# Patient Record
Sex: Male | Born: 1960 | Hispanic: Yes | Marital: Married | Smoking: Never smoker
Health system: Southern US, Community
[De-identification: ages and names within clinical notes are randomized; demographics above are authoritative.]

## PROBLEM LIST (undated history)

## (undated) DIAGNOSIS — G8929 Other chronic pain: Secondary | ICD-10-CM

## (undated) DIAGNOSIS — E875 Hyperkalemia: Secondary | ICD-10-CM

## (undated) DIAGNOSIS — E872 Acidosis: Secondary | ICD-10-CM

## (undated) DIAGNOSIS — D649 Anemia, unspecified: Secondary | ICD-10-CM

## (undated) DIAGNOSIS — Z862 Personal history of diseases of the blood and blood-forming organs and certain disorders involving the immune mechanism: Secondary | ICD-10-CM

## (undated) DIAGNOSIS — N186 End stage renal disease: Secondary | ICD-10-CM

## (undated) DIAGNOSIS — I129 Hypertensive chronic kidney disease with stage 1 through stage 4 chronic kidney disease, or unspecified chronic kidney disease: Secondary | ICD-10-CM

## (undated) DIAGNOSIS — N289 Disorder of kidney and ureter, unspecified: Secondary | ICD-10-CM

## (undated) DIAGNOSIS — N19 Unspecified kidney failure: Secondary | ICD-10-CM

## (undated) DIAGNOSIS — I10 Essential (primary) hypertension: Secondary | ICD-10-CM

## (undated) DIAGNOSIS — R81 Glycosuria: Secondary | ICD-10-CM

## (undated) HISTORY — PX: NEPHRECTOMY TRANSPLANTED ORGAN: SUR880

## (undated) HISTORY — DX: Anemia, unspecified: D64.9

---
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
## 2016-03-29 ENCOUNTER — Inpatient Hospital Stay (HOSPITAL_COMMUNITY)
Admission: EM | Admit: 2016-03-29 | Discharge: 2016-04-10 | DRG: 673 | Disposition: A | Discharge status: Home / Self Care | Payer: Medicaid Other | Attending: Internal Medicine | Admitting: Internal Medicine

## 2016-03-29 ENCOUNTER — Encounter (HOSPITAL_COMMUNITY): Payer: Self-pay | Admitting: *Deleted

## 2016-03-29 ENCOUNTER — Inpatient Hospital Stay (HOSPITAL_COMMUNITY): Payer: Medicaid Other

## 2016-03-29 ENCOUNTER — Emergency Department (HOSPITAL_COMMUNITY): Payer: Medicaid Other

## 2016-03-29 DIAGNOSIS — E611 Iron deficiency: Secondary | ICD-10-CM | POA: Diagnosis present

## 2016-03-29 DIAGNOSIS — Z452 Encounter for adjustment and management of vascular access device: Secondary | ICD-10-CM

## 2016-03-29 DIAGNOSIS — Z79899 Other long term (current) drug therapy: Secondary | ICD-10-CM | POA: Diagnosis not present

## 2016-03-29 DIAGNOSIS — N19 Unspecified kidney failure: Secondary | ICD-10-CM | POA: Diagnosis present

## 2016-03-29 DIAGNOSIS — Z862 Personal history of diseases of the blood and blood-forming organs and certain disorders involving the immune mechanism: Secondary | ICD-10-CM

## 2016-03-29 DIAGNOSIS — E872 Acidosis, unspecified: Secondary | ICD-10-CM

## 2016-03-29 DIAGNOSIS — Z992 Dependence on renal dialysis: Secondary | ICD-10-CM

## 2016-03-29 DIAGNOSIS — I10 Essential (primary) hypertension: Secondary | ICD-10-CM

## 2016-03-29 DIAGNOSIS — R55 Syncope and collapse: Secondary | ICD-10-CM | POA: Diagnosis not present

## 2016-03-29 DIAGNOSIS — N186 End stage renal disease: Secondary | ICD-10-CM | POA: Diagnosis present

## 2016-03-29 DIAGNOSIS — Z419 Encounter for procedure for purposes other than remedying health state, unspecified: Secondary | ICD-10-CM

## 2016-03-29 DIAGNOSIS — E875 Hyperkalemia: Secondary | ICD-10-CM | POA: Diagnosis present

## 2016-03-29 DIAGNOSIS — I959 Hypotension, unspecified: Secondary | ICD-10-CM | POA: Diagnosis not present

## 2016-03-29 DIAGNOSIS — I120 Hypertensive chronic kidney disease with stage 5 chronic kidney disease or end stage renal disease: Secondary | ICD-10-CM | POA: Diagnosis present

## 2016-03-29 DIAGNOSIS — N189 Chronic kidney disease, unspecified: Secondary | ICD-10-CM

## 2016-03-29 DIAGNOSIS — D631 Anemia in chronic kidney disease: Secondary | ICD-10-CM | POA: Diagnosis present

## 2016-03-29 DIAGNOSIS — N2581 Secondary hyperparathyroidism of renal origin: Secondary | ICD-10-CM | POA: Diagnosis present

## 2016-03-29 DIAGNOSIS — R569 Unspecified convulsions: Secondary | ICD-10-CM

## 2016-03-29 DIAGNOSIS — N185 Chronic kidney disease, stage 5: Secondary | ICD-10-CM | POA: Diagnosis not present

## 2016-03-29 DIAGNOSIS — N179 Acute kidney failure, unspecified: Secondary | ICD-10-CM

## 2016-03-29 HISTORY — DX: End stage renal disease: N18.6

## 2016-03-29 HISTORY — DX: Unspecified kidney failure: N19

## 2016-03-29 HISTORY — DX: Chronic kidney disease, unspecified: N18.9

## 2016-03-29 HISTORY — DX: Hyperkalemia: E87.5

## 2016-03-29 HISTORY — DX: Acidosis, unspecified: E87.20

## 2016-03-29 HISTORY — DX: Acidosis: E87.2

## 2016-03-29 HISTORY — DX: Personal history of diseases of the blood and blood-forming organs and certain disorders involving the immune mechanism: Z86.2

## 2016-03-29 HISTORY — DX: Disorder of kidney and ureter, unspecified: N28.9

## 2016-03-29 HISTORY — DX: Essential (primary) hypertension: I10

## 2016-03-29 LAB — COMPREHENSIVE METABOLIC PANEL
ALT: 21 U/L (ref 17–63)
AST: 22 U/L (ref 15–41)
Albumin: 2.8 g/dL — ABNORMAL LOW (ref 3.5–5.0)
Alkaline Phosphatase: 57 U/L (ref 38–126)
Anion gap: 10 (ref 5–15)
BUN: 144 mg/dL — ABNORMAL HIGH (ref 6–20)
CO2: 13 mmol/L — ABNORMAL LOW (ref 22–32)
Calcium: 8.3 mg/dL — ABNORMAL LOW (ref 8.9–10.3)
Chloride: 112 mmol/L — ABNORMAL HIGH (ref 101–111)
Creatinine, Ser: 11.49 mg/dL — ABNORMAL HIGH (ref 0.61–1.24)
GFR calc Af Amer: 5 mL/min — ABNORMAL LOW (ref >60)
GFR calc non Af Amer: 4 mL/min — ABNORMAL LOW (ref >60)
Glucose, Bld: 103 mg/dL — ABNORMAL HIGH (ref 65–99)
Potassium: >7.5 mmol/L (ref 3.5–5.1)
Sodium: 135 mmol/L (ref 135–145)
Total Bilirubin: 0.3 mg/dL (ref 0.3–1.2)
Total Protein: 6 g/dL — ABNORMAL LOW (ref 6.5–8.1)

## 2016-03-29 LAB — CBC WITH DIFFERENTIAL/PLATELET
Band Neutrophils: 15 %
Basophils Absolute: 0 10*3/uL (ref 0.0–0.1)
Basophils Relative: 0 %
Blasts: 0 %
Eosinophils Absolute: 0.2 10*3/uL (ref 0.0–0.7)
Eosinophils Relative: 2 %
HCT: 29.4 % — ABNORMAL LOW (ref 39.0–52.0)
Hemoglobin: 9.5 g/dL — ABNORMAL LOW (ref 13.0–17.0)
Lymphocytes Relative: 12 %
Lymphs Abs: 1 10*3/uL (ref 0.7–4.0)
MCH: 28.4 pg (ref 26.0–34.0)
MCHC: 32.3 g/dL (ref 30.0–36.0)
MCV: 88 fL (ref 78.0–100.0)
Metamyelocytes Relative: 0 %
Monocytes Absolute: 0.2 10*3/uL (ref 0.1–1.0)
Monocytes Relative: 2 %
Myelocytes: 0 %
Neutro Abs: 7.2 10*3/uL (ref 1.7–7.7)
Neutrophils Relative %: 68 %
Other: 1 %
Platelets: 247 10*3/uL (ref 150–400)
Promyelocytes Absolute: 0 %
RBC: 3.34 MIL/uL — ABNORMAL LOW (ref 4.22–5.81)
RDW: 16.1 % — ABNORMAL HIGH (ref 11.5–15.5)
WBC: 8.7 10*3/uL (ref 4.0–10.5)
nRBC: 0 /100 WBC

## 2016-03-29 LAB — I-STAT CHEM 8, ED
BUN: >140 mg/dL — ABNORMAL HIGH (ref 6–20)
Calcium, Ion: 1.33 mmol/L (ref 1.15–1.40)
Chloride: 118 mmol/L — ABNORMAL HIGH (ref 101–111)
Creatinine, Ser: 12.5 mg/dL — ABNORMAL HIGH (ref 0.61–1.24)
Glucose, Bld: 103 mg/dL — ABNORMAL HIGH (ref 65–99)
HCT: 26 % — ABNORMAL LOW (ref 39.0–52.0)
Hemoglobin: 8.8 g/dL — ABNORMAL LOW (ref 13.0–17.0)
Potassium: 7.4 mmol/L (ref 3.5–5.1)
Sodium: 136 mmol/L (ref 135–145)
TCO2: 13 mmol/L (ref 0–100)

## 2016-03-29 LAB — I-STAT CG4 LACTIC ACID, ED
Lactic Acid, Venous: 0.36 mmol/L — ABNORMAL LOW (ref 0.5–1.9)
Lactic Acid, Venous: 0.4 mmol/L — ABNORMAL LOW (ref 0.5–1.9)

## 2016-03-29 LAB — SODIUM, URINE, RANDOM: Sodium, Ur: 50 mmol/L

## 2016-03-29 LAB — CREATININE, URINE, RANDOM: Creatinine, Urine: 49.87 mg/dL

## 2016-03-29 MED ORDER — HEPARIN SODIUM (PORCINE) 1000 UNIT/ML IJ SOLN
2.4000 mL | Freq: Once | INTRAMUSCULAR | Status: AC
Start: 2016-03-29 — End: 2016-03-30
  Administered 2016-03-30: 2400 [IU] via INTRAVENOUS
  Filled 2016-03-29: qty 2.4

## 2016-03-29 MED ORDER — CALCIUM CARBONATE ANTACID 500 MG PO CHEW
1.0000 | CHEWABLE_TABLET | Freq: Every day | ORAL | Status: DC | PRN
  Administered 2016-03-31: 200 mg via ORAL
  Filled 2016-03-29 (×2): qty 1

## 2016-03-29 MED ORDER — DEXTROSE 50 % IV SOLN
1.0000 | Freq: Once | INTRAVENOUS | Status: AC
  Administered 2016-03-29: 50 mL via INTRAVENOUS
  Filled 2016-03-29: qty 50

## 2016-03-29 MED ORDER — SODIUM CHLORIDE 0.9 % IV SOLN: 100.0000 mL | INTRAVENOUS | Status: DC | PRN

## 2016-03-29 MED ORDER — ALTEPLASE 2 MG IJ SOLR: 2.0000 mg | Freq: Once | INTRAMUSCULAR | Status: DC | PRN

## 2016-03-29 MED ORDER — LIDOCAINE-PRILOCAINE 2.5-2.5 % EX CREA: 1.0000 "application " | TOPICAL_CREAM | CUTANEOUS | Status: DC | PRN

## 2016-03-29 MED ORDER — INSULIN ASPART 100 UNIT/ML IV SOLN
10.0000 [IU] | Freq: Once | INTRAVENOUS | Status: AC
  Administered 2016-03-29: 10 [IU] via INTRAVENOUS
  Filled 2016-03-29: qty 1

## 2016-03-29 MED ORDER — NIFEDIPINE ER 60 MG PO TB24
60.0000 mg | ORAL_TABLET | Freq: Every day | ORAL | Status: DC
  Administered 2016-03-30 – 2016-03-31 (×2): 60 mg via ORAL
  Filled 2016-03-29 (×3): qty 1

## 2016-03-29 MED ORDER — LIDOCAINE HCL (PF) 1 % IJ SOLN: 5.0000 mL | INTRAMUSCULAR | Status: DC | PRN

## 2016-03-29 MED ORDER — SODIUM POLYSTYRENE SULFONATE 15 GM/60ML PO SUSP
30.0000 g | ORAL | Status: AC
  Administered 2016-03-29: 30 g via ORAL
  Filled 2016-03-29: qty 120

## 2016-03-29 MED ORDER — ALBUTEROL SULFATE (2.5 MG/3ML) 0.083% IN NEBU
10.0000 mg | INHALATION_SOLUTION | Freq: Once | RESPIRATORY_TRACT | Status: AC
  Administered 2016-03-29: 10 mg via RESPIRATORY_TRACT
  Filled 2016-03-29: qty 12

## 2016-03-29 MED ORDER — HEPARIN SODIUM (PORCINE) 5000 UNIT/ML IJ SOLN
5000.0000 [IU] | Freq: Three times a day (TID) | INTRAMUSCULAR | Status: DC
  Administered 2016-03-30 – 2016-04-09 (×27): 5000 [IU] via SUBCUTANEOUS
  Filled 2016-03-29 (×24): qty 1

## 2016-03-29 MED ORDER — CARVEDILOL 6.25 MG PO TABS
6.2500 mg | ORAL_TABLET | Freq: Three times a day (TID) | ORAL | Status: DC
  Administered 2016-03-30 – 2016-03-31 (×4): 6.25 mg via ORAL
  Filled 2016-03-29 (×3): qty 1

## 2016-03-29 MED ORDER — PENTAFLUOROPROP-TETRAFLUOROETH EX AERO: 1.0000 "application " | INHALATION_SPRAY | CUTANEOUS | Status: DC | PRN

## 2016-03-29 MED ORDER — HEPARIN SODIUM (PORCINE) 1000 UNIT/ML DIALYSIS
1000.0000 [IU] | INTRAMUSCULAR | Status: DC | PRN
  Filled 2016-03-29: qty 1

## 2016-03-29 MED ORDER — FUROSEMIDE 10 MG/ML IJ SOLN
120.0000 mg | INTRAMUSCULAR | Status: AC
  Administered 2016-03-29: 120 mg via INTRAVENOUS
  Filled 2016-03-29: qty 12

## 2016-03-29 MED ORDER — SODIUM CHLORIDE 0.9 % IV SOLN
1.0000 g | Freq: Once | INTRAVENOUS | Status: AC
  Administered 2016-03-29: 1 g via INTRAVENOUS
  Filled 2016-03-29: qty 10

## 2016-03-29 NOTE — Consult Note (Signed)
[facility] Consult Note     Date: 03/29/2016                  Patient Name:  Dylan [name]  MRN: [id number]  DOB: 1961-04-30  Age / Sex: 55 y.o., male         PCP: No primary care provider on file.                 Service Requesting Consult: ED- Dr. Ron Parker                 Reason for Consult: Hyperkalemia and CKD            Chief Complaint: sore throat and cough  HPI: Pt is a 31M with a PMH significant for HTN and CKD GV who is now seen in consultation at the request of Dr. Ron Parker of the ED for evaluation and management of progressive CKD and hyperkalemia.  Pt is originally from [place] and is here on vacation with his family, arrived on Nov 20th planning to leave on Dec 7th.  [name] is a physician (pediatrician) and helps provide the history.  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  He had an acute illness in February (in [place]) with fever, vomiting, and diarrhea.  He went to the hospital and was told he needed dialysis.  He had two dialysis treatments and recovered enough renal function so that he didn't need permanent dialysis.  He was told to get a fistula placement but he wasn't able to find all of the supplies needed to schedule the surgery (pts need to find their own supplies for surgical/ medical procedures according to dtr).    He has been feeling well with no change in urination.  Two days ago he reported a cough and sore throat and so has been getting amoxicillin 500 mg daily.  He has noted no change in his urination.  Dtr has noted perhaps a small change in appetite and also noted that he has been a little more sleepy than usual.    Dtr is very concerned about him needing dialysis again.  She reports he had a seizure after his first dialysis treatment in [place].  She knows it was not a full session; she thinks it may have been a 30 minute session.    Pt does not report Ha, CP SOB n/v or diarrhea.  He gets EPO 40,000 u weekly, last  received yesterday.   Past Medical History:  Diagnosis Date  . Hypertension   . Renal disorder     History reviewed. No pertinent surgical history.  History reviewed. No pertinent family history. Social History:  reports that he has never smoked. He does not have any smokeless tobacco history on file. He reports that he does not drink alcohol or use drugs.  Allergies: No Known Allergies  MEDICATIONS: list reviewed with dtr Nifedipine 60 mg BID olmesartan 40 mg q AM Furosemide 20 mg q AM Carvedilol 6.25 mg TID TUMS with every meal EPO 40,000 u q week Amoxicillin 500 mg q day (started 2 days ago) Folic acid and vitamin supplements  (Not in a hospital admission)  Results for orders placed or performed during the hospital encounter of 03/29/16 (from the past 48 hour(s))  Comprehensive metabolic panel     Status: Abnormal   Collection Time: 03/29/16  4:25 PM  Result Value Ref Range   Sodium 135 135 - 145 mmol/L   Potassium >7.5 (  HH) 3.5 - 5.1 mmol/L    Comment: NO VISIBLE HEMOLYSIS CRITICAL RESULT CALLED TO, READ BACK BY AND VERIFIED WITH: J BLUE,RN 1712 03/29/16 D BRADLEY    Chloride 112 (H) 101 - 111 mmol/L   CO2 13 (L) 22 - 32 mmol/L   Glucose, Bld 103 (H) 65 - 99 mg/dL   BUN 144 (H) 6 - 20 mg/dL   Creatinine, Ser 11.49 (H) 0.61 - 1.24 mg/dL   Calcium 8.3 (L) 8.9 - 10.3 mg/dL   Total Protein 6.0 (L) 6.5 - 8.1 g/dL   Albumin 2.8 (L) 3.5 - 5.0 g/dL   AST 22 15 - 41 U/L   ALT 21 17 - 63 U/L   Alkaline Phosphatase 57 38 - 126 U/L   Total Bilirubin 0.3 0.3 - 1.2 mg/dL   GFR calc non Af Amer 4 (L) >60 mL/min   GFR calc Af Amer 5 (L) >60 mL/min    Comment: (NOTE) The eGFR has been calculated using the CKD EPI equation. This calculation has not been validated in all clinical situations. eGFR's persistently <60 mL/min signify possible Chronic Kidney Disease.    Anion gap 10 5 - 15  CBC with Differential     Status: Abnormal   Collection Time: 03/29/16  4:25 PM  Result  Value Ref Range   WBC 8.7 4.0 - 10.5 K/uL   RBC 3.34 (L) 4.22 - 5.81 MIL/uL   Hemoglobin 9.5 (L) 13.0 - 17.0 g/dL   HCT 29.4 (L) 39.0 - 52.0 %   MCV 88.0 78.0 - 100.0 fL   MCH 28.4 26.0 - 34.0 pg   MCHC 32.3 30.0 - 36.0 g/dL   RDW 16.1 (H) 11.5 - 15.5 %   Platelets 247 150 - 400 K/uL   Neutrophils Relative % 68 %   Lymphocytes Relative 12 %   Monocytes Relative 2 %   Eosinophils Relative 2 %   Basophils Relative 0 %   Band Neutrophils 15 %   Metamyelocytes Relative 0 %   Myelocytes 0 %   Promyelocytes Absolute 0 %   Blasts 0 %   nRBC 0 0 /100 WBC   Other 1 %   Neutro Abs 7.2 1.7 - 7.7 K/uL   Lymphs Abs 1.0 0.7 - 4.0 K/uL   Monocytes Absolute 0.2 0.1 - 1.0 K/uL   Eosinophils Absolute 0.2 0.0 - 0.7 K/uL   Basophils Absolute 0.0 0.0 - 0.1 K/uL   RBC Morphology POLYCHROMASIA PRESENT     Comment: BURR CELLS ELLIPTOCYTES    WBC Morphology PELGEROID CHANGES   I-Stat CG4 Lactic Acid, ED     Status: Abnormal   Collection Time: 03/29/16  4:35 PM  Result Value Ref Range   Lactic Acid, Venous 0.36 (L) 0.5 - 1.9 mmol/L  I-Stat Chem 8, ED     Status: Abnormal   Collection Time: 03/29/16  6:34 PM  Result Value Ref Range   Sodium 136 135 - 145 mmol/L   Potassium 7.4 (HH) 3.5 - 5.1 mmol/L   Chloride 118 (H) 101 - 111 mmol/L   BUN >140 (H) 6 - 20 mg/dL   Creatinine, Ser 12.50 (H) 0.61 - 1.24 mg/dL   Glucose, Bld 103 (H) 65 - 99 mg/dL   Calcium, Ion 1.33 1.15 - 1.40 mmol/L   TCO2 13 0 - 100 mmol/L   Hemoglobin 8.8 (L) 13.0 - 17.0 g/dL   HCT 26.0 (L) 39.0 - 52.0 %   Comment NOTIFIED PHYSICIAN   Creatinine, urine, random  Status: None   Collection Time: 03/29/16  6:39 PM  Result Value Ref Range   Creatinine, Urine 49.87 mg/dL  Sodium, urine, random     Status: None   Collection Time: 03/29/16  6:39 PM  Result Value Ref Range   Sodium, Ur 50 mmol/L   Dg Chest 2 View  Result Date: 03/29/2016 CLINICAL DATA:  Cough, fatigue, loss of appetite and fever for 3 days, history  hypertension, renal disorder EXAM: CHEST  2 VIEW COMPARISON:  None FINDINGS: Enlargement of cardiac silhouette. Mediastinal contours and pulmonary vascularity normal. Minimal peribronchial thickening. No pulmonary infiltrate, pleural effusion, or pneumothorax. Osseous structures unremarkable. IMPRESSION: Enlargement of cardiac silhouette. Minimal bronchitic change without infiltrate. Electronically Signed   By: Lavonia Dana M.D.   On: 03/29/2016 16:58    ROS: all other systems reviewed and are negative except as per HPI  Blood pressure 144/82, pulse 78, temperature 98.2 F (36.8 C), temperature source Oral, resp. rate 18, weight 88.5 kg (195 lb), SpO2 99 %. Physical Exam  GEN: NAD, appears stated age 37: EOMI, PERRL, mucous membranes well-hydrated, some erythematous posterior oropharynx NECK: Supple, + JVD PULM: normal work of breathing, bilateral basilar inspiratory crackles, L > R with some L-sided rhonchi as well.   CV RRR, II/VI systolic murmur, no r/g ABD soft, nontender, no organomegaly appreciated EXT trace LE edema NEURO: AAO x 3, no asterixis   Assessment/Plan Pt is a 71M with a PMH significant for HTN and CKD GV now seen for progressive CKD and hyperkalemia.     1.  Hyperkalemia: K > 7.5, came down to 7.4 (iStat) after calcium, insulin/dextrose, and continuous albuterol neb.  Unfortunately pt was still on ARB.  Wonder if acute illness precipitated event vs progression of CKD.  Giving IV Lasix and Kayexelate, recheck potassium at 2300.  I am concerned about dialysis disequilibrium and precipitating another seizure since he had one after his first dialysis session last time--> do not know the exact circumstances.  Will try to augment medical management--> induce kaliuresis with Lasix and eliminate it through the stool with Kayexelate so when we do have to dialyze, time on machine for first session (and therefore risk of dialysis disequilibrium) is minimized.  CCM c/s for HD cath  placement, appreciate assistance.  2.  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  I believe that perhaps his appetite loss and sleepiness are in fact mild uremic symptoms which were exacerbated by sore throat/ fevers.  Has essentially progressed to ESRD.  Will need vein mapping (ordered), fistula placement, and permcath--> will be logistically tricky getting him an HD center at home.    3.  BMMD: takes TUMS with each meal- unsure if extra strength.  Adding on phos and PTH.  4.  Anemia: Hgb 9.5, received EPO 40,000 u yesterday.  Adding on iron and ferritin.  5.  Fevers/ sore throat: ? Strep, getting amoxicillin.  Consider throat culture, does not appear to be infiltrate on CXR  6.  HTN: on carvedilol 6.25 TID (confirmed with dtr), nifedipine 60 mg BID, Lasix 20 mg daily, and olmesartan.  Do not recommend restarting olmesartan. BP OK here, 144/82    Madelon Lips MD [facility] pgr [phone] 03/29/2016, 7:31 PM

## 2016-03-29 NOTE — ED Provider Notes (Signed)
[facility] DEPT Provider Note   CSN: [id number] Arrival date & time: 03/29/16  1602     History   Chief Complaint Chief Complaint  Patient presents with  . Fever  . Cough    HPI Dylan [name] is a 55 y.o. male.   Illness  This is a new problem. The current episode started 2 days ago. The problem occurs constantly. The problem has not changed since onset.Associated symptoms include shortness of breath. Pertinent negatives include no chest pain, no abdominal pain and no headaches. Associated symptoms comments: Coughing and fatigue. The symptoms are aggravated by exertion and coughing. Nothing relieves the symptoms. He has tried nothing for the symptoms. The treatment provided no relief.    Past Medical History:  Diagnosis Date  . Hypertension   . Renal disorder     Patient Active Problem List   Diagnosis Date Noted  . Hyperkalemia, diminished renal excretion 03/29/2016  . ESRD (end stage renal disease) ([place]) 03/29/2016  . Uremia of renal origin 03/29/2016  . HTN (hypertension) 03/29/2016  . 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 History of anemia due to CKD 03/29/2016    History reviewed. No pertinent surgical history.     Home Medications    Prior to Admission medications   Medication Sig Start Date End Date Taking? Authorizing Provider  calcium carbonate (TUMS - DOSED IN MG ELEMENTAL CALCIUM) 500 MG chewable tablet Chew 1 tablet by mouth daily as needed for indigestion or heartburn.   Yes Historical Provider, MD  carvedilol (COREG) 6.25 MG tablet Take 6.25 mg by mouth 3 (three) times daily with meals.   Yes Historical Provider, MD  epoetin alfa (EPOGEN,PROCRIT) 4000 UNIT/ML injection Inject 4,000 Units into the skin once a week. TUESDAYS   Yes Historical Provider, MD  furosemide (LASIX) 20 MG tablet Take 20 mg by mouth.   Yes Historical Provider, MD  NIFEdipine (PROCARDIA-XL/ADALAT CC) 60 MG 24 hr tablet Take 60 mg by mouth  daily.   Yes Historical Provider, MD  NON FORMULARY Take 2 capsules by mouth 2 (two) times daily.   Yes Historical Provider, MD    Family History History reviewed. No pertinent family history.  Social History Social History  Substance Use Topics  . Smoking status: Never Smoker  . Smokeless tobacco: Not on file  . Alcohol use No     Allergies   Patient has no known allergies.   Review of Systems Review of Systems  Constitutional: Positive for chills, fatigue and fever.  HENT: Negative for ear pain and sore throat.   Eyes: Negative for pain and visual disturbance.  Respiratory: Positive for cough and shortness of breath.   Cardiovascular: Negative for chest pain and palpitations.  Gastrointestinal: Negative for abdominal pain and vomiting.  Genitourinary: Negative for dysuria and hematuria.  Musculoskeletal: Negative for arthralgias and back pain.  Skin: Negative for color change and rash.  Neurological: Negative for seizures, syncope and headaches.  All other systems reviewed and are negative.    Physical Exam Updated Vital Signs BP 158/75   Pulse 97   Temp 98.2 F (36.8 C) (Oral)   Resp 19   Wt 88.5 kg   SpO2 100%   Physical Exam  Constitutional: He appears well-developed and well-nourished.  HENT:  Head: Normocephalic and atraumatic.  Eyes: Conjunctivae are normal.  Neck: Neck supple.  Cardiovascular: Normal rate and regular rhythm.   No murmur heard. Pulmonary/Chest: Effort normal. No respiratory distress. He has wheezes (diffuse). He  has no rales. He exhibits no tenderness.  Abdominal: Soft. There is no tenderness.  Musculoskeletal: Normal range of motion. He exhibits no edema or deformity.  Neurological: He is alert.  Skin: Skin is warm and dry. Capillary refill takes less than 2 seconds.  Psychiatric: He has a normal mood and affect.  Nursing note and vitals reviewed.    ED Treatments / Results  Labs (all labs ordered are listed, but only  abnormal results are displayed) Labs Reviewed  COMPREHENSIVE METABOLIC PANEL - Abnormal; Notable for the following:       Result Value   Potassium >7.5 (*)    Chloride 112 (*)    CO2 13 (*)    Glucose, Bld 103 (*)    BUN 144 (*)    Creatinine, Ser 11.49 (*)    Calcium 8.3 (*)    Total Protein 6.0 (*)    Albumin 2.8 (*)    GFR calc non Af Amer 4 (*)    GFR calc Af Amer 5 (*)    All other components within normal limits  CBC WITH DIFFERENTIAL/PLATELET - Abnormal; Notable for the following:    RBC 3.34 (*)    Hemoglobin 9.5 (*)    HCT 29.4 (*)    RDW 16.1 (*)    All other components within normal limits  I-STAT CG4 LACTIC ACID, ED - Abnormal; Notable for the following:    Lactic Acid, Venous 0.36 (*)    All other components within normal limits  I-STAT CHEM 8, ED - Abnormal; Notable for the following:    Potassium 7.4 (*)    Chloride 118 (*)    BUN >140 (*)    Creatinine, Ser 12.50 (*)    Glucose, Bld 103 (*)    Hemoglobin 8.8 (*)    HCT 26.0 (*)    All other components within normal limits  I-STAT CG4 LACTIC ACID, ED - Abnormal; Notable for the following:    Lactic Acid, Venous 0.40 (*)    All other components within normal limits  RAPID STREP SCREEN (NOT AT [facility])  CREATININE, URINE, RANDOM  SODIUM, URINE, RANDOM  BASIC METABOLIC PANEL  PARATHYROID HORMONE, INTACT (NO CA)  PHOSPHORUS  IRON AND TIBC  FERRITIN  HEPATITIS B SURFACE ANTIBODY  HEPATITIS B SURFACE ANTIGEN  HEPATITIS B CORE ANTIBODY, TOTAL    EKG  EKG Interpretation  Date/Time:  Wednesday March 29 2016 18:16:21 EST Ventricular Rate:  90 PR Interval:    QRS Duration: 96 QT Interval:  338 QTC Calculation: 414 R Axis:   -6 Text Interpretation:  Sinus rhythm Probable left atrial enlargement Minimal ST elevation, anterior leads Peaked T waves Abnormal ECG Confirmed by Sherry Ruffing MD, [place] [phone]) on 03/29/2016 11:14:50 PM       Radiology Dg Chest 2 View  Result Date: 03/29/2016 CLINICAL  DATA:  Cough, fatigue, loss of appetite and fever for 3 days, history hypertension, renal disorder EXAM: CHEST  2 VIEW COMPARISON:  None FINDINGS: Enlargement of cardiac silhouette. Mediastinal contours and pulmonary vascularity normal. Minimal peribronchial thickening. No pulmonary infiltrate, pleural effusion, or pneumothorax. Osseous structures unremarkable. IMPRESSION: Enlargement of cardiac silhouette. Minimal bronchitic change without infiltrate. Electronically Signed   By: Lavonia Dana M.D.   On: 03/29/2016 16:58   Dg Chest Port 1 View  Result Date: 03/29/2016 CLINICAL DATA:  Central line placement.  Initial encounter. EXAM: PORTABLE CHEST 1 VIEW COMPARISON:  Chest radiograph performed earlier today at 4:36 p.m. FINDINGS: The patient's right IJ line is noted  ending about the mid SVC. The lungs are well-aerated. Pulmonary vascularity is at the upper limits of normal. There is no evidence of focal opacification, pleural effusion or pneumothorax. The cardiomediastinal silhouette is borderline normal in size. No acute osseous abnormalities are seen. IMPRESSION: 1. Right IJ line noted ending about the mid SVC. 2. No acute cardiopulmonary process seen. Electronically Signed   By: Garald Balding M.D.   On: 03/29/2016 22:39    Procedures Procedures (including critical care time)  Medications Ordered in ED Medications  pentafluoroprop-tetrafluoroeth (GEBAUERS) aerosol 1 application (not administered)  lidocaine (PF) (XYLOCAINE) 1 % injection 5 mL (not administered)  lidocaine-prilocaine (EMLA) cream 1 application (not administered)  0.9 %  sodium chloride infusion (not administered)  0.9 %  sodium chloride infusion (not administered)  heparin injection 1,000 Units (not administered)  alteplase (CATHFLO ACTIVASE) injection 2 mg (not administered)  heparin injection 5,000 Units (not administered)  NIFEdipine (PROCARDIA-XL/ADALAT CC) 24 hr tablet 60 mg (not administered)  carvedilol (COREG) tablet  6.25 mg (not administered)  calcium carbonate (TUMS - dosed in mg elemental calcium) chewable tablet 200 mg of elemental calcium (not administered)  heparin injection 2,400 Units (not administered)  calcium chloride 1 g in sodium chloride 0.9 % 100 mL IVPB (0 g Intravenous Stopped 03/29/16 1809)  albuterol (PROVENTIL) (2.5 MG/3ML) 0.083% nebulizer solution 10 mg (10 mg Nebulization Given 03/29/16 1740)  insulin aspart (novoLOG) injection 10 Units (10 Units Intravenous Given 03/29/16 1752)  dextrose 50 % solution 50 mL (50 mLs Intravenous Given 03/29/16 1752)  furosemide (LASIX) 120 mg in dextrose 5 % 50 mL IVPB (0 mg Intravenous Stopped 03/29/16 2311)  sodium polystyrene (KAYEXALATE) 15 GM/60ML suspension 30 g (30 g Oral Given 03/29/16 2205)     Initial Impression / Assessment and Plan / ED Course  I have reviewed the triage vital signs and the nursing notes.  Pertinent labs & imaging results that were available during my care of the patient were reviewed by me and considered in my medical decision making (see chart for details).  Clinical Course    55 year old gentleman comes today with complaint of weakness diffusely fevers chills or URI type symptoms for last few days. He has a history of CK 85 having had acute renal failure in the past. Today he has EKG that shows peak T waves and serum potassium that is greater than 7.5. He is immediately brought back IV access was obtained and patient is given IV calcium IV glucose and insulin as well as albuterol. Nephrology is consulted for acute on chronic renal failure possible need for dialysis. Of note the patient's denying any shortness of breath or chest pain at this time. Initial troponin is negative chest x-ray is unremarkable for any acute cardiopulmonary pathology. Nephrology's recommendation is for IV Lasix and by mouth Kayexalate. He will be admitted to stepdown unit for further management of his hyperkalemia in the setting of acute on chronic  renal failure. Vital signs stable time handoff. Further made in this patient's care please see inpatient team notes.  Final Clinical Impressions(s) / ED Diagnoses   Final diagnoses:  Hyperkalemia  Acute renal failure, unspecified acute renal failure type [facility])    New Prescriptions New Prescriptions   No medications on file     Dewaine Conger, MD 03/29/16 [place], MD 03/30/16 0973

## 2016-03-29 NOTE — Procedures (Signed)
Hemodialysis Insertion Procedure Note Dylan [name] [id number] 12/13/1960  Procedure: Insertion of Hemodialysis Catheter Type: 3 port  Indications: Hemodialysis   Procedure Details Consent: Risks of procedure as well as the alternatives and risks of each were explained to the (patient/caregiver).  Consent for procedure obtained. From daughter who is a physician in [place] and only [place] speaking member of family. She informed her father who was had this done once before. Time Out: Verified patient identification, verified procedure, site/side was marked, verified correct patient position, special equipment/implants available, medications/allergies/relevent history reviewed, required imaging and test results available.  Performed  Maximum sterile technique was used including antiseptics, cap, gloves, gown, hand hygiene, mask and sheet. Skin prep: Chlorhexidine; local anesthetic administered A antimicrobial bonded/coated triple lumen catheter was placed in the right internal jugular vein using the Seldinger technique. Ultrasound guidance used.Yes.   Catheter placed to 16 cm. Blood aspirated via all 3 ports and then flushed x 3. Line sutured x 2 and dressing applied.  Evaluation Blood flow good Complications: No apparent complications Patient did tolerate procedure well. Chest X-ray ordered to verify placement.  CXR: pending.  Georgann Housekeeper, AGACNP-BC [facility] Pulmonology/Critical Care Pager [phone] or [phone]  03/29/2016 10:05 PM

## 2016-03-29 NOTE — ED Notes (Signed)
Potassium called 7.5 by lab. Tyrone Nine MD notified

## 2016-03-29 NOTE — ED Triage Notes (Signed)
Pt and family member reports pt having fever, sore throat, fatigue and cough since Sunday night.

## 2016-03-29 NOTE — ED Notes (Signed)
EKG given to Dr Tegeler. 

## 2016-03-29 NOTE — H&P (Signed)
History and Physical    Dylan [name] VFI:[id number] DOB: November 27, 1960 DOA: 03/29/2016   PCP: No primary care provider on file. Chief Complaint:  Chief Complaint  Patient presents with  . Fever  . Cough    HPI: Dylan [name] is a 55 y.o. male with medical history significant of HTN, and what sounds like CKD stage 5 at baseline, has had dialysis 2x previously in [place] but was not put on chronic dialysis.  He is here on vacation with family.  Arrived on Nov 20th and planning to leave Dec 7th although his [name] is for 6 months (can legally remain in [place] for that duration).  [name] is a physician (pediatrician) and helps provide the history.  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  He had an acute illness in February (in [place]) with fever, vomiting, and diarrhea.  He went to the hospital and was told he needed dialysis.  He had two dialysis treatments and recovered enough renal function so that he didn't need permanent dialysis.  He was told to get a fistula placement but he wasn't able to find all of the supplies needed to schedule the surgery (pts need to find their own supplies for surgical/ medical procedures according to dtr).    He has been feeling well with no change in urination.  Two days ago he reported a cough and sore throat and so has been getting amoxicillin 500 mg daily.  He has noted no change in his urination.  Dtr has noted perhaps a small change in appetite and also noted that he has been a little more sleepy than usual.    Dtr is very concerned about him needing dialysis again.  She reports he had a seizure after his first dialysis treatment in [place].  She knows it was not a full session; she thinks it may have been a 30 minute session.    Pt does not report Ha, CP SOB n/v or diarrhea.  He gets EPO 40,000 u weekly, last received yesterday.  On further discussion with daughter and family, they acknowledge that due to current financial / social /  political situation in [place], getting dialysis routinely there isnt really feasible.  ED Course: K > 7.5, came down to 7.4 with treatment.  Nephrology evaluating at bedside in ED.  BUN 144.  Creat 12.5  Review of Systems: As per HPI otherwise 10 point review of systems negative.    Past Medical History:  Diagnosis Date  . Hypertension   . Renal disorder     History reviewed. No pertinent surgical history.   reports that he has never smoked. He does not have any smokeless tobacco history on file. He reports that he does not drink alcohol or use drugs.  No Known Allergies  History reviewed. No pertinent family history. No h/o ESRD.   Prior to Admission medications   Medication Sig Start Date End Date Taking? Authorizing Provider  calcium carbonate (TUMS - DOSED IN MG ELEMENTAL CALCIUM) 500 MG chewable tablet Chew 1 tablet by mouth daily as needed for indigestion or heartburn.   Yes Historical Provider, MD  carvedilol (COREG) 6.25 MG tablet Take 6.25 mg by mouth 3 (three) times daily with meals.   Yes Historical Provider, MD  epoetin alfa (EPOGEN,PROCRIT) 4000 UNIT/ML injection Inject 4,000 Units into the skin once a week. TUESDAYS   Yes Historical Provider, MD  furosemide (LASIX) 20 MG tablet Take 20 mg by mouth.   Yes Historical  Provider, MD  NIFEdipine (PROCARDIA-XL/ADALAT CC) 60 MG 24 hr tablet Take 60 mg by mouth daily.   Yes Historical Provider, MD  NON FORMULARY Take 2 capsules by mouth 2 (two) times daily.   Yes Historical Provider, MD    Physical Exam: Vitals:   03/29/16 1740 03/29/16 1900 03/29/16 1915 03/29/16 1930  BP:  167/89 156/85 160/91  Pulse:  92 87 88  Resp:  21 17 19   Temp:      TempSrc:      SpO2: 99% 99% 97% 99%  Weight:          Constitutional: NAD, calm, comfortable Eyes: PERRL, lids and conjunctivae normal ENMT: Mucous membranes are moist. Posterior pharynx clear of any exudate or lesions.Normal dentition.  Neck: normal, supple, no masses, no  thyromegaly Respiratory: clear to auscultation bilaterally, no wheezing, no crackles. Normal respiratory effort. No accessory muscle use.  Cardiovascular: Regular rate and rhythm, no murmurs / rubs / gallops. No extremity edema. 2+ pedal pulses. No carotid bruits.  Abdomen: no tenderness, no masses palpated. No hepatosplenomegaly. Bowel sounds positive.  Musculoskeletal: no clubbing / cyanosis. No joint deformity upper and lower extremities. Good ROM, no contractures. Normal muscle tone.  Skin: no rashes, lesions, ulcers. No induration Neurologic: CN 2-12 grossly intact. Sensation intact, DTR normal. Strength 5/5 in all 4.  Psychiatric: Normal judgment and insight. Alert and oriented x 3. Normal mood.    Labs on Admission: I have personally reviewed following labs and imaging studies  CBC:  Recent Labs Lab 03/29/16 1625 03/29/16 1834  WBC 8.7  --   NEUTROABS 7.2  --   HGB 9.5* 8.8*  HCT 29.4* 26.0*  MCV 88.0  --   PLT 247  --    Basic Metabolic Panel:  Recent Labs Lab 03/29/16 1625 03/29/16 1834  NA 135 136  K >7.5* 7.4*  CL 112* 118*  CO2 13*  --   GLUCOSE 103* 103*  BUN 144* >140*  CREATININE 11.49* 12.50*  CALCIUM 8.3*  --    GFR: CrCl cannot be calculated (Unknown ideal weight.). Liver Function Tests:  Recent Labs Lab 03/29/16 1625  AST 22  ALT 21  ALKPHOS 57  BILITOT 0.3  PROT 6.0*  ALBUMIN 2.8*   No results for input(s): LIPASE, AMYLASE in the last 168 hours. No results for input(s): AMMONIA in the last 168 hours. Coagulation Profile: No results for input(s): INR, PROTIME in the last 168 hours. Cardiac Enzymes: No results for input(s): CKTOTAL, CKMB, CKMBINDEX, TROPONINI in the last 168 hours. BNP (last 3 results) No results for input(s): PROBNP in the last 8760 hours. HbA1C: No results for input(s): HGBA1C in the last 72 hours. CBG: No results for input(s): GLUCAP in the last 168 hours. Lipid Profile: No results for input(s): CHOL, HDL,  LDLCALC, TRIG, CHOLHDL, LDLDIRECT in the last 72 hours. Thyroid Function Tests: No results for input(s): TSH, T4TOTAL, FREET4, T3FREE, THYROIDAB in the last 72 hours. Anemia Panel: No results for input(s): VITAMINB12, FOLATE, FERRITIN, TIBC, IRON, RETICCTPCT in the last 72 hours. Urine analysis: No results found for: COLORURINE, APPEARANCEUR, LABSPEC, PHURINE, GLUCOSEU, HGBUR, BILIRUBINUR, KETONESUR, PROTEINUR, UROBILINOGEN, NITRITE, LEUKOCYTESUR Sepsis Labs: @LABRCNTIP (procalcitonin:4,lacticidven:4) )No results found for this or any previous visit (from the past 240 hour(s)).   Radiological Exams on Admission: Dg Chest 2 View  Result Date: 03/29/2016 CLINICAL DATA:  Cough, fatigue, loss of appetite and fever for 3 days, history hypertension, renal disorder EXAM: CHEST  2 VIEW COMPARISON:  None FINDINGS: Enlargement of  cardiac silhouette. Mediastinal contours and pulmonary vascularity normal. Minimal peribronchial thickening. No pulmonary infiltrate, pleural effusion, or pneumothorax. Osseous structures unremarkable. IMPRESSION: Enlargement of cardiac silhouette. Minimal bronchitic change without infiltrate. Electronically Signed   By: Lavonia Dana M.D.   On: 03/29/2016 16:58    EKG: Independently reviewed.  Assessment/Plan Principal Problem:   Hyperkalemia, diminished renal excretion Active Problems:   ESRD (end stage renal disease) (HCC)   Uremia of renal origin   HTN (hypertension)   Metabolic acidosis, NAG, failure of bicarbonate regeneration   History of anemia due to CKD    1. Hyperkalemia, uremia, and ESRD - 1. Getting kayexelate and lasix in ED 2. Repeat BMP at 2300 3. PCCM to put in vas-cath 4. Likely will end up needing dialysis (kayexelate and lasix is to "augment" management medically, see nephro consult note) 5. Call vascular surgery in AM for vein mapping / fistula creation 6. Phos and PTH ordered 2. Anemia of CKD - epogyn injections in Tuesdays 3. HTN  - 1. continue home beta blocker and CCB 2. Discontinuing ARB due to ESRD 4. NAG metabolic acidosis - 1. due to CKD 2. dont really want to do a bicarb gtt since he is already borderline fluid overloaded 3. Probably going to get dialysis 5. Sore throat - will get strep test.  Discussion: bigger long term concern for this patient who is essentially a patient with ESRD who will almost certainly require routine dialysis in future, is access to medical care in his home country of [place].  Currently social / political / financial / medical situation in [place] is difficult and would likely make it impossible for patient to get access to routine and medically required dialysis treatments to save his life.  As a result, going back to [place] in the near future will likely be terminal to the patient, and as such I have involved SW to see if they can get the ball rolling on some sort of "medical asylum" for the patient.   DVT prophylaxis: Heparin Code Status: Full Family Communication: Daughter at bedside who acts as Optometrist, she is also a Lexicographer from [place] Consults called: Nephrology consult already in chart, they have spoken to PCCM about vas-cath placement, also I spoke with SW Admission status: Admit to inpatient   Etta Quill DO [facility] Pager [phone] from 7PM-7AM  If 7AM-7PM, please contact the day physician for the patient [email or web address] Password [email or web address]  03/29/2016, 9:08 PM

## 2016-03-30 ENCOUNTER — Encounter (HOSPITAL_COMMUNITY): Payer: Self-pay | Admitting: Cardiology

## 2016-03-30 LAB — COMPREHENSIVE METABOLIC PANEL
ALT: 21 U/L (ref 17–63)
AST: 22 U/L (ref 15–41)
Albumin: 2.7 g/dL — ABNORMAL LOW (ref 3.5–5.0)
Alkaline Phosphatase: 49 U/L (ref 38–126)
Anion gap: 12 (ref 5–15)
BUN: 147 mg/dL — ABNORMAL HIGH (ref 6–20)
CO2: 12 mmol/L — ABNORMAL LOW (ref 22–32)
Calcium: 8.6 mg/dL — ABNORMAL LOW (ref 8.9–10.3)
Chloride: 112 mmol/L — ABNORMAL HIGH (ref 101–111)
Creatinine, Ser: 11.75 mg/dL — ABNORMAL HIGH (ref 0.61–1.24)
GFR calc Af Amer: 5 mL/min — ABNORMAL LOW (ref >60)
GFR calc non Af Amer: 4 mL/min — ABNORMAL LOW (ref >60)
Glucose, Bld: 78 mg/dL (ref 65–99)
Potassium: 6 mmol/L — ABNORMAL HIGH (ref 3.5–5.1)
Sodium: 136 mmol/L (ref 135–145)
Total Bilirubin: 0.4 mg/dL (ref 0.3–1.2)
Total Protein: 6.2 g/dL — ABNORMAL LOW (ref 6.5–8.1)

## 2016-03-30 LAB — BASIC METABOLIC PANEL
Anion gap: 13 (ref 5–15)
Anion gap: 11 (ref 5–15)
BUN: 149 mg/dL — ABNORMAL HIGH (ref 6–20)
BUN: 98 mg/dL — ABNORMAL HIGH (ref 6–20)
CO2: 9 mmol/L — ABNORMAL LOW (ref 22–32)
CO2: 17 mmol/L — ABNORMAL LOW (ref 22–32)
Calcium: 8.9 mg/dL (ref 8.9–10.3)
Calcium: 8.2 mg/dL — ABNORMAL LOW (ref 8.9–10.3)
Chloride: 113 mmol/L — ABNORMAL HIGH (ref 101–111)
Chloride: 110 mmol/L (ref 101–111)
Creatinine, Ser: 11.62 mg/dL — ABNORMAL HIGH (ref 0.61–1.24)
Creatinine, Ser: 8.46 mg/dL — ABNORMAL HIGH (ref 0.61–1.24)
GFR calc Af Amer: 5 mL/min — ABNORMAL LOW (ref >60)
GFR calc Af Amer: 7 mL/min — ABNORMAL LOW (ref >60)
GFR calc non Af Amer: 4 mL/min — ABNORMAL LOW (ref >60)
GFR calc non Af Amer: 6 mL/min — ABNORMAL LOW (ref >60)
Glucose, Bld: 122 mg/dL — ABNORMAL HIGH (ref 65–99)
Glucose, Bld: 102 mg/dL — ABNORMAL HIGH (ref 65–99)
Potassium: 6.2 mmol/L — ABNORMAL HIGH (ref 3.5–5.1)
Potassium: 4.7 mmol/L (ref 3.5–5.1)
Sodium: 135 mmol/L (ref 135–145)
Sodium: 138 mmol/L (ref 135–145)

## 2016-03-30 LAB — IRON AND TIBC
Iron: 56 ug/dL (ref 45–182)
Saturation Ratios: 19 % (ref 17.9–39.5)
TIBC: 300 ug/dL (ref 250–450)
UIBC: 244 ug/dL

## 2016-03-30 LAB — CBC
HCT: 28.4 % — ABNORMAL LOW (ref 39.0–52.0)
Hemoglobin: 9.2 g/dL — ABNORMAL LOW (ref 13.0–17.0)
MCH: 28.1 pg (ref 26.0–34.0)
MCHC: 32.4 g/dL (ref 30.0–36.0)
MCV: 86.9 fL (ref 78.0–100.0)
Platelets: 283 10*3/uL (ref 150–400)
RBC: 3.27 MIL/uL — ABNORMAL LOW (ref 4.22–5.81)
RDW: 15.9 % — ABNORMAL HIGH (ref 11.5–15.5)
WBC: 9.5 10*3/uL (ref 4.0–10.5)

## 2016-03-30 LAB — MRSA PCR SCREENING: MRSA by PCR: NEGATIVE

## 2016-03-30 LAB — PHOSPHORUS: Phosphorus: 5.6 mg/dL — ABNORMAL HIGH (ref 2.5–4.6)

## 2016-03-30 LAB — FERRITIN: Ferritin: 142 ng/mL (ref 24–336)

## 2016-03-30 LAB — RAPID STREP SCREEN (MED CTR MEBANE ONLY): Streptococcus, Group A Screen (Direct): NEGATIVE

## 2016-03-30 LAB — ALT: ALT: 20 U/L (ref 17–63)

## 2016-03-30 MED ORDER — ONDANSETRON HCL 4 MG/2ML IJ SOLN
4.0000 mg | Freq: Four times a day (QID) | INTRAMUSCULAR | Status: DC | PRN
  Administered 2016-03-30 – 2016-04-04 (×2): 4 mg via INTRAVENOUS
  Filled 2016-03-30 (×2): qty 2

## 2016-03-30 MED ORDER — SODIUM CHLORIDE 0.9 % IV SOLN
510.0000 mg | Freq: Once | INTRAVENOUS | Status: AC
  Administered 2016-03-30: 510 mg via INTRAVENOUS
  Filled 2016-03-30 (×2): qty 17

## 2016-03-30 MED ORDER — DARBEPOETIN ALFA 150 MCG/0.3ML IJ SOSY
150.0000 ug | PREFILLED_SYRINGE | INTRAMUSCULAR | Status: DC
  Administered 2016-04-04: 150 ug via INTRAVENOUS
  Filled 2016-03-30: qty 0.3

## 2016-03-30 MED ORDER — SODIUM CHLORIDE 0.9% FLUSH
10.0000 mL | INTRAVENOUS | Status: DC | PRN
  Administered 2016-03-30: 30 mL
  Administered 2016-04-02 – 2016-04-05 (×4): 10 mL
  Filled 2016-03-30 (×4): qty 40

## 2016-03-30 MED ORDER — HYDRALAZINE HCL 20 MG/ML IJ SOLN
10.0000 mg | Freq: Four times a day (QID) | INTRAMUSCULAR | Status: DC | PRN
  Administered 2016-03-30: 10 mg via INTRAVENOUS
  Filled 2016-03-30: qty 1

## 2016-03-30 MED ORDER — RENA-VITE PO TABS
1.0000 | ORAL_TABLET | Freq: Every day | ORAL | Status: DC
  Administered 2016-03-30 – 2016-04-09 (×11): 1 via ORAL
  Filled 2016-03-30 (×10): qty 1

## 2016-03-30 NOTE — Progress Notes (Signed)
MD Alcario Drought paged regarding patient's HTN, however orders received to hold any antihypertensives pending potential dialysis today. Will continue to monitor the patient closely.   Dylan [name]

## 2016-03-30 NOTE — Progress Notes (Signed)
PROGRESS NOTE    Dylan [name]  QMG:[id number] DOB: 13-Mar-1961 DOA: 03/29/2016 PCP: No primary care provider on file.   Brief Narrative:  Pt is a 57M with a PMH significant for HTN and CKD GV who is now seen in consultation at the request of Dr. Ron Parker of the ED for evaluation and management of progressive CKD and hyperkalemia.  Pt is originally from [place] and is here on vacation with his family, arrived on Nov 20th planning to leave on Dec 7th. [name] is a physician (pediatrician) and helps provide the history.  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  He had an acute illness in February (in [place]) with fever, vomiting, and diarrhea.  He went to the hospital and was told he needed dialysis.  He had two dialysis treatments and recovered enough renal function so that he didn't need permanent dialysis.  He was told to get a fistula placement but he wasn't able to find all of the supplies needed to schedule the surgery in his country. Two days ago he reported a cough and sore throat and so has been getting amoxicillin 500 mg daily. In ER found to have creatinine of K >7.5 and creatinine of 12.5, BUN 144  Assessment & Plan:     Progressive CKD 5 now ESRD -with hyperkalemia and symptoms of uremia -s/p urgent HD catheter placement and started HD today 11/30 -repeat labs -RD consult -Renal following -disposition unknown, visiting from [place]    Hyperkalemia -s/p kayexalate -HD today, will repeat labs    HTN (hypertension) - hydralazine PRN, continue nifedipine    History of anemia due to CKD -due to iron defi -aranesp with HD  DVT prophylaxis:Hep SQ Code Status:FUll COde Family Communication:No family at bedside, seen in HD Disposition Plan: To be determined   Consultants:   Renal   Procedures: HD catheter  Antimicrobials:  Subjective: Feels ok, throat feels better  Objective: Vitals:   03/30/16 1014 03/30/16 1030 03/30/16 1100 03/30/16 1130    BP: (!) 173/101 (!) 189/109 (!) 172/106 (!) 188/101  Pulse: 89 92 89 95  Resp: 16 18 14 16   Temp:      TempSrc:      SpO2: 100% 100% 99% 98%  Weight:        Intake/Output Summary (Last 24 hours) at 03/30/16 1138 Last data filed at 03/30/16 1057  Gross per 24 hour  Intake              340 ml  Output              600 ml  Net             -260 ml   Filed Weights   03/29/16 1623 03/30/16 0700 03/30/16 0903  Weight: 88.5 kg (195 lb) 85.7 kg (188 lb 15 oz) 85.7 kg (188 lb 15 oz)    Examination:  General exam: Appears calm and comfortable, AAOx3 HEENT: HD catheter in neck Respiratory system: Clear to auscultation. Respiratory effort normal. Cardiovascular system: S1 & S2 heard, RRR. No JVD, murmurs, rubs, gallops or clicks. No pedal edema. Gastrointestinal system: Abdomen is nondistended, soft and nontender. Normal bowel sounds heard. Central nervous system: Alert and oriented. No focal neurological deficits. Extremities: Symmetric 5 x 5 power. Skin: No rashes, lesions or ulcers Psychiatry: Judgement and insight appear normal. Mood & affect appropriate.     Data Reviewed: I have personally reviewed following labs and imaging studies  CBC:  Recent  Labs Lab 03/29/16 1625 03/29/16 1834 03/30/16 0548  WBC 8.7  --  9.5  NEUTROABS 7.2  --   --   HGB 9.5* 8.8* 9.2*  HCT 29.4* 26.0* 28.4*  MCV 88.0  --  86.9  PLT 247  --  914   Basic Metabolic Panel:  Recent Labs Lab 03/29/16 1625 03/29/16 1834 03/29/16 2300 03/30/16 0548  NA 135 136 135 136  K >7.5* 7.4* 6.2* 6.0*  CL 112* 118* 113* 112*  CO2 13*  --  9* 12*  GLUCOSE 103* 103* 122* 78  BUN 144* >140* 149* 147*  CREATININE 11.49* 12.50* 11.62* 11.75*  CALCIUM 8.3*  --  8.9 8.6*  PHOS  --   --  5.6*  --    GFR: CrCl cannot be calculated (Unknown ideal weight.). Liver Function Tests:  Recent Labs Lab 03/29/16 1625 03/30/16 0548 03/30/16 0722  AST 22 22  --   ALT 21 21 20   ALKPHOS 57 49  --   BILITOT  0.3 0.4  --   PROT 6.0* 6.2*  --   ALBUMIN 2.8* 2.7*  --    No results for input(s): LIPASE, AMYLASE in the last 168 hours. No results for input(s): AMMONIA in the last 168 hours. Coagulation Profile: No results for input(s): INR, PROTIME in the last 168 hours. Cardiac Enzymes: No results for input(s): CKTOTAL, CKMB, CKMBINDEX, TROPONINI in the last 168 hours. BNP (last 3 results) No results for input(s): PROBNP in the last 8760 hours. HbA1C: No results for input(s): HGBA1C in the last 72 hours. CBG: No results for input(s): GLUCAP in the last 168 hours. Lipid Profile: No results for input(s): CHOL, HDL, LDLCALC, TRIG, CHOLHDL, LDLDIRECT in the last 72 hours. Thyroid Function Tests: No results for input(s): TSH, T4TOTAL, FREET4, T3FREE, THYROIDAB in the last 72 hours. Anemia Panel:  Recent Labs  03/29/16 2300  FERRITIN 142  TIBC 300  IRON 56   Urine analysis: No results found for: COLORURINE, APPEARANCEUR, LABSPEC, PHURINE, GLUCOSEU, HGBUR, BILIRUBINUR, KETONESUR, PROTEINUR, UROBILINOGEN, NITRITE, LEUKOCYTESUR Sepsis Labs: @LABRCNTIP (procalcitonin:4,lacticidven:4)  ) Recent Results (from the past 240 hour(s))  Rapid strep screen (not at [facility])     Status: None   Collection Time: 03/30/16  4:13 AM  Result Value Ref Range Status   Streptococcus, Group A Screen (Direct) NEGATIVE NEGATIVE Final    Comment: (NOTE) A Rapid Antigen test may result negative if the antigen level in the sample is below the detection level of this test. The FDA has not cleared this test as a stand-alone test therefore the rapid antigen negative result has reflexed to a Group A Strep culture.   MRSA PCR Screening     Status: None   Collection Time: 03/30/16  4:30 AM  Result Value Ref Range Status   MRSA by PCR NEGATIVE NEGATIVE Final    Comment:        The GeneXpert MRSA Assay (FDA approved for NASAL specimens only), is one component of a comprehensive MRSA colonization surveillance program.  It is not intended to diagnose MRSA infection nor to guide or monitor treatment for MRSA infections.          Radiology Studies: Dg Chest 2 View  Result Date: 03/29/2016 CLINICAL DATA:  Cough, fatigue, loss of appetite and fever for 3 days, history hypertension, renal disorder EXAM: CHEST  2 VIEW COMPARISON:  None FINDINGS: Enlargement of cardiac silhouette. Mediastinal contours and pulmonary vascularity normal. Minimal peribronchial thickening. No pulmonary infiltrate, pleural effusion, or pneumothorax. Osseous  structures unremarkable. IMPRESSION: Enlargement of cardiac silhouette. Minimal bronchitic change without infiltrate. Electronically Signed   By: Lavonia Dana M.D.   On: 03/29/2016 16:58   Dg Chest Port 1 View  Result Date: 03/29/2016 CLINICAL DATA:  Central line placement.  Initial encounter. EXAM: PORTABLE CHEST 1 VIEW COMPARISON:  Chest radiograph performed earlier today at 4:36 p.m. FINDINGS: The patient's right IJ line is noted ending about the mid SVC. The lungs are well-aerated. Pulmonary vascularity is at the upper limits of normal. There is no evidence of focal opacification, pleural effusion or pneumothorax. The cardiomediastinal silhouette is borderline normal in size. No acute osseous abnormalities are seen. IMPRESSION: 1. Right IJ line noted ending about the mid SVC. 2. No acute cardiopulmonary process seen. Electronically Signed   By: Garald Balding M.D.   On: 03/29/2016 22:39        Scheduled Meds: . carvedilol  6.25 mg Oral TID WC  . [START ON 03/31/2016] darbepoetin (ARANESP) injection - DIALYSIS  150 mcg Intravenous Q Tue-HD  . heparin  5,000 Units Subcutaneous Q8H  . multivitamin  1 tablet Oral QHS  . NIFEdipine  60 mg Oral Daily   Continuous Infusions:   LOS: 1 day    Time spent: 29min    Domenic Polite, MD [facility] Pager [phone]  If 7PM-7AM, please contact night-coverage [email or web address] Password [email or web address] 03/30/2016, 11:38 AM

## 2016-03-30 NOTE — Progress Notes (Signed)
Report given to RN on 6N; pt to transfer to 6N24; will cont. To monitor.  Dylan [name]

## 2016-03-30 NOTE — Progress Notes (Signed)
Subjective: Interval History: does not speak English  Objective: Vital signs in last 24 hours: Temp:  [98.2 F (36.8 C)-98.6 F (37 C)] 98.6 F (37 C) (11/30 0700) Pulse Rate:  [78-102] 90 (11/30 0730) Resp:  [0-26] 15 (11/30 0730) BP: (120-184)/(75-111) 172/111 (11/30 0730) SpO2:  [97 %-100 %] 99 % (11/30 0700) Weight:  [85.7 kg (188 lb 15 oz)-88.5 kg (195 lb)] 85.7 kg (188 lb 15 oz) (11/30 0700) Weight change:   Intake/Output from previous day: 11/29 0701 - 11/30 0700 In: -  Out: 400 [Urine:400] Intake/Output this shift: No intake/output data recorded.  General appearance: alert, cooperative, no distress and pale Back: negative Resp: rales bibasilar Cardio: S1, S2 normal and systolic murmur: holosystolic 2/6, blowing at apex GI: pos bs, soft, liver down 5 cm Extremities: extremities normal, atraumatic, no cyanosis or edema  RIJ cath  Lab Results:  Recent Labs  03/29/16 1625 03/29/16 1834 03/30/16 0548  WBC 8.7  --  9.5  HGB 9.5* 8.8* 9.2*  HCT 29.4* 26.0* 28.4*  PLT 247  --  283   BMET:  Recent Labs  03/29/16 2300 03/30/16 0548  NA 135 136  K 6.2* 6.0*  CL 113* 112*  CO2 9* 12*  GLUCOSE 122* 78  BUN 149* 147*  CREATININE 11.62* 11.75*  CALCIUM 8.9 8.6*   No results for input(s): PTH in the last 72 hours. Iron Studies:  Recent Labs  03/29/16 2300  IRON 56  TIBC 300  FERRITIN 142    Studies/Results: Dg Chest 2 View  Result Date: 03/29/2016 CLINICAL DATA:  Cough, fatigue, loss of appetite and fever for 3 days, history hypertension, renal disorder EXAM: CHEST  2 VIEW COMPARISON:  None FINDINGS: Enlargement of cardiac silhouette. Mediastinal contours and pulmonary vascularity normal. Minimal peribronchial thickening. No pulmonary infiltrate, pleural effusion, or pneumothorax. Osseous structures unremarkable. IMPRESSION: Enlargement of cardiac silhouette. Minimal bronchitic change without infiltrate. Electronically Signed   By: Lavonia Dana M.D.   On:  03/29/2016 16:58   Dg Chest Port 1 View  Result Date: 03/29/2016 CLINICAL DATA:  Central line placement.  Initial encounter. EXAM: PORTABLE CHEST 1 VIEW COMPARISON:  Chest radiograph performed earlier today at 4:36 p.m. FINDINGS: The patient's right IJ line is noted ending about the mid SVC. The lungs are well-aerated. Pulmonary vascularity is at the upper limits of normal. There is no evidence of focal opacification, pleural effusion or pneumothorax. The cardiomediastinal silhouette is borderline normal in size. No acute osseous abnormalities are seen. IMPRESSION: 1. Right IJ line noted ending about the mid SVC. 2. No acute cardiopulmonary process seen. Electronically Signed   By: Garald Balding M.D.   On: 03/29/2016 22:39    I have reviewed the patient's current medications.  Assessment/Plan: 1 CKD 5 needs HD, starting.  Needs chronic.  willneed to w/o logistics 2 HTn lower vol meds 3 Anemia esa, check Fe 4 Hpth check 5 Social issues P HD, social work to see, counsel family, esa, vit D    LOS: 1 day   Gennie Eisinger L 03/30/2016,7:47 AM

## 2016-03-30 NOTE — Progress Notes (Signed)
MD paged at this time; BP still 180's/100's after PO BP meds given; will cont. To monitor.  Ruben Reason

## 2016-03-30 NOTE — Care Management Note (Signed)
Case Management Note  Patient Details  Name: Dylan [name] MRN: [id number] Date of Birth: [birth year]  Subjective/Objective:    CM received consult stating pt will need PCP.  Met with dtr @ bedside to provide list of low-cost clinics as pt has no insurance.  Dtr unsure @ present whether pt will remain in the [place] or travel back to [place].  If he does remain, she anticipates they will travel to [place], [place] where relatives live.                          Expected Discharge Plan:  Home/Self Care  In-House Referral:  Clinical Social Work  Discharge planning Services  CM Consult  Status of Service:  In process, will continue to follow  Girard Cooter, RN 03/30/2016, 11:55 AM

## 2016-03-30 NOTE — Progress Notes (Addendum)
Pt back from HD; pt wife and 2 daughters in room; 1 daughter speaks [place] and assisted with translation; when informed pt would receive Feraheme dose today, daughter stated that when pt was given dose earlier this year it caused N/V and chills; MD made aware; PRN Zofran ordered; will administer prior to dose; awaiting PO AM BP meds to arrive from pharmacy; pt lying in bed at this time without any complaints; pt eating breakfast; no N/V or pain at this time; will cont. To monitor.  Ruben Reason

## 2016-03-30 NOTE — Progress Notes (Signed)
Pt c/o nasal congestion; daughter at bedside requesting nasal spray; MD paged to make aware; will cont. To monitor.  Dylan [name]

## 2016-03-30 NOTE — Procedures (Signed)
I was present at this session.  I have reviewed the session itself and made appropriate changes.  1st HD, RIJ cath.  tol well.   Natalee Tomkiewicz L 11/30/20177:51 AM

## 2016-03-31 ENCOUNTER — Inpatient Hospital Stay (HOSPITAL_COMMUNITY): Payer: Medicaid Other

## 2016-03-31 ENCOUNTER — Encounter (HOSPITAL_COMMUNITY): Payer: Self-pay

## 2016-03-31 DIAGNOSIS — N186 End stage renal disease: Secondary | ICD-10-CM

## 2016-03-31 DIAGNOSIS — I10 Essential (primary) hypertension: Secondary | ICD-10-CM

## 2016-03-31 DIAGNOSIS — N185 Chronic kidney disease, stage 5: Secondary | ICD-10-CM

## 2016-03-31 LAB — HEPATITIS B CORE ANTIBODY, TOTAL
Hep B Core Total Ab: NEGATIVE
Hep B Core Total Ab: NEGATIVE

## 2016-03-31 LAB — BASIC METABOLIC PANEL
Anion gap: 14 (ref 5–15)
BUN: 108 mg/dL — ABNORMAL HIGH (ref 6–20)
CO2: 16 mmol/L — ABNORMAL LOW (ref 22–32)
Calcium: 8.5 mg/dL — ABNORMAL LOW (ref 8.9–10.3)
Chloride: 108 mmol/L (ref 101–111)
Creatinine, Ser: 9.5 mg/dL — ABNORMAL HIGH (ref 0.61–1.24)
GFR calc Af Amer: 6 mL/min — ABNORMAL LOW (ref >60)
GFR calc non Af Amer: 5 mL/min — ABNORMAL LOW (ref >60)
Glucose, Bld: 83 mg/dL (ref 65–99)
Potassium: 5.4 mmol/L — ABNORMAL HIGH (ref 3.5–5.1)
Sodium: 138 mmol/L (ref 135–145)

## 2016-03-31 LAB — HEPATITIS B SURFACE ANTIGEN
Hepatitis B Surface Ag: NEGATIVE
Hepatitis B Surface Ag: NEGATIVE

## 2016-03-31 LAB — PREALBUMIN: Prealbumin: 28.9 mg/dL (ref 18–38)

## 2016-03-31 LAB — CBC
HCT: 27.9 % — ABNORMAL LOW (ref 39.0–52.0)
Hemoglobin: 9.3 g/dL — ABNORMAL LOW (ref 13.0–17.0)
MCH: 28.8 pg (ref 26.0–34.0)
MCHC: 33.3 g/dL (ref 30.0–36.0)
MCV: 86.4 fL (ref 78.0–100.0)
Platelets: 287 10*3/uL (ref 150–400)
RBC: 3.23 MIL/uL — ABNORMAL LOW (ref 4.22–5.81)
RDW: 15.8 % — ABNORMAL HIGH (ref 11.5–15.5)
WBC: 7.6 10*3/uL (ref 4.0–10.5)

## 2016-03-31 LAB — HEPATITIS B SURFACE ANTIBODY,QUALITATIVE
Hep B S Ab: NONREACTIVE
Hep B S Ab: NONREACTIVE

## 2016-03-31 LAB — PARATHYROID HORMONE, INTACT (NO CA): PTH: 116 pg/mL — ABNORMAL HIGH (ref 15–65)

## 2016-03-31 LAB — PHOSPHORUS: Phosphorus: 5.8 mg/dL — ABNORMAL HIGH (ref 2.5–4.6)

## 2016-03-31 MED ORDER — SODIUM CHLORIDE 0.9 % IV SOLN: 100.0000 mL | INTRAVENOUS | Status: DC | PRN

## 2016-03-31 MED ORDER — ALTEPLASE 2 MG IJ SOLR: 2.0000 mg | Freq: Once | INTRAMUSCULAR | Status: DC | PRN

## 2016-03-31 MED ORDER — LIDOCAINE-PRILOCAINE 2.5-2.5 % EX CREA: 1.0000 "application " | TOPICAL_CREAM | CUTANEOUS | Status: DC | PRN

## 2016-03-31 MED ORDER — SODIUM CHLORIDE 0.9 % IV SOLN
100.0000 mL | INTRAVENOUS | Status: DC | PRN
Start: 2016-03-31 — End: 2016-04-01

## 2016-03-31 MED ORDER — HEPARIN SODIUM (PORCINE) 1000 UNIT/ML DIALYSIS: 40.0000 [IU]/kg | Freq: Once | INTRAMUSCULAR | Status: DC

## 2016-03-31 MED ORDER — LIDOCAINE HCL (PF) 1 % IJ SOLN: 5.0000 mL | INTRAMUSCULAR | Status: DC | PRN

## 2016-03-31 MED ORDER — CARVEDILOL 3.125 MG PO TABS
3.1250 mg | ORAL_TABLET | Freq: Two times a day (BID) | ORAL | Status: DC
  Administered 2016-03-31: 3.125 mg via ORAL
  Filled 2016-03-31: qty 1

## 2016-03-31 MED ORDER — PENTAFLUOROPROP-TETRAFLUOROETH EX AERO
1.0000 | INHALATION_SPRAY | CUTANEOUS | Status: DC | PRN
Start: 2016-03-31 — End: 2016-04-01

## 2016-03-31 MED ORDER — PENTAFLUOROPROP-TETRAFLUOROETH EX AERO: 1.0000 "application " | INHALATION_SPRAY | CUTANEOUS | Status: DC | PRN

## 2016-03-31 MED ORDER — HEPARIN SODIUM (PORCINE) 1000 UNIT/ML DIALYSIS: 1000.0000 [IU] | INTRAMUSCULAR | Status: DC | PRN

## 2016-03-31 NOTE — Progress Notes (Signed)
Patient had witnessed seizure episode with eyes focusing up with jerking moments for few seconds (30-40) sec, throwed up a little, then came back to normal.  BP checked 112/87, Pulse 82, PO2 97%.   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  Notified Dr. Broadus John about the situation, Ordered CT of head.  Will continue to monitor.

## 2016-03-31 NOTE — Consult Note (Signed)
[facility] NOTE   MRN : [id number]  Reason for Consult: ESRD Referring Physician: Dr. Jimmy Footman  History of Present Illness: 55 y/o male with CKD stage V.  We are being asked to provide permanent access and tunneled cathter for HD.   Past medical history  HTN managed with Carvedilol, seizures, and what sounds like CKD stage 5 at baseline, has had dialysis 2x previously in [place] but was not put on chronic dialysis.  He is here on vacation with family.  Arrived on Nov 20th and planning to leave Dec 7th although his [name] is for 6 months (can legally remain in [place] for that duration).     Current Facility-Administered Medications  Medication Dose Route Frequency Provider Last Rate Last Dose  . 0.9 %  sodium chloride infusion  100 mL Intravenous PRN Mauricia Area, MD      . 0.9 %  sodium chloride infusion  100 mL Intravenous PRN Mauricia Area, MD      . alteplase (CATHFLO ACTIVASE) injection 2 mg  2 mg Intracatheter Once PRN Mauricia Area, MD      . calcium carbonate (TUMS - dosed in mg elemental calcium) chewable tablet 200 mg of elemental calcium  1 tablet Oral Daily PRN Etta Quill, DO      . carvedilol (COREG) tablet 3.125 mg  3.125 mg Oral BID WC Domenic Polite, MD      . Darbepoetin Alfa (ARANESP) injection 150 mcg  150 mcg Intravenous Q Tue-HD Mauricia Area, MD      . heparin injection 1,000 Units  1,000 Units Dialysis PRN Mauricia Area, MD      . heparin injection 3,400 Units  40 Units/kg Dialysis Once in dialysis Mauricia Area, MD      . heparin injection 5,000 Units  5,000 Units Subcutaneous Q8H Etta Quill, DO   5,000 Units at 03/30/16 1329  . hydrALAZINE (APRESOLINE) injection 10 mg  10 mg Intravenous Q6H PRN Domenic Polite, MD   10 mg at 03/30/16 1148  . lidocaine (PF) (XYLOCAINE) 1 % injection 5 mL  5 mL Intradermal PRN Mauricia Area, MD      . lidocaine-prilocaine (EMLA) cream 1 application  1 application Topical PRN  Mauricia Area, MD      . multivitamin (RENA-VIT) tablet 1 tablet  1 tablet Oral QHS Mauricia Area, MD   1 tablet at 03/30/16 2221  . ondansetron (ZOFRAN) injection 4 mg  4 mg Intravenous Q6H PRN Domenic Polite, MD   4 mg at 03/30/16 1057  . pentafluoroprop-tetrafluoroeth (GEBAUERS) aerosol 1 application  1 application Topical PRN Mauricia Area, MD      . sodium chloride flush (NS) 0.9 % injection 10-40 mL  10-40 mL Intracatheter PRN Madelon Lips, MD   30 mL at 03/30/16 0003    Pt meds include: Statin :No Betablocker: Yes ASA: No Other anticoagulants/antiplatelets: none  Past Medical History:  Diagnosis Date  . Hypertension   . Renal disorder     History reviewed. No pertinent surgical history.  Social History Social History  Substance Use Topics  . Smoking status: Never Smoker  . Smokeless tobacco: Not on file  . Alcohol use No    Family History History reviewed. No pertinent family history.  Unknown  No Known Allergies   REVIEW OF SYSTEMS  General: [ ]  Weight loss, [ ]  Fever, [ ]  chills Neurologic: [ ]  Dizziness, [ ]  Blackouts, [ ]  Seizure [ ]  Stroke, [ ]  "Mini stroke", [ ]   Slurred speech, [ ]  Temporary blindness; [ ]  weakness in arms or legs, [ ]  Hoarseness [ ]  Dysphagia Cardiac: [ ]  Chest pain/pressure, [ ]  Shortness of breath at rest [x ] Shortness of breath with exertion, [ ]  Atrial fibrillation or irregular heartbeat  Vascular: [ ]  Pain in legs with walking, [ ]  Pain in legs at rest, [ ]  Pain in legs at night,  [ ]  Non-healing ulcer, [ ]  Blood clot in vein/DVT,   Pulmonary: [ ]  Home oxygen, [ ]  Productive cough, [ ]  Coughing up blood, [ ]  Asthma,  [ ]  Wheezing [ ]  COPD Musculoskeletal:  [ ]  Arthritis, [ ]  Low back pain, [ ]  Joint pain Hematologic: [ ]  Easy Bruising, [ ]  Anemia; [ ]  Hepatitis Gastrointestinal: [ ]  Blood in stool, [ ]  Gastroesophageal Reflux/heartburn, Urinary: [x ] chronic Kidney disease, [ ]  on HD - [ ]  MWF or [ ]  TTHS, [ ]  Burning with  urination, [ ]  Difficulty urinating Skin: [ ]  Rashes, [ ]  Wounds Psychological: [ ]  Anxiety, [ ]  Depression  Physical Examination Vitals:   03/31/16 0930 03/31/16 1000 03/31/16 1019 03/31/16 1103  BP: (!) 148/86 (!) 134/98 110/81 (!) (P) 143/83  Pulse: 92 93 92 (P) 87  Resp: 15 15 15    Temp:   98.8 F (37.1 C)   TempSrc:   Oral   SpO2:   98%   Weight:   183 lb 3.2 oz (83.1 kg)    There is no height or weight on file to calculate BMI.  General:  WDWN in NAD HENT: WNL Eyes: Pupils equal Pulmonary: normal non-labored breathing , without Rales, rhonchi,  wheezing Cardiac: RRR, without  Murmurs, rubs or gallops; No carotid bruits Abdomen: soft, NT, no masses Skin: no rashes, ulcers noted;  no Gangrene , no cellulitis; no open wounds;   Vascular Exam/Pulses:Palpable left radial pulse, non palpable right radial pulse, skin is warm to touch and sensation and active range of motion is intact.    Musculoskeletal: no muscle wasting or atrophy; all four ext. 2+ edema  Neurologic: A&O X 3; Appropriate Affect ;  SENSATION: normal; MOTOR FUNCTION: 5/5 Symmetric Speech is fluent/normal   Significant Diagnostic Studies: CBC Lab Results  Component Value Date   WBC 7.6 03/31/2016   HGB 9.3 (L) 03/31/2016   HCT 27.9 (L) 03/31/2016   MCV 86.4 03/31/2016   PLT 287 03/31/2016    BMET    Component Value Date/Time   NA 138 03/31/2016 0447   K 5.4 (H) 03/31/2016 0447   CL 108 03/31/2016 0447   CO2 16 (L) 03/31/2016 0447   GLUCOSE 83 03/31/2016 0447   BUN 108 (H) 03/31/2016 0447   CREATININE 9.50 (H) 03/31/2016 0447   CALCIUM 8.5 (L) 03/31/2016 0447   GFRNONAA 5 (L) 03/31/2016 0447   GFRAA 6 (L) 03/31/2016 0447   CrCl cannot be calculated (Unknown ideal weight.).  COAG No results found for: INR, PROTIME   Non-Invasive Vascular Imaging:  Pending vein mapping and arterial duplex bilateral UE  ASSESSMENT/PLAN:  ESRD currently on HD via temp catheter right IJ We will plan  Left AV fistula verses graft pending vein mapping and arterial duplex secondary to non palpable right radial pulse.  He is right handed.  He does not speak [place], his family is with him and interpreting.       Laurence Slate [facility] 03/31/2016 12:50 PM  I have independently interviewed patient and agree with PA assessment and plan above. Difficult situation  given here on vacation and possible inability to get hd in home country. Will plan for tdc and left arm bvt.   Dewey Neukam C. Donzetta Matters, MD [facility] [place] Office: [phone] Pager: [phone]

## 2016-03-31 NOTE — Progress Notes (Signed)
*  PRELIMINARY RESULTS* Vascular Ultrasound Bilateral Upper Extremity Arterial Duplex has been completed.   No evidence of hemodynamically significant stenosis.  Right Allen's test revealed unaffected signal with radial compression and signal obliteration with ulnar compression.  Left Allen's test demonstrates complete arch.     Right  Upper Extremity Vein Map    Cephalic  Segment Diameter Depth Comment  1. Axilla 1.55mm 3.65mm   2. Mid upper arm 2.48mm 2.8mm   3. Above [facility] 2.3mm 4.42mm   4. In [facility] 3.90mm 4.24mm   5. Below AC 1.43mm 3.73mm   6. Mid forearm 1.40mm 3.26mm   7. Wrist   Unable to visualize                  Basilic  Segment Diameter Depth Comment  1. Axilla 7.41mm 9.44mm   2. Mid upper arm 7.49mm 8.105mm   3. Above [facility] 5.19mm 4.45mm Branch  4. In [facility] 5.67mm 2.70mm Branch  5. Below AC 4.68mm 4.82mm   6. Mid forearm 4.34mm 4.83mm   7. Wrist 3.30mm 3.60mm Branch                    Left Upper Extremity Vein Map    Cephalic  Segment Diameter Depth Comment  1. Axilla 2.73mm 9.59mm   2. Mid upper arm 1.59mm 2.100mm   3. Above AC 0.84mm 1.4mm   4. In [facility]   Unable to visualize  5. Below AC   Unable to visualize  6. Mid forearm   Unable to visualize  7. Wrist   Unable to visualize                  Basilic  Segment Diameter Depth Comment  1. Axilla 5.16mm 7.29mm   2. Mid upper arm 5.80mm 7.97mm   3. Above [facility] 4.37mm 3.78mm Branch  4. In [facility] 2.80mm 1.8mm Branch  5. Below AC 3.48mm 3.66mm   6. Mid forearm 2.80mm 3.60mm Branch  7. Wrist 2.14mm 3.71mm                    03/31/2016 3:00 PM Maudry Mayhew, BS, RVT, RDCS, RDMS

## 2016-03-31 NOTE — Progress Notes (Signed)
PROGRESS NOTE    Dylan [name]  OVZ:[id number] DOB: 05/30/1960 DOA: 03/29/2016 PCP: No primary care provider on file.   Brief Narrative:  Pt is a 56M with a PMH significant for HTN and CKD GV who is now seen in consultation at the request of Dr. Ron Parker of the ED for evaluation and management of progressive CKD and hyperkalemia.  Pt is originally from [place] and is here on vacation with his family, arrived on Nov 20th planning to leave on Dec 7th. [name] is a physician (pediatrician) and helps provide the history.  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  He had an acute illness in February (in [place]) with fever, vomiting, and diarrhea.  He went to the hospital and was told he needed dialysis.  He had two dialysis treatments and recovered enough renal function so that he didn't need permanent dialysis.  He was told to get a fistula placement but he wasn't able to find all of the supplies needed to schedule the surgery in his country. Two days ago he reported a cough and sore throat and so has been getting amoxicillin 500 mg daily. In ER found to have creatinine of K >7.5 and creatinine of 12.5, BUN 144  Assessment & Plan:     Progressive CKD 5 now ESRD -with hyperkalemia and symptoms of uremia -s/p urgent HD catheter placement and started HD 11/30 -plan for HD today, CM and CSw consulted, very complicated logistics with needing longterm HD now -RD consult -Renal following -disposition unknown    Hyperkalemia -s/p kayexalate -corrected with HD    HTN (hypertension) - hydralazine PRN, continue nifedipine    History of anemia due to CKD -and iron defi -aranesp with HD  DVT prophylaxis:Hep SQ Code Status:FUll COde Family Communication:No family at bedside, seen in HD Disposition Plan: To be determined   Consultants:   Renal   Procedures: HD catheter  Antimicrobials:  Subjective: Feels ok, weak after HD yesterday, seen in HD  today  Objective: Vitals:   03/31/16 0930 03/31/16 1000 03/31/16 1019 03/31/16 1103  BP: (!) 148/86 (!) 134/98 110/81 (!) (P) 143/83  Pulse: 92 93 92 (P) 87  Resp: 15 15 15    Temp:   98.8 F (37.1 C)   TempSrc:   Oral   SpO2:   98%   Weight:   83.1 kg (183 lb 3.2 oz)     Intake/Output Summary (Last 24 hours) at 03/31/16 1403 Last data filed at 03/31/16 1019  Gross per 24 hour  Intake              240 ml  Output             3860 ml  Net            -3620 ml   Filed Weights   03/30/16 2000 03/31/16 0700 03/31/16 1019  Weight: 86.6 kg (191 lb) 85.1 kg (187 lb 9.8 oz) 83.1 kg (183 lb 3.2 oz)    Examination:  General exam: Appears calm and comfortable, AAOx3, no distress, seen in HD HEENT: HD catheter in neck Respiratory system: Clear to auscultation. Respiratory effort normal. Cardiovascular system: S1 & S2 heard, RRR. No JVD, murmurs, rubs, gallops or clicks. No pedal edema. Gastrointestinal system: Abdomen is nondistended, soft and nontender. Normal bowel sounds heard. Central nervous system: Alert and oriented. No focal neurological deficits. Extremities: Symmetric 5 x 5 power. Skin: No rashes, lesions or ulcers Psychiatry: flat affect  Data Reviewed: I have personally reviewed following labs and imaging studies  CBC:  Recent Labs Lab 03/29/16 1625 03/29/16 1834 03/30/16 0548 03/31/16 0447  WBC 8.7  --  9.5 7.6  NEUTROABS 7.2  --   --   --   HGB 9.5* 8.8* 9.2* 9.3*  HCT 29.4* 26.0* 28.4* 27.9*  MCV 88.0  --  86.9 86.4  PLT 247  --  283 342   Basic Metabolic Panel:  Recent Labs Lab 03/29/16 1625 03/29/16 1834 03/29/16 2300 03/30/16 0548 03/30/16 1316 03/31/16 0447 03/31/16 0730  NA 135 136 135 136 138 138  --   K >7.5* 7.4* 6.2* 6.0* 4.7 5.4*  --   CL 112* 118* 113* 112* 110 108  --   CO2 13*  --  9* 12* 17* 16*  --   GLUCOSE 103* 103* 122* 78 102* 83  --   BUN 144* >140* 149* 147* 98* 108*  --   CREATININE 11.49* 12.50* 11.62* 11.75* 8.46*  9.50*  --   CALCIUM 8.3*  --  8.9 8.6* 8.2* 8.5*  --   PHOS  --   --  5.6*  --   --   --  5.8*   GFR: CrCl cannot be calculated (Unknown ideal weight.). Liver Function Tests:  Recent Labs Lab 03/29/16 1625 03/30/16 0548 03/30/16 0722  AST 22 22  --   ALT 21 21 20   ALKPHOS 57 49  --   BILITOT 0.3 0.4  --   PROT 6.0* 6.2*  --   ALBUMIN 2.8* 2.7*  --    No results for input(s): LIPASE, AMYLASE in the last 168 hours. No results for input(s): AMMONIA in the last 168 hours. Coagulation Profile: No results for input(s): INR, PROTIME in the last 168 hours. Cardiac Enzymes: No results for input(s): CKTOTAL, CKMB, CKMBINDEX, TROPONINI in the last 168 hours. BNP (last 3 results) No results for input(s): PROBNP in the last 8760 hours. HbA1C: No results for input(s): HGBA1C in the last 72 hours. CBG: No results for input(s): GLUCAP in the last 168 hours. Lipid Profile: No results for input(s): CHOL, HDL, LDLCALC, TRIG, CHOLHDL, LDLDIRECT in the last 72 hours. Thyroid Function Tests: No results for input(s): TSH, T4TOTAL, FREET4, T3FREE, THYROIDAB in the last 72 hours. Anemia Panel:  Recent Labs  03/29/16 2300  FERRITIN 142  TIBC 300  IRON 56   Urine analysis: No results found for: COLORURINE, APPEARANCEUR, LABSPEC, PHURINE, GLUCOSEU, HGBUR, BILIRUBINUR, KETONESUR, PROTEINUR, UROBILINOGEN, NITRITE, LEUKOCYTESUR Sepsis Labs: @LABRCNTIP (procalcitonin:4,lacticidven:4)  ) Recent Results (from the past 240 hour(s))  Rapid strep screen (not at [facility])     Status: None   Collection Time: 03/30/16  4:13 AM  Result Value Ref Range Status   Streptococcus, Group A Screen (Direct) NEGATIVE NEGATIVE Final    Comment: (NOTE) A Rapid Antigen test may result negative if the antigen level in the sample is below the detection level of this test. The FDA has not cleared this test as a stand-alone test therefore the rapid antigen negative result has reflexed to a Group A Strep culture.    Culture, group A strep     Status: None (Preliminary result)   Collection Time: 03/30/16  4:13 AM  Result Value Ref Range Status   Specimen Description THROAT  Final   Special Requests NONE Reflexed from [id number]  Final   Culture CULTURE REINCUBATED FOR BETTER GROWTH  Final   Report Status PENDING  Incomplete  MRSA PCR Screening  Status: None   Collection Time: 03/30/16  4:30 AM  Result Value Ref Range Status   MRSA by PCR NEGATIVE NEGATIVE Final    Comment:        The GeneXpert MRSA Assay (FDA approved for NASAL specimens only), is one component of a comprehensive MRSA colonization surveillance program. It is not intended to diagnose MRSA infection nor to guide or monitor treatment for MRSA infections.          Radiology Studies: Dg Chest 2 View  Result Date: 03/29/2016 CLINICAL DATA:  Cough, fatigue, loss of appetite and fever for 3 days, history hypertension, renal disorder EXAM: CHEST  2 VIEW COMPARISON:  None FINDINGS: Enlargement of cardiac silhouette. Mediastinal contours and pulmonary vascularity normal. Minimal peribronchial thickening. No pulmonary infiltrate, pleural effusion, or pneumothorax. Osseous structures unremarkable. IMPRESSION: Enlargement of cardiac silhouette. Minimal bronchitic change without infiltrate. Electronically Signed   By: Lavonia Dana M.D.   On: 03/29/2016 16:58   Dg Chest Port 1 View  Result Date: 03/29/2016 CLINICAL DATA:  Central line placement.  Initial encounter. EXAM: PORTABLE CHEST 1 VIEW COMPARISON:  Chest radiograph performed earlier today at 4:36 p.m. FINDINGS: The patient's right IJ line is noted ending about the mid SVC. The lungs are well-aerated. Pulmonary vascularity is at the upper limits of normal. There is no evidence of focal opacification, pleural effusion or pneumothorax. The cardiomediastinal silhouette is borderline normal in size. No acute osseous abnormalities are seen. IMPRESSION: 1. Right IJ line noted ending about  the mid SVC. 2. No acute cardiopulmonary process seen. Electronically Signed   By: Garald Balding M.D.   On: 03/29/2016 22:39        Scheduled Meds: . carvedilol  3.125 mg Oral BID WC  . darbepoetin (ARANESP) injection - DIALYSIS  150 mcg Intravenous Q Tue-HD  . heparin  40 Units/kg Dialysis Once in dialysis  . heparin  5,000 Units Subcutaneous Q8H  . multivitamin  1 tablet Oral QHS   Continuous Infusions:   LOS: 2 days    Time spent: 76min    Domenic Polite, MD [facility] Pager [phone]  If 7PM-7AM, please contact night-coverage [email or web address] Password [email or web address] 03/31/2016, 2:03 PM

## 2016-03-31 NOTE — Plan of Care (Signed)
Problem: Food- and Nutrition-Related Knowledge Deficit (NB-1.1) Goal: Nutrition education Formal process to instruct or train a patient/client in a skill or to impart knowledge to help patients/clients voluntarily manage or modify food choices and eating behavior to maintain or improve health. Outcome: Completed/Met Date Met: 03/31/16 Nutrition Education Note  RD consulted for Renal Education. Provided "Eating Healthy with Kidney Disease" handout in Spanish and [place] language to patient/family. Family at bedside. Daughter present and speaks english. Reviewed food groups and provided written recommended serving sizes specifically determined for patient's current nutritional status.   Explained why diet restrictions are needed and provided lists of foods to limit/avoid that are high potassium, sodium, and phosphorus. Provided specific recommendations on safer alternatives of these foods. Strongly encouraged compliance of this diet.   Discussed importance of protein intake at each meal and snack. Provided examples of how to maximize protein intake throughout the day. Discussed need for fluid restriction with dialysis and renal-friendly beverage options. Teach back method used.  Expect good compliance.  Current diet order is renal with 1200 ml fluids, patient is consuming approximately 75-100% of meals at this time. Labs and medications reviewed. No further nutrition interventions warranted at this time. RD contact information provided. If additional nutrition issues arise, please re-consult RD.  Corrin Parker, MS, RD, LDN Pager # [phone] After hours/ weekend pager # [phone]

## 2016-03-31 NOTE — Procedures (Signed)
I was present at this session.  I have reviewed the session itself and made appropriate changes.  HD via temp cath,  bp tol HD,  No prob at this time  [name] 12/1/201710:03 AM

## 2016-03-31 NOTE — Progress Notes (Signed)
Subjective: Interval History: has no complaint .  Objective: Vital signs in last 24 hours: Temp:  [97.5 F (36.4 C)-99.2 F (37.3 C)] 99.2 F (37.3 C) (12/01 0700) Pulse Rate:  [81-100] 92 (12/01 0900) Resp:  [13-21] 16 (12/01 0900) BP: (147-189)/(86-109) 147/93 (12/01 0900) SpO2:  [96 %-100 %] 98 % (12/01 0700) Weight:  [85.1 kg (187 lb 9.8 oz)-86.6 kg (191 lb)] 85.1 kg (187 lb 9.8 oz) (12/01 0700) Weight change: -2.751 kg (-6 lb 1.1 oz)  Intake/Output from previous day: 11/30 0701 - 12/01 0700 In: 700 [P.O.:600; IV Piggyback:100] Out: 2060 [Urine:2060] Intake/Output this shift: No intake/output data recorded.  General appearance: alert, cooperative and no distress Neck: RIJ cath Resp: clear to auscultation bilaterally Cardio: S1, S2 normal and systolic murmur: holosystolic 2/6, blowing at apex GI: soft, non-tender; bowel sounds normal; no masses,  no organomegaly Extremities: extremities normal, atraumatic, no cyanosis or edema  Lab Results:  Recent Labs  03/30/16 0548 03/31/16 0447  WBC 9.5 7.6  HGB 9.2* 9.3*  HCT 28.4* 27.9*  PLT 283 287   BMET:  Recent Labs  03/30/16 1316 03/31/16 0447  NA 138 138  K 4.7 5.4*  CL 110 108  CO2 17* 16*  GLUCOSE 102* 83  BUN 98* 108*  CREATININE 8.46* 9.50*  CALCIUM 8.2* 8.5*    Recent Labs  03/29/16 2300  PTH 116*   Iron Studies:  Recent Labs  03/29/16 2300  IRON 56  TIBC 300  FERRITIN 142    Studies/Results: Dg Chest 2 View  Result Date: 03/29/2016 CLINICAL DATA:  Cough, fatigue, loss of appetite and fever for 3 days, history hypertension, renal disorder EXAM: CHEST  2 VIEW COMPARISON:  None FINDINGS: Enlargement of cardiac silhouette. Mediastinal contours and pulmonary vascularity normal. Minimal peribronchial thickening. No pulmonary infiltrate, pleural effusion, or pneumothorax. Osseous structures unremarkable. IMPRESSION: Enlargement of cardiac silhouette. Minimal bronchitic change without infiltrate.  Electronically Signed   By: Lavonia Dana M.D.   On: 03/29/2016 16:58   Dg Chest Port 1 View  Result Date: 03/29/2016 CLINICAL DATA:  Central line placement.  Initial encounter. EXAM: PORTABLE CHEST 1 VIEW COMPARISON:  Chest radiograph performed earlier today at 4:36 p.m. FINDINGS: The patient's right IJ line is noted ending about the mid SVC. The lungs are well-aerated. Pulmonary vascularity is at the upper limits of normal. There is no evidence of focal opacification, pleural effusion or pneumothorax. The cardiomediastinal silhouette is borderline normal in size. No acute osseous abnormalities are seen. IMPRESSION: 1. Right IJ line noted ending about the mid SVC. 2. No acute cardiopulmonary process seen. Electronically Signed   By: Garald Balding M.D.   On: 03/29/2016 22:39    I have reviewed the patient's current medications.  Assessment/Plan: 1 CKD 5 now ESRD needs access. Less uremic , less vol xs. Will so 3rd HD.   2 HTN lower with HD, lower vol 3 Anemia on ESA 4 HPTH  Hold off vit D 5 Language barrier will discuss with family what he needs and what their plans are P HD, esa, access, family discussions    LOS: 2 days   Ruby Logiudice L 03/31/2016,10:03 AM

## 2016-04-01 LAB — CBC
HCT: 31.6 % — ABNORMAL LOW (ref 39.0–52.0)
Hemoglobin: 10.4 g/dL — ABNORMAL LOW (ref 13.0–17.0)
MCH: 28.5 pg (ref 26.0–34.0)
MCHC: 32.9 g/dL (ref 30.0–36.0)
MCV: 86.6 fL (ref 78.0–100.0)
Platelets: 349 10*3/uL (ref 150–400)
RBC: 3.65 MIL/uL — ABNORMAL LOW (ref 4.22–5.81)
RDW: 15.4 % (ref 11.5–15.5)
WBC: 9.5 10*3/uL (ref 4.0–10.5)

## 2016-04-01 LAB — CULTURE, GROUP A STREP (THRC)

## 2016-04-01 LAB — BASIC METABOLIC PANEL
Anion gap: 15 (ref 5–15)
BUN: 79 mg/dL — ABNORMAL HIGH (ref 6–20)
CO2: 21 mmol/L — ABNORMAL LOW (ref 22–32)
Calcium: 8.7 mg/dL — ABNORMAL LOW (ref 8.9–10.3)
Chloride: 99 mmol/L — ABNORMAL LOW (ref 101–111)
Creatinine, Ser: 8.06 mg/dL — ABNORMAL HIGH (ref 0.61–1.24)
GFR calc Af Amer: 8 mL/min — ABNORMAL LOW (ref >60)
GFR calc non Af Amer: 7 mL/min — ABNORMAL LOW (ref >60)
Glucose, Bld: 89 mg/dL (ref 65–99)
Potassium: 4.5 mmol/L (ref 3.5–5.1)
Sodium: 135 mmol/L (ref 135–145)

## 2016-04-01 MED ORDER — SODIUM CHLORIDE 0.9 % IV BOLUS (SEPSIS)
500.0000 mL | Freq: Once | INTRAVENOUS | Status: AC
  Administered 2016-04-01: 500 mL via INTRAVENOUS

## 2016-04-01 MED ORDER — SODIUM CHLORIDE 0.9 % IV SOLN
125.0000 mg | INTRAVENOUS | Status: DC
  Administered 2016-04-01 – 2016-04-04 (×2): 125 mg via INTRAVENOUS
  Filled 2016-04-01 (×7): qty 10

## 2016-04-01 NOTE — Progress Notes (Signed)
   Attempted to see but was again have seizure like activity Will plan for tdc and definitive access early this week possible Tuesday if stable at that time.  Unfortunately will need left arm basilic vein transposition in 2 stages or could be considered primarily for graft given his situation, will discuss with nephrology.   Alexandar Weisenberger C. Donzetta Matters, MD [facility] [place] Office: [phone] Pager: [phone]

## 2016-04-01 NOTE — Procedures (Signed)
I was present at this session.  I have reviewed the session itself and made appropriate changes.  HD via temp cath. bp ok.  Low flows yet  [name] L 12/2/20177:01 AM

## 2016-04-01 NOTE — Progress Notes (Addendum)
PROGRESS NOTE    Dylan [name]  YBO:[id number] DOB: 06-13-1960 DOA: 03/29/2016 PCP: No primary care provider on file.   Brief Narrative:  Pt is a 55M with a PMH significant for HTN and CKD GV who is now seen in consultation at the request of Dr. Ron Parker of the ED for evaluation and management of progressive CKD and hyperkalemia.  Pt is originally from [place] and is here on vacation with his family, arrived on Nov 20th planning to leave on Dec 7th. [name] is a physician (pediatrician) and helps provide the history.  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  He had an acute illness in February (in [place]) with fever, vomiting, and diarrhea.  He went to the hospital and was told he needed dialysis.  He had two dialysis treatments and recovered enough renal function so that he didn't need permanent dialysis.  He was told to get a fistula placement but he wasn't able to find all of the supplies needed to schedule the surgery in [place] country. Two days ago he reported a cough and sore throat and so has been getting amoxicillin 500 mg daily. In ER found to have creatinine of K >7.5 and creatinine of 12.5, BUN 144  Assessment & Plan:     Progressive CKD 5 now ESRD -with hyperkalemia and symptoms of uremia-improving -s/p urgent HD catheter placement and started HD 11/30 -undergoing daily HD -Dizzy and BP 60 after HD today near syncopal episode, gave 500cc NS bolus -CM and CSW consulted, very complicated logistics with needing longterm HD now -RD consulted -Renal following -disposition unknown    Hyperkalemia -s/p kayexalate -corrected with HD    HTN (hypertension) - BP running low -nifedipine and coreg stopped    History of anemia due to CKD -and iron defi -aranesp with HD  DVT prophylaxis:Hep SQ Code Status:FUll COde Family Communication:Family at bedside Disposition Plan: To be determined   Consultants:   Renal   Procedures: HD  catheter  Antimicrobials:  Subjective: Dizzy after HD today  Objective: Vitals:   04/01/16 0944 04/01/16 1016 04/01/16 1045 04/01/16 1134  BP: 135/86 115/80 (!) 62/43 124/80  Pulse: 87 93  71  Resp: 17 16    Temp: 98.3 F (36.8 C) 98.2 F (36.8 C)    TempSrc: Oral Oral    SpO2: 98% 99%  98%  Weight: 81.6 kg (179 lb 14.3 oz)       Intake/Output Summary (Last 24 hours) at 04/01/16 1438 Last data filed at 04/01/16 1424  Gross per 24 hour  Intake              780 ml  Output             3231 ml  Net            -2451 ml   Filed Weights   03/31/16 2100 04/01/16 0639 04/01/16 0944  Weight: 82.1 kg (181 lb) 84.2 kg (185 lb 10 oz) 81.6 kg (179 lb 14.3 oz)    Examination:  General exam: Appears calm and comfortable, AAOx3, no distress, seen in HD HEENT: HD catheter in neck Respiratory system: Clear to auscultation. Respiratory effort normal. Cardiovascular system: S1 & S2 heard, RRR. No JVD, murmurs, rubs, gallops or clicks. No pedal edema. Gastrointestinal system: Abdomen is nondistended, soft and nontender. Normal bowel sounds heard. Central nervous system: Alert and oriented. No focal neurological deficits. Extremities: Symmetric 5 x 5 power. Skin: No rashes, lesions or ulcers Psychiatry:  flat affect    Data Reviewed: I have personally reviewed following labs and imaging studies  CBC:  Recent Labs Lab 03/29/16 1625 03/29/16 1834 03/30/16 0548 03/31/16 0447 04/01/16 0443  WBC 8.7  --  9.5 7.6 9.5  NEUTROABS 7.2  --   --   --   --   HGB 9.5* 8.8* 9.2* 9.3* 10.4*  HCT 29.4* 26.0* 28.4* 27.9* 31.6*  MCV 88.0  --  86.9 86.4 86.6  PLT 247  --  283 287 983   Basic Metabolic Panel:  Recent Labs Lab 03/29/16 2300 03/30/16 0548 03/30/16 1316 03/31/16 0447 03/31/16 0730 04/01/16 0443  NA 135 136 138 138  --  135  K 6.2* 6.0* 4.7 5.4*  --  4.5  CL 113* 112* 110 108  --  99*  CO2 9* 12* 17* 16*  --  21*  GLUCOSE 122* 78 102* 83  --  89  BUN 149* 147* 98*  108*  --  79*  CREATININE 11.62* 11.75* 8.46* 9.50*  --  8.06*  CALCIUM 8.9 8.6* 8.2* 8.5*  --  8.7*  PHOS 5.6*  --   --   --  5.8*  --    GFR: CrCl cannot be calculated (Unknown ideal weight.). Liver Function Tests:  Recent Labs Lab 03/29/16 1625 03/30/16 0548 03/30/16 0722  AST 22 22  --   ALT 21 21 20   ALKPHOS 57 49  --   BILITOT 0.3 0.4  --   PROT 6.0* 6.2*  --   ALBUMIN 2.8* 2.7*  --    No results for input(s): LIPASE, AMYLASE in the last 168 hours. No results for input(s): AMMONIA in the last 168 hours. Coagulation Profile: No results for input(s): INR, PROTIME in the last 168 hours. Cardiac Enzymes: No results for input(s): CKTOTAL, CKMB, CKMBINDEX, TROPONINI in the last 168 hours. BNP (last 3 results) No results for input(s): PROBNP in the last 8760 hours. HbA1C: No results for input(s): HGBA1C in the last 72 hours. CBG: No results for input(s): GLUCAP in the last 168 hours. Lipid Profile: No results for input(s): CHOL, HDL, LDLCALC, TRIG, CHOLHDL, LDLDIRECT in the last 72 hours. Thyroid Function Tests: No results for input(s): TSH, T4TOTAL, FREET4, T3FREE, THYROIDAB in the last 72 hours. Anemia Panel:  Recent Labs  03/29/16 2300  FERRITIN 142  TIBC 300  IRON 56   Urine analysis: No results found for: COLORURINE, APPEARANCEUR, LABSPEC, PHURINE, GLUCOSEU, HGBUR, BILIRUBINUR, KETONESUR, PROTEINUR, UROBILINOGEN, NITRITE, LEUKOCYTESUR Sepsis Labs: @LABRCNTIP (procalcitonin:4,lacticidven:4)  ) Recent Results (from the past 240 hour(s))  Rapid strep screen (not at [facility])     Status: None   Collection Time: 03/30/16  4:13 AM  Result Value Ref Range Status   Streptococcus, Group A Screen (Direct) NEGATIVE NEGATIVE Final    Comment: (NOTE) A Rapid Antigen test may result negative if the antigen level in the sample is below the detection level of this test. The FDA has not cleared this test as a stand-alone test therefore the rapid antigen negative result has  reflexed to a Group A Strep culture.   Culture, group A strep     Status: None   Collection Time: 03/30/16  4:13 AM  Result Value Ref Range Status   Specimen Description THROAT  Final   Special Requests NONE Reflexed from [id number]  Final   Culture NO GROUP A STREP (S.PYOGENES) ISOLATED  Final   Report Status 04/01/2016 FINAL  Final  MRSA PCR Screening     Status:  None   Collection Time: 03/30/16  4:30 AM  Result Value Ref Range Status   MRSA by PCR NEGATIVE NEGATIVE Final    Comment:        The GeneXpert MRSA Assay (FDA approved for NASAL specimens only), is one component of a comprehensive MRSA colonization surveillance program. It is not intended to diagnose MRSA infection nor to guide or monitor treatment for MRSA infections.          Radiology Studies: Ct Head Wo Contrast  Result Date: 03/31/2016 CLINICAL DATA:  55 year old male with progressive chronic renal disease and hyperkalemia. Seizures. Initial encounter. EXAM: CT HEAD WITHOUT CONTRAST TECHNIQUE: Contiguous axial images were obtained from the base of the skull through the vertex without intravenous contrast. COMPARISON:  None. FINDINGS: Brain: No midline shift, ventriculomegaly, mass effect, evidence of mass lesion, intracranial hemorrhage or evidence of cortically based acute infarction. Gray-white matter differentiation is within normal limits throughout the brain. No cortical encephalomalacia identified. Vascular: No suspicious intracranial vascular hyperdensity. Skull: No acute osseous abnormality identified. Sinuses/Orbits: Bubbly opacity in both maxillary sinuses and the right sphenoid sinus. Fluid level in the right sphenoid. Mild to moderate paranasal sinus mucosal thickening elsewhere. Chronic appearing opacification and sclerosis of the right mastoid air cells. Left mastoids and both tympanic cavities are clear. Other: No acute orbit or scalp soft tissue findings. IMPRESSION: 1. Negative noncontrast CT appearance  of the brain. 2. Paranasal sinus disease with some fluid levels and bubbly opacity suggesting acute sinusitis. Electronically Signed   By: [name] M.D.   On: 03/31/2016 16:52        Scheduled Meds: . carvedilol  3.125 mg Oral BID WC  . darbepoetin (ARANESP) injection - DIALYSIS  150 mcg Intravenous Q Tue-HD  . ferric gluconate (FERRLECIT/NULECIT) IV  125 mg Intravenous Q T,Th,Sa-HD  . heparin  5,000 Units Subcutaneous Q8H  . multivitamin  1 tablet Oral QHS  . sodium chloride  500 mL Intravenous Once   Continuous Infusions:   LOS: 3 days    Time spent: 63min    Domenic Polite, MD [facility] Pager [phone]  If 7PM-7AM, please contact night-coverage [email or web address] Password [facility] 04/01/2016, 2:38 PM

## 2016-04-01 NOTE — Progress Notes (Signed)
Subjective: Interval History: none.  Objective: Vital signs in last 24 hours: Temp:  [98 F (36.7 C)-99.2 F (37.3 C)] 98 F (36.7 C) (12/02 0505) Pulse Rate:  [75-93] 75 (12/02 0505) Resp:  [15-19] 19 (12/02 0505) BP: (110-167)/(81-98) 129/83 (12/02 0505) SpO2:  [98 %-100 %] 100 % (12/02 0505) Weight:  [82.1 kg (181 lb)-85.1 kg (187 lb 9.8 oz)] 82.1 kg (181 lb) (12/01 2100) Weight change: -2.6 kg (-5 lb 11.7 oz)  Intake/Output from previous day: 12/01 0701 - 12/02 0700 In: 540 [P.O.:540] Out: 2825 [Urine:825] Intake/Output this shift: Total I/O In: 60 [P.O.:60] Out: 825 [Urine:825]  General appearance: alert, cooperative and no distress Neck: RIJ cath Resp: clear to auscultation bilaterally Cardio: S1, S2 normal and systolic murmur: systolic ejection 2/6, decrescendo at 2nd left intercostal space GI: soft, non-tender; bowel sounds normal; no masses,  no organomegaly Extremities: extremities normal, atraumatic, no cyanosis or edema  Lab Results:  Recent Labs  03/31/16 0447 04/01/16 0443  WBC 7.6 9.5  HGB 9.3* 10.4*  HCT 27.9* 31.6*  PLT 287 349   BMET:  Recent Labs  03/30/16 1316 03/31/16 0447  NA 138 138  K 4.7 5.4*  CL 110 108  CO2 17* 16*  GLUCOSE 102* 83  BUN 98* 108*  CREATININE 8.46* 9.50*  CALCIUM 8.2* 8.5*    Recent Labs  03/29/16 2300  PTH 116*   Iron Studies:  Recent Labs  03/29/16 2300  IRON 56  TIBC 300  FERRITIN 142    Studies/Results: Ct Head Wo Contrast  Result Date: 03/31/2016 CLINICAL DATA:  55 year old male with progressive chronic renal disease and hyperkalemia. Seizures. Initial encounter. EXAM: CT HEAD WITHOUT CONTRAST TECHNIQUE: Contiguous axial images were obtained from the base of the skull through the vertex without intravenous contrast. COMPARISON:  None. FINDINGS: Brain: No midline shift, ventriculomegaly, mass effect, evidence of mass lesion, intracranial hemorrhage or evidence of cortically based acute infarction.  Gray-white matter differentiation is within normal limits throughout the brain. No cortical encephalomalacia identified. Vascular: No suspicious intracranial vascular hyperdensity. Skull: No acute osseous abnormality identified. Sinuses/Orbits: Bubbly opacity in both maxillary sinuses and the right sphenoid sinus. Fluid level in the right sphenoid. Mild to moderate paranasal sinus mucosal thickening elsewhere. Chronic appearing opacification and sclerosis of the right mastoid air cells. Left mastoids and both tympanic cavities are clear. Other: No acute orbit or scalp soft tissue findings. IMPRESSION: 1. Negative noncontrast CT appearance of the brain. 2. Paranasal sinus disease with some fluid levels and bubbly opacity suggesting acute sinusitis. Electronically Signed   By: Dylan [name] M.D.   On: 03/31/2016 16:52    I have reviewed the patient's current medications.  Assessment/Plan: 1 CKD 5 now ESRD.  3rd HD.  Discussed with daughter plans, to eval resources in [place], or being forced to stay here.  will get access 2 Anemia Fe/ESA 3 HPTH low dose vit D 4 HTN improving with better vol P HD, access, social issues  LOS: 3 days   Dylan [name] L 04/01/2016,6:50 AM

## 2016-04-02 NOTE — Progress Notes (Signed)
Subjective: Interval History: has no complaint , low bp post HD yest.  Objective: Vital signs in last 24 hours: Temp:  [97.6 F (36.4 C)-99.3 F (37.4 C)] 98.6 F (37 C) (12/03 0939) Pulse Rate:  [67-81] 81 (12/03 0939) Resp:  [16-18] 18 (12/03 0939) BP: (143-166)/(82-92) 143/82 (12/03 0939) SpO2:  [99 %-100 %] 100 % (12/03 0939) Weight:  [82.4 kg (181 lb 9.6 oz)] 82.4 kg (181 lb 9.6 oz) (12/02 2242) Weight change: -1.5 kg (-3 lb 4.9 oz)  Intake/Output from previous day: 12/02 0701 - 12/03 0700 In: 840 [P.O.:840] Out: 2716 [Urine:310] Intake/Output this shift: Total I/O In: 240 [P.O.:240] Out: 0   General appearance: alert, cooperative and no distress Neck: below Resp: RIJ cath Cardio: S1, S2 normal and systolic murmur: holosystolic 2/6, blowing at apex GI: pos bs, soft, nontender Extremities: extremities normal, atraumatic, no cyanosis or edema  Resp no R,R,W  Lab Results:  Recent Labs  03/31/16 0447 04/01/16 0443  WBC 7.6 9.5  HGB 9.3* 10.4*  HCT 27.9* 31.6*  PLT 287 349   BMET:  Recent Labs  03/31/16 0447 04/01/16 0443  NA 138 135  K 5.4* 4.5  CL 108 99*  CO2 16* 21*  GLUCOSE 83 89  BUN 108* 79*  CREATININE 9.50* 8.06*  CALCIUM 8.5* 8.7*   No results for input(s): PTH in the last 72 hours. Iron Studies: No results for input(s): IRON, TIBC, TRANSFERRIN, FERRITIN in the last 72 hours.  Studies/Results: Ct Head Wo Contrast  Result Date: 03/31/2016 CLINICAL DATA:  55 year old male with progressive chronic renal disease and hyperkalemia. Seizures. Initial encounter. EXAM: CT HEAD WITHOUT CONTRAST TECHNIQUE: Contiguous axial images were obtained from the base of the skull through the vertex without intravenous contrast. COMPARISON:  None. FINDINGS: Brain: No midline shift, ventriculomegaly, mass effect, evidence of mass lesion, intracranial hemorrhage or evidence of cortically based acute infarction. Gray-white matter differentiation is within normal  limits throughout the brain. No cortical encephalomalacia identified. Vascular: No suspicious intracranial vascular hyperdensity. Skull: No acute osseous abnormality identified. Sinuses/Orbits: Bubbly opacity in both maxillary sinuses and the right sphenoid sinus. Fluid level in the right sphenoid. Mild to moderate paranasal sinus mucosal thickening elsewhere. Chronic appearing opacification and sclerosis of the right mastoid air cells. Left mastoids and both tympanic cavities are clear. Other: No acute orbit or scalp soft tissue findings. IMPRESSION: 1. Negative noncontrast CT appearance of the brain. 2. Paranasal sinus disease with some fluid levels and bubbly opacity suggesting acute sinusitis. Electronically Signed   By: [name] M.D.   On: 03/31/2016 16:52    I have reviewed the patient's current medications.  Assessment/Plan: 1 ESRD for access, HD on Tues 2 Social issues per SW 3 HTN bp controlled with Hd 4 Anemia esa 5 HPTH vit D P HD, esa, vit D, access    LOS: 4 days   Hulet Ehrmann L 04/02/2016,11:44 AM

## 2016-04-02 NOTE — Progress Notes (Signed)
PROGRESS NOTE    Dylan [name]  HQI:[id number] DOB: [birth year] DOA: 03/29/2016 PCP: No primary care provider on file.   Brief Narrative:  Pt is a 55M with a PMH significant for HTN and CKD GV who is now seen in consultation at the request of Dr. Ron Parker of the ED for evaluation and management of progressive CKD and hyperkalemia.  Pt is originally from [place] and is here on vacation with his family, arrived on Nov 20th planning to leave on Dec 7th. [name] is a physician (pediatrician) and helps provide the history.  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  He had an acute illness in February (in [place]) with fever, vomiting, and diarrhea.  He went to the hospital and was told he needed dialysis.  He had two dialysis treatments and recovered enough renal function so that he didn't need permanent dialysis.  He was told to get a fistula placement but he wasn't able to find all of the supplies needed to schedule the surgery [place]. Two days ago he reported a cough and sore throat and so has been getting amoxicillin 500 mg daily. In ER found to have creatinine of K >7.5 and creatinine of 12.5, BUN 144  Assessment & Plan:     Progressive CKD 5 now ESRD -with hyperkalemia and symptoms of uremia-improving -s/p urgent HD catheter placement and started HD 11/30 -undergoing daily HD till yesterday -Near syncopal and BP 60 after HD 12/2 given 500cc NS bolus -CM and CSW consulted, very complicated logistics with needing longterm HD now -Renal following, CLIP started -plan for AVF tuesday -disposition unknown    Hyperkalemia -s/p kayexalate -corrected with HD    HTN (hypertension) - BP was running low -nifedipine and coreg stopped -higher today, monitor    History of anemia due to CKD -and iron defi -aranesp with HD  DVT prophylaxis:Hep SQ Code Status:FUll COde Family Communication:Family at bedside Disposition Plan: To be determined   Consultants:    Renal   Procedures: HD catheter  Antimicrobials:  Subjective: Feels better, no dizziness  Objective: Vitals:   04/01/16 1658 04/01/16 2242 04/02/16 0604 04/02/16 0939  BP: (!) 144/89 (!) 157/92 (!) 166/89 (!) 143/82  Pulse: 72 69 67 81  Resp: 18 18 16 18   Temp: 99.3 F (37.4 C) 97.6 F (36.4 C) 98 F (36.7 C) 98.6 F (37 C)  TempSrc: Oral Oral Oral Oral  SpO2: 99% 100% 99% 100%  Weight:  82.4 kg (181 lb 9.6 oz)    Height:        Intake/Output Summary (Last 24 hours) at 04/02/16 1140 Last data filed at 04/02/16 1047  Gross per 24 hour  Intake              840 ml  Output              310 ml  Net              530 ml   Filed Weights   04/01/16 0639 04/01/16 0944 04/01/16 2242  Weight: 84.2 kg (185 lb 10 oz) 81.6 kg (179 lb 14.3 oz) 82.4 kg (181 lb 9.6 oz)    Examination:  General exam: Appears calm and comfortable, AAOx3, no distress, seen in HD HEENT: HD catheter in neck Respiratory system: Clear to auscultation. Respiratory effort normal. Cardiovascular system: S1 & S2 heard, RRR. No JVD, murmurs, rubs, gallops or clicks. No pedal edema. Gastrointestinal system: Abdomen is nondistended, soft and  nontender. Normal bowel sounds heard. Central nervous system: Alert and oriented. No focal neurological deficits. Extremities: Symmetric 5 x 5 power. Skin: No rashes, lesions or ulcers Psychiatry: flat affect    Data Reviewed: I have personally reviewed following labs and imaging studies  CBC:  Recent Labs Lab 03/29/16 1625 03/29/16 1834 03/30/16 0548 03/31/16 0447 04/01/16 0443  WBC 8.7  --  9.5 7.6 9.5  NEUTROABS 7.2  --   --   --   --   HGB 9.5* 8.8* 9.2* 9.3* 10.4*  HCT 29.4* 26.0* 28.4* 27.9* 31.6*  MCV 88.0  --  86.9 86.4 86.6  PLT 247  --  283 287 818   Basic Metabolic Panel:  Recent Labs Lab 03/29/16 2300 03/30/16 0548 03/30/16 1316 03/31/16 0447 03/31/16 0730 04/01/16 0443  NA 135 136 138 138  --  135  K 6.2* 6.0* 4.7 5.4*  --  4.5   CL 113* 112* 110 108  --  99*  CO2 9* 12* 17* 16*  --  21*  GLUCOSE 122* 78 102* 83  --  89  BUN 149* 147* 98* 108*  --  79*  CREATININE 11.62* 11.75* 8.46* 9.50*  --  8.06*  CALCIUM 8.9 8.6* 8.2* 8.5*  --  8.7*  PHOS 5.6*  --   --   --  5.8*  --    GFR: Estimated Creatinine Clearance: 10.1 mL/min (by C-G formula based on SCr of 8.06 mg/dL (H)). Liver Function Tests:  Recent Labs Lab 03/29/16 1625 03/30/16 0548 03/30/16 0722  AST 22 22  --   ALT 21 21 20   ALKPHOS 57 49  --   BILITOT 0.3 0.4  --   PROT 6.0* 6.2*  --   ALBUMIN 2.8* 2.7*  --    No results for input(s): LIPASE, AMYLASE in the last 168 hours. No results for input(s): AMMONIA in the last 168 hours. Coagulation Profile: No results for input(s): INR, PROTIME in the last 168 hours. Cardiac Enzymes: No results for input(s): CKTOTAL, CKMB, CKMBINDEX, TROPONINI in the last 168 hours. BNP (last 3 results) No results for input(s): PROBNP in the last 8760 hours. HbA1C: No results for input(s): HGBA1C in the last 72 hours. CBG: No results for input(s): GLUCAP in the last 168 hours. Lipid Profile: No results for input(s): CHOL, HDL, LDLCALC, TRIG, CHOLHDL, LDLDIRECT in the last 72 hours. Thyroid Function Tests: No results for input(s): TSH, T4TOTAL, FREET4, T3FREE, THYROIDAB in the last 72 hours. Anemia Panel: No results for input(s): VITAMINB12, FOLATE, FERRITIN, TIBC, IRON, RETICCTPCT in the last 72 hours. Urine analysis: No results found for: COLORURINE, APPEARANCEUR, LABSPEC, [place], GLUCOSEU, HGBUR, BILIRUBINUR, KETONESUR, PROTEINUR, UROBILINOGEN, NITRITE, LEUKOCYTESUR Sepsis Labs: @LABRCNTIP (procalcitonin:4,lacticidven:4)  ) Recent Results (from the past 240 hour(s))  Rapid strep screen (not at [facility])     Status: None   Collection Time: 03/30/16  4:13 AM  Result Value Ref Range Status   Streptococcus, Group A Screen (Direct) NEGATIVE NEGATIVE Final    Comment: (NOTE) A Rapid Antigen test may result  negative if the antigen level in the sample is below the detection level of this test. The FDA has not cleared this test as a stand-alone test therefore the rapid antigen negative result has reflexed to a Group A Strep culture.   Culture, group A strep     Status: None   Collection Time: 03/30/16  4:13 AM  Result Value Ref Range Status   Specimen Description THROAT  Final   Special Requests  NONE Reflexed from [id number]  Final   Culture NO GROUP A STREP (S.PYOGENES) ISOLATED  Final   Report Status 04/01/2016 FINAL  Final  MRSA PCR Screening     Status: None   Collection Time: 03/30/16  4:30 AM  Result Value Ref Range Status   MRSA by PCR NEGATIVE NEGATIVE Final    Comment:        The GeneXpert MRSA Assay (FDA approved for NASAL specimens only), is one component of a comprehensive MRSA colonization surveillance program. It is not intended to diagnose MRSA infection nor to guide or monitor treatment for MRSA infections.          Radiology Studies: Ct Head Wo Contrast  Result Date: 03/31/2016 CLINICAL DATA:  55 year old male with progressive chronic renal disease and hyperkalemia. Seizures. Initial encounter. EXAM: CT HEAD WITHOUT CONTRAST TECHNIQUE: Contiguous axial images were obtained from the base of the skull through the vertex without intravenous contrast. COMPARISON:  None. FINDINGS: Brain: No midline shift, ventriculomegaly, mass effect, evidence of mass lesion, intracranial hemorrhage or evidence of cortically based acute infarction. Gray-white matter differentiation is within normal limits throughout the brain. No cortical encephalomalacia identified. Vascular: No suspicious intracranial vascular hyperdensity. Skull: No acute osseous abnormality identified. Sinuses/Orbits: Bubbly opacity in both maxillary sinuses and the right sphenoid sinus. Fluid level in the right sphenoid. Mild to moderate paranasal sinus mucosal thickening elsewhere. Chronic appearing opacification and  sclerosis of the right mastoid air cells. Left mastoids and both tympanic cavities are clear. Other: No acute orbit or scalp soft tissue findings. IMPRESSION: 1. Negative noncontrast CT appearance of the brain. 2. Paranasal sinus disease with some fluid levels and bubbly opacity suggesting acute sinusitis. Electronically Signed   By: [name] M.D.   On: 03/31/2016 16:52        Scheduled Meds: . darbepoetin (ARANESP) injection - DIALYSIS  150 mcg Intravenous Q Tue-HD  . ferric gluconate (FERRLECIT/NULECIT) IV  125 mg Intravenous Q T,Th,Sa-HD  . heparin  5,000 Units Subcutaneous Q8H  . multivitamin  1 tablet Oral QHS   Continuous Infusions:   LOS: 4 days    Time spent: 25min    Domenic Polite, MD [facility] Pager [phone]  If 7PM-7AM, please contact night-coverage [email or web address] Password [email or web address] 04/02/2016, 11:40 AM

## 2016-04-02 NOTE — Progress Notes (Signed)
  Progress Note    04/02/2016 10:09 AM * No surgery found *  Subjective:  No complaints today  Vitals:   04/02/16 0604 04/02/16 0939  BP: (!) 166/89 (!) 143/82  Pulse: 67 81  Resp: 16 18  Temp: 98 F (36.7 C) 98.6 F (37 C)    Physical Exam: [email or web address] Left arm palpable radial artery  CBC    Component Value Date/Time   WBC 9.5 04/01/2016 0443   RBC 3.65 (L) 04/01/2016 0443   HGB 10.4 (L) 04/01/2016 0443   HCT 31.6 (L) 04/01/2016 0443   PLT 349 04/01/2016 0443   MCV 86.6 04/01/2016 0443   MCH 28.5 04/01/2016 0443   MCHC 32.9 04/01/2016 0443   RDW 15.4 04/01/2016 0443   LYMPHSABS 1.0 03/29/2016 1625   MONOABS 0.2 03/29/2016 1625   EOSABS 0.2 03/29/2016 1625   BASOSABS 0.0 03/29/2016 1625    BMET    Component Value Date/Time   NA 135 04/01/2016 0443   K 4.5 04/01/2016 0443   CL 99 (L) 04/01/2016 0443   CO2 21 (L) 04/01/2016 0443   GLUCOSE 89 04/01/2016 0443   BUN 79 (H) 04/01/2016 0443   CREATININE 8.06 (H) 04/01/2016 0443   CALCIUM 8.7 (L) 04/01/2016 0443   GFRNONAA 7 (L) 04/01/2016 0443   GFRAA 8 (L) 04/01/2016 0443    INR No results found for: INR   Intake/Output Summary (Last 24 hours) at 04/02/16 1009 Last data filed at 04/02/16 0605  Gross per 24 hour  Intake              600 ml  Output              310 ml  Net              290 ml     Assessment:  55 y.o. male is here with esrd, has temp right ij catheter, will need permanent left arm access and tdc  Plan: Will schedule for right ij tdc and left arm avf vs avg at soonest possible time.   Lucianna Ostlund C. Donzetta Matters, MD [facility] Office: [phone] Pager: [phone]  04/02/2016 10:09 AM

## 2016-04-03 NOTE — Progress Notes (Signed)
Patient ID: Dylan [name], male   DOB: 1961-04-01, 55 y.o.   MRN: [id number]  [facility] Progress Note    Subjective:   Feels better.  Spoke to patient via daughter.   Objective:   BP 138/86 (BP Location: Right Arm)   Pulse 88   Temp 98 F (36.7 C) (Oral)   Resp 17   Ht 5\' 5"  (1.651 m)   Wt 82 kg (180 lb 12.4 oz)   SpO2 100%   BMI 30.08 kg/m   Intake/Output: I/O last 3 completed shifts: In: 830 [P.O.:830] Out: 710 [Urine:710]   Intake/Output this shift:  Total I/O In: 240 [P.O.:240] Out: 225 [Urine:225] Weight change: 0.4 kg (14.1 oz)  Physical Exam: Gen:WD WN HM in NAd CVS:no rub Resp:cta CXK:GYJEHU Ext:no edema  Labs: BMET  Recent Labs Lab 03/29/16 1625 03/29/16 1834 03/29/16 2300 03/30/16 0548 03/30/16 1316 03/31/16 0447 03/31/16 0730 04/01/16 0443  NA 135 136 135 136 138 138  --  135  K >7.5* 7.4* 6.2* 6.0* 4.7 5.4*  --  4.5  CL 112* 118* 113* 112* 110 108  --  99*  CO2 13*  --  9* 12* 17* 16*  --  21*  GLUCOSE 103* 103* 122* 78 102* 83  --  89  BUN 144* >140* 149* 147* 98* 108*  --  79*  CREATININE 11.49* 12.50* 11.62* 11.75* 8.46* 9.50*  --  8.06*  ALBUMIN 2.8*  --   --  2.7*  --   --   --   --   CALCIUM 8.3*  --  8.9 8.6* 8.2* 8.5*  --  8.7*  PHOS  --   --  5.6*  --   --   --  5.8*  --    CBC  Recent Labs Lab 03/29/16 1625 03/29/16 1834 03/30/16 0548 03/31/16 0447 04/01/16 0443  WBC 8.7  --  9.5 7.6 9.5  NEUTROABS 7.2  --   --   --   --   HGB 9.5* 8.8* 9.2* 9.3* 10.4*  HCT 29.4* 26.0* 28.4* 27.9* 31.6*  MCV 88.0  --  86.9 86.4 86.6  PLT 247  --  283 287 349    @IMGRELPRIORS @ Medications:    . darbepoetin (ARANESP) injection - DIALYSIS  150 mcg Intravenous Q Tue-HD  . ferric gluconate (FERRLECIT/NULECIT) IV  125 mg Intravenous Q T,Th,Sa-HD  . heparin  5,000 Units Subcutaneous Q8H  . multivitamin  1 tablet Oral QHS     Assessment/ Plan:   1. Vascular access- for [facility] and AVG on 04/06/16 2. ESRD cont with HD  qTTS for now 3. Anemia:on ESA 4. CKD-MBD: follow ca/phos/iPTH 5. Nutrition: renal  6. Hypertension: stable 7. Seizures 8. Disposition- unclear what the plan will be and likely will need medical asylum for management of his ESRD.  SW will need to assist with planning.  Donetta Potts, MD [place] Pager [phone] 04/03/2016, 11:38 AM

## 2016-04-03 NOTE — Progress Notes (Signed)
   Patient has been scheduled for right ij tdc and left arm avf vs avg on Thursday 12.7 If questions or discharged prior please call.  Contrina Orona C. Donzetta Matters, MD [facility] [place] Office: [phone] Pager: [phone]

## 2016-04-03 NOTE — Progress Notes (Signed)
PROGRESS NOTE    Dylan [name]  TKW:[id number] DOB: 11-01-1960 DOA: 03/29/2016 PCP: No primary care provider on file.   Brief Narrative:  Pt is a 39M with a PMH significant for HTN and CKD GV who is now seen in consultation at the request of Dr. Ron Parker of the ED for evaluation and management of progressive CKD and hyperkalemia.  Pt is originally from [place] and is here on vacation with his family, arrived on Nov 20th planning to leave on Dec 7th. [name] is a physician (pediatrician) and helps provide the history.  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  He had an acute illness in February (in [place]) with fever, vomiting, and diarrhea.  He went to the hospital and was told he needed dialysis.  He had two dialysis treatments and recovered enough renal function so that he didn't need permanent dialysis.  He was told to get a fistula placement but he wasn't able to find all of the supplies needed to schedule the surgery [place] country. Two days ago he reported a cough and sore throat and so has been getting amoxicillin 500 mg daily. In ER found to have creatinine of K >7.5 and creatinine of 12.5, BUN 144. HD catheter placed and started HD  Assessment & Plan:     Progressive CKD 5 now ESRD -with hyperkalemia and symptoms of uremia-improving -s/p urgent HD catheter placement and started HD 11/30 -undergoing daily HD till yesterday -Near syncopal and BP 60 after HD 12/2 given 500cc NS bolus -CM and CSW consulted, very complicated logistics with needing longterm HD now -Renal following, CLIP process has not started yet -plan for AVF tomorrow -disposition unknown    Hyperkalemia -s/p kayexalate -corrected with HD    HTN (hypertension) - BP was running low -nifedipine and coreg stopped -monitor off meds    History of anemia due to CKD -and iron defi -aranesp with HD  DVT prophylaxis:Hep SQ Code Status:FUll COde Family Communication:Family at bedside Disposition  Plan: To be determined   Consultants:   Renal   Procedures: HD catheter  Antimicrobials:  Subjective: Feels better, no dizziness  Objective: Vitals:   04/02/16 1702 04/02/16 2100 04/03/16 0512 04/03/16 0901  BP: 133/89 (!) 153/90 (!) 160/99 138/86  Pulse: 87 79 98 88  Resp: 18 16 16 17   Temp: 98.7 F (37.1 C) 98.8 F (37.1 C) 97.6 F (36.4 C) 98 F (36.7 C)  TempSrc: Oral Oral Oral Oral  SpO2: 100% 100% 99% 100%  Weight:  82 kg (180 lb 12.4 oz)    Height:        Intake/Output Summary (Last 24 hours) at 04/03/16 1218 Last data filed at 04/03/16 0930  Gross per 24 hour  Intake              710 ml  Output              625 ml  Net               85 ml   Filed Weights   04/01/16 0944 04/01/16 2242 04/02/16 2100  Weight: 81.6 kg (179 lb 14.3 oz) 82.4 kg (181 lb 9.6 oz) 82 kg (180 lb 12.4 oz)    Examination:  General exam: Appears calm and comfortable, AAOx3, no distress,  HEENT: HD catheter in neck Respiratory system: Clear to auscultation. Respiratory effort normal. Cardiovascular system: S1 & S2 heard, RRR. No JVD, murmurs, rubs, gallops or clicks. No pedal edema.  Gastrointestinal system: Abdomen is nondistended, soft and nontender. Normal bowel sounds heard. Central nervous system: Alert and oriented. No focal neurological deficits. Extremities: Symmetric 5 x 5 power. Skin: No rashes, lesions or ulcers Psychiatry: flat affect    Data Reviewed: I have personally reviewed following labs and imaging studies  CBC:  Recent Labs Lab 03/29/16 1625 03/29/16 1834 03/30/16 0548 03/31/16 0447 04/01/16 0443  WBC 8.7  --  9.5 7.6 9.5  NEUTROABS 7.2  --   --   --   --   HGB 9.5* 8.8* 9.2* 9.3* 10.4*  HCT 29.4* 26.0* 28.4* 27.9* 31.6*  MCV 88.0  --  86.9 86.4 86.6  PLT 247  --  283 287 416   Basic Metabolic Panel:  Recent Labs Lab 03/29/16 2300 03/30/16 0548 03/30/16 1316 03/31/16 0447 03/31/16 0730 04/01/16 0443  NA 135 136 138 138  --  135  K 6.2*  6.0* 4.7 5.4*  --  4.5  CL 113* 112* 110 108  --  99*  CO2 9* 12* 17* 16*  --  21*  GLUCOSE 122* 78 102* 83  --  89  BUN 149* 147* 98* 108*  --  79*  CREATININE 11.62* 11.75* 8.46* 9.50*  --  8.06*  CALCIUM 8.9 8.6* 8.2* 8.5*  --  8.7*  PHOS 5.6*  --   --   --  5.8*  --    GFR: Estimated Creatinine Clearance: 10.1 mL/min (by C-G formula based on SCr of 8.06 mg/dL (H)). Liver Function Tests:  Recent Labs Lab 03/29/16 1625 03/30/16 0548 03/30/16 0722  AST 22 22  --   ALT 21 21 20   ALKPHOS 57 49  --   BILITOT 0.3 0.4  --   PROT 6.0* 6.2*  --   ALBUMIN 2.8* 2.7*  --    No results for input(s): LIPASE, AMYLASE in the last 168 hours. No results for input(s): AMMONIA in the last 168 hours. Coagulation Profile: No results for input(s): INR, PROTIME in the last 168 hours. Cardiac Enzymes: No results for input(s): CKTOTAL, CKMB, CKMBINDEX, TROPONINI in the last 168 hours. BNP (last 3 results) No results for input(s): PROBNP in the last 8760 hours. HbA1C: No results for input(s): HGBA1C in the last 72 hours. CBG: No results for input(s): GLUCAP in the last 168 hours. Lipid Profile: No results for input(s): CHOL, HDL, LDLCALC, TRIG, CHOLHDL, LDLDIRECT in the last 72 hours. Thyroid Function Tests: No results for input(s): TSH, T4TOTAL, FREET4, T3FREE, THYROIDAB in the last 72 hours. Anemia Panel: No results for input(s): VITAMINB12, FOLATE, FERRITIN, TIBC, IRON, RETICCTPCT in the last 72 hours. Urine analysis: No results found for: COLORURINE, APPEARANCEUR, LABSPEC, [place], GLUCOSEU, HGBUR, BILIRUBINUR, KETONESUR, PROTEINUR, UROBILINOGEN, NITRITE, LEUKOCYTESUR Sepsis Labs: @LABRCNTIP (procalcitonin:4,lacticidven:4)  ) Recent Results (from the past 240 hour(s))  Rapid strep screen (not at [facility])     Status: None   Collection Time: 03/30/16  4:13 AM  Result Value Ref Range Status   Streptococcus, Group A Screen (Direct) NEGATIVE NEGATIVE Final    Comment: (NOTE) A Rapid Antigen  test may result negative if the antigen level in the sample is below the detection level of this test. The FDA has not cleared this test as a stand-alone test therefore the rapid antigen negative result has reflexed to a Group A Strep culture.   Culture, group A strep     Status: None   Collection Time: 03/30/16  4:13 AM  Result Value Ref Range Status   Specimen Description  THROAT  Final   Special Requests NONE Reflexed from [id number]  Final   Culture NO GROUP A STREP (S.PYOGENES) ISOLATED  Final   Report Status 04/01/2016 FINAL  Final  MRSA PCR Screening     Status: None   Collection Time: 03/30/16  4:30 AM  Result Value Ref Range Status   MRSA by PCR NEGATIVE NEGATIVE Final    Comment:        The GeneXpert MRSA Assay (FDA approved for NASAL specimens only), is one component of a comprehensive MRSA colonization surveillance program. It is not intended to diagnose MRSA infection nor to guide or monitor treatment for MRSA infections.          Radiology Studies: No results found.      Scheduled Meds: . darbepoetin (ARANESP) injection - DIALYSIS  150 mcg Intravenous Q Tue-HD  . ferric gluconate (FERRLECIT/NULECIT) IV  125 mg Intravenous Q T,Th,Sa-HD  . heparin  5,000 Units Subcutaneous Q8H  . multivitamin  1 tablet Oral QHS   Continuous Infusions:   LOS: 5 days    Time spent: 90min    Domenic Polite, MD [facility] Pager [phone]  If 7PM-7AM, please contact night-coverage [email or web address] Password [email or web address] 04/03/2016, 12:18 PM

## 2016-04-04 ENCOUNTER — Encounter (HOSPITAL_COMMUNITY): Payer: Self-pay | Admitting: *Deleted

## 2016-04-04 LAB — RENAL FUNCTION PANEL
Albumin: 3 g/dL — ABNORMAL LOW (ref 3.5–5.0)
Anion gap: 16 — ABNORMAL HIGH (ref 5–15)
BUN: 109 mg/dL — ABNORMAL HIGH (ref 6–20)
CO2: 17 mmol/L — ABNORMAL LOW (ref 22–32)
Calcium: 8.4 mg/dL — ABNORMAL LOW (ref 8.9–10.3)
Chloride: 98 mmol/L — ABNORMAL LOW (ref 101–111)
Creatinine, Ser: 11.12 mg/dL — ABNORMAL HIGH (ref 0.61–1.24)
GFR calc Af Amer: 5 mL/min — ABNORMAL LOW (ref >60)
GFR calc non Af Amer: 4 mL/min — ABNORMAL LOW (ref >60)
Glucose, Bld: 99 mg/dL (ref 65–99)
Phosphorus: 9.8 mg/dL — ABNORMAL HIGH (ref 2.5–4.6)
Potassium: 5.2 mmol/L — ABNORMAL HIGH (ref 3.5–5.1)
Sodium: 131 mmol/L — ABNORMAL LOW (ref 135–145)

## 2016-04-04 LAB — CBC
HCT: 27.2 % — ABNORMAL LOW (ref 39.0–52.0)
Hemoglobin: 9 g/dL — ABNORMAL LOW (ref 13.0–17.0)
MCH: 28.1 pg (ref 26.0–34.0)
MCHC: 33.1 g/dL (ref 30.0–36.0)
MCV: 85 fL (ref 78.0–100.0)
Platelets: 520 10*3/uL — ABNORMAL HIGH (ref 150–400)
RBC: 3.2 MIL/uL — ABNORMAL LOW (ref 4.22–5.81)
RDW: 14.5 % (ref 11.5–15.5)
WBC: 10.7 10*3/uL — ABNORMAL HIGH (ref 4.0–10.5)

## 2016-04-04 MED ORDER — DARBEPOETIN ALFA 150 MCG/0.3ML IJ SOSY
PREFILLED_SYRINGE | INTRAMUSCULAR | Status: AC
  Filled 2016-04-04: qty 0.3

## 2016-04-04 MED ORDER — ACETAMINOPHEN 325 MG PO TABS
650.0000 mg | ORAL_TABLET | Freq: Four times a day (QID) | ORAL | Status: DC | PRN
  Filled 2016-04-04: qty 2

## 2016-04-04 MED ORDER — UNABLE TO FIND: 0 refills | Status: DC

## 2016-04-04 NOTE — Procedures (Signed)
I was present at this dialysis session. I have reviewed the session itself and made appropriate changes.   Filed Weights   04/02/16 2100 04/03/16 2156 04/04/16 1145  Weight: 82 kg (180 lb 12.4 oz) 84.5 kg (186 lb 4.6 oz) 85 kg (187 lb 6.3 oz)     Recent Labs Lab 04/04/16 1207  NA 131*  K 5.2*  CL 98*  CO2 17*  GLUCOSE 99  BUN 109*  CREATININE 11.12*  CALCIUM 8.4*  PHOS 9.8*     Recent Labs Lab 03/29/16 1625  03/31/16 0447 04/01/16 0443 04/04/16 1201  WBC 8.7  < > 7.6 9.5 10.7*  NEUTROABS 7.2  --   --   --   --   HGB 9.5*  < > 9.3* 10.4* 9.0*  HCT 29.4*  < > 27.9* 31.6* 27.2*  MCV 88.0  < > 86.4 86.6 85.0  PLT 247  < > 287 349 520*  < > = values in this interval not displayed.  Scheduled Meds: . darbepoetin (ARANESP) injection - DIALYSIS  150 mcg Intravenous Q Tue-HD  . ferric gluconate (FERRLECIT/NULECIT) IV  125 mg Intravenous Q T,Th,Sa-HD  . heparin  5,000 Units Subcutaneous Q8H  . multivitamin  1 tablet Oral QHS   Continuous Infusions: PRN Meds:.calcium carbonate, hydrALAZINE, ondansetron (ZOFRAN) IV, sodium chloride flush    Assessment/Plan:  1. Vascular access- for [facility] and AVG on 04/06/16 2. ESRD cont with HD qTTS for now 3. Anemia:on ESA 4. CKD-MBD: follow ca/phos/iPTH 5. Nutrition: renal  6. Hypertension: stable 7. Seizures 8. Disposition- unclear what the plan will be and likely will need medical asylum for management of his ESRD. SW will need to assist with planning.  Donetta Potts,  MD 04/04/2016, 12:50 PM

## 2016-04-04 NOTE — Progress Notes (Signed)
PROGRESS NOTE    Dylan [name]  EAO:[id number] DOB: 08/21/1960 DOA: 03/29/2016 PCP: No primary care provider on file.   Brief Narrative:  Pt is a 19M with a PMH significant for HTN and CKD GV who is now seen in consultation at the request of Dr. Myrtis Ser of the ED for evaluation and management of progressive CKD and hyperkalemia.  Pt is originally from [place] and is here on vacation with his family, arrived on Nov 20th planning to leave on Dec 7th. [name] is a physician (pediatrician) and helps provide the history.  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  He had an acute illness in February (in [place]) with fever, vomiting, and diarrhea.  He went to the hospital and was told he needed dialysis.  He had two dialysis treatments and recovered enough renal function so that he didn't need permanent dialysis.  He was told to get a fistula placement but he wasn't able to find all of the supplies needed to [place] the surgery [place]. Two days ago he reported a cough and sore throat and so has been getting amoxicillin 500 mg daily. In ER found to have creatinine of K >7.5 and creatinine of 12.5, BUN 144. HD catheter placed and started HD, now ESRD needs long term HD, logistics   Assessment & Plan:     Progressive CKD 5 now ESRD -with hyperkalemia and symptoms of uremia-improving -s/p urgent HD catheter placement and started HD 11/30 -undergoing daily HD till 12/3 -Near syncopal and BP 60 after HD 12/2 given 500cc NS bolus -CM and CSW consulted, very complicated logistics with needing longterm HD now -Renal following, CLIP process to start, patient and family met with fiaci -plan for AVF Thursday per [facility] -disposition unknown    Hyperkalemia -s/p kayexalate -corrected with HD    HTN (hypertension) - BP was running low -nifedipine and coreg stopped -monitor off meds    History of anemia due to CKD -and iron defi -aranesp with HD  DVT prophylaxis:Hep SQ Code  Status:FUll COde Family Communication:daughter Rut and extended Family at bedside Disposition Plan: To be determined   Consultants:   Renal   Procedures: HD catheter 11/29 per PCCM  Antimicrobials:  Subjective: Feels well, no complaints  Objective: Vitals:   04/04/16 1245 04/04/16 1300 04/04/16 1330 04/04/16 1400  BP: (!) 169/98 (!) 155/89 (!) 160/92 (!) 152/95  Pulse: 78 81 83 90  Resp:      Temp:      TempSrc:      SpO2:      Weight:      Height:        Intake/Output Summary (Last 24 hours) at 04/04/16 1428 Last data filed at 04/04/16 1000  Gross per 24 hour  Intake              460 ml  Output             1775 ml  Net            -1315 ml   Filed Weights   04/02/16 2100 04/03/16 2156 04/04/16 1145  Weight: 82 kg (180 lb 12.4 oz) 84.5 kg (186 lb 4.6 oz) 85 kg (187 lb 6.3 oz)    Examination:  General exam: Appears calm and comfortable, AAOx3, no distress,  HEENT: HD catheter in neck Respiratory system: Clear to auscultation. Respiratory effort normal. Cardiovascular system: S1 & S2 heard, RRR. No JVD, murmurs, rubs, gallops or clicks. No  pedal edema. Gastrointestinal system: Abdomen is nondistended, soft and nontender. Normal bowel sounds heard. Central nervous system: Alert and oriented. No focal neurological deficits. Extremities: Symmetric 5 x 5 power. Skin: No rashes, lesions or ulcers Psychiatry: flat affect    Data Reviewed: I have personally reviewed following labs and imaging studies  CBC:  Recent Labs Lab 03/29/16 1625 03/29/16 1834 03/30/16 0548 03/31/16 0447 04/01/16 0443 04/04/16 1201  WBC 8.7  --  9.5 7.6 9.5 10.7*  NEUTROABS 7.2  --   --   --   --   --   HGB 9.5* 8.8* 9.2* 9.3* 10.4* 9.0*  HCT 29.4* 26.0* 28.4* 27.9* 31.6* 27.2*  MCV 88.0  --  86.9 86.4 86.6 85.0  PLT 247  --  283 287 349 161*   Basic Metabolic Panel:  Recent Labs Lab 03/29/16 2300 03/30/16 0548 03/30/16 1316 03/31/16 0447 03/31/16 0730 04/01/16 0443  04/04/16 1207  NA 135 136 138 138  --  135 131*  K 6.2* 6.0* 4.7 5.4*  --  4.5 5.2*  CL 113* 112* 110 108  --  99* 98*  CO2 9* 12* 17* 16*  --  21* 17*  GLUCOSE 122* 78 102* 83  --  89 99  BUN 149* 147* 98* 108*  --  79* 109*  CREATININE 11.62* 11.75* 8.46* 9.50*  --  8.06* 11.12*  CALCIUM 8.9 8.6* 8.2* 8.5*  --  8.7* 8.4*  PHOS 5.6*  --   --   --  5.8*  --  9.8*   GFR: Estimated Creatinine Clearance: 7.4 mL/min (by C-G formula based on SCr of 11.12 mg/dL (H)). Liver Function Tests:  Recent Labs Lab 03/29/16 1625 03/30/16 0548 03/30/16 0722 04/04/16 1207  AST 22 22  --   --   ALT '21 21 20  '$ --   ALKPHOS 57 49  --   --   BILITOT 0.3 0.4  --   --   PROT 6.0* 6.2*  --   --   ALBUMIN 2.8* 2.7*  --  3.0*   No results for input(s): LIPASE, AMYLASE in the last 168 hours. No results for input(s): AMMONIA in the last 168 hours. Coagulation Profile: No results for input(s): INR, PROTIME in the last 168 hours. Cardiac Enzymes: No results for input(s): CKTOTAL, CKMB, CKMBINDEX, TROPONINI in the last 168 hours. BNP (last 3 results) No results for input(s): PROBNP in the last 8760 hours. HbA1C: No results for input(s): HGBA1C in the last 72 hours. CBG: No results for input(s): GLUCAP in the last 168 hours. Lipid Profile: No results for input(s): CHOL, HDL, LDLCALC, TRIG, CHOLHDL, LDLDIRECT in the last 72 hours. Thyroid Function Tests: No results for input(s): TSH, T4TOTAL, FREET4, T3FREE, THYROIDAB in the last 72 hours. Anemia Panel: No results for input(s): VITAMINB12, FOLATE, FERRITIN, TIBC, IRON, RETICCTPCT in the last 72 hours. Urine analysis: No results found for: COLORURINE, APPEARANCEUR, LABSPEC, PHURINE, GLUCOSEU, HGBUR, BILIRUBINUR, KETONESUR, PROTEINUR, UROBILINOGEN, NITRITE, LEUKOCYTESUR Sepsis Labs: '@LABRCNTIP'$ (procalcitonin:4,lacticidven:4)  ) Recent Results (from the past 240 hour(s))  Rapid strep screen (not at [facility])     Status: None   Collection Time: 03/30/16   4:13 AM  Result Value Ref Range Status   Streptococcus, Group A Screen (Direct) NEGATIVE NEGATIVE Final    Comment: (NOTE) A Rapid Antigen test may result negative if the antigen level in the sample is below the detection level of this test. The FDA has not cleared this test as a stand-alone test therefore the rapid  antigen negative result has reflexed to a Group A Strep culture.   Culture, group A strep     Status: None   Collection Time: 03/30/16  4:13 AM  Result Value Ref Range Status   Specimen Description THROAT  Final   Special Requests NONE Reflexed from [id number]  Final   Culture NO GROUP A STREP (S.PYOGENES) ISOLATED  Final   Report Status 04/01/2016 FINAL  Final  MRSA PCR Screening     Status: None   Collection Time: 03/30/16  4:30 AM  Result Value Ref Range Status   MRSA by PCR NEGATIVE NEGATIVE Final    Comment:        The GeneXpert MRSA Assay (FDA approved for NASAL specimens only), is one component of a comprehensive MRSA colonization surveillance program. It is not intended to diagnose MRSA infection nor to guide or monitor treatment for MRSA infections.          Radiology Studies: No results found.      Scheduled Meds: . darbepoetin (ARANESP) injection - DIALYSIS  150 mcg Intravenous Q Tue-HD  . ferric gluconate (FERRLECIT/NULECIT) IV  125 mg Intravenous Q T,Th,Sa-HD  . heparin  5,000 Units Subcutaneous Q8H  . multivitamin  1 tablet Oral QHS   Continuous Infusions:   LOS: 6 days    Time spent: 11mn    PDomenic Polite MD [facility] Pager [phone] If 7PM-7AM, please contact night-coverage [email or web address] Password [email or web address] 04/04/2016, 2:28 PM

## 2016-04-04 NOTE — Clinical Social Work Note (Signed)
CSW talked with Porchia, financial counselor regarding seeing patient.  CSW informed that she has met with patient regarding emergency Medicaid application and is working on everything needed to be submitted to [place] worker.  Pierra Skora Givens, MSW, LCSW Licensed Clinical Social Worker [place] [phone]

## 2016-04-05 LAB — SURGICAL PCR SCREEN
MRSA, PCR: NEGATIVE
Staphylococcus aureus: NEGATIVE

## 2016-04-05 MED ORDER — CEFAZOLIN IN D5W 1 GM/50ML IV SOLN
1.0000 g | INTRAVENOUS | Status: AC
  Administered 2016-04-06: 2 g via INTRAVENOUS
  Filled 2016-04-05: qty 50

## 2016-04-05 NOTE — Progress Notes (Signed)
PROGRESS NOTE    Dylan [name]  CBU:[id number] DOB: [birth year] DOA: 03/29/2016 PCP: No primary care provider on file.   Brief Narrative:  Pt is a 81M with a PMH significant for HTN and CKD V  originally from [place] and is here on vacation with his family, arrived on Nov 20th planning to leave on Dec 7th. [name] is a physician (pediatrician) and helps provide the history.  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  He had an acute illness in February (in [place]) had two dialysis treatments and recovered enough renal function so that he didn't need permanent dialysis.  He was told to get a fistula placement but he wasn't able to find all of the supplies needed to schedule the surgery in his country. Currently on vacation in [place] presented to ED with a cough and sore throat and so has been getting amoxicillin 500 mg daily. In ER found to have creatinine of K >7.5 and creatinine of 12.5, BUN 144. Renal consulted HD catheter placed and started HD, now ESRD needs long term HD, logistics complicated Plan for AVF 12/7  Assessment & Plan:     Progressive CKD 5 now ESRD -with hyperkalemia and symptoms of uremia-improving -s/p urgent HD catheter placement and started HD 11/30 -was undergoing daily HD till 12/3 now every other day -Near syncopal and BP 60 after HD on 12/2 given 500cc NS bolus -CM and CSW consulted, very complicated logistics with needing longterm HD now -Renal following, CLIP process yet to start, patient and family met with financial counselor -plan for AVF Thursday per [facility] -disposition unknown yet    Hyperkalemia -s/p kayexalate -corrected with HD    HTN (hypertension) - BP was running low -nifedipine and coreg stopped -monitor off meds    History of anemia due to CKD -and iron defi -aranesp with HD  DVT prophylaxis:Hep SQ Code Status:FUll COde Family Communication:daughter Rut and extended Family at bedside Disposition Plan: To be  determined   Consultants:   Renal   Procedures: HD catheter 11/29 per PCCM  Antimicrobials:  Subjective: Feels well, no complaints, wants to walk downstairs  Objective: Vitals:   04/04/16 1453 04/04/16 1809 04/04/16 2105 04/05/16 0519  BP: (!) 149/94 (!) 136/92 (!) 152/88 (!) 158/93  Pulse: 89 90 73 63  Resp: '13 16 16 16  '$ Temp: 97.8 F (36.6 C) 98.2 F (36.8 C) 98.5 F (36.9 C) 98.3 F (36.8 C)  TempSrc: Oral Oral Oral Oral  SpO2: 100% 100% 99% 100%  Weight: 82.5 kg (181 lb 14.1 oz)  84.6 kg (186 lb 8 oz)   Height:   '5\' 5"'$  (1.651 m)     Intake/Output Summary (Last 24 hours) at 04/05/16 1144 Last data filed at 04/05/16 0522  Gross per 24 hour  Intake              600 ml  Output             3246 ml  Net            -2646 ml   Filed Weights   04/04/16 1145 04/04/16 1453 04/04/16 2105  Weight: 85 kg (187 lb 6.3 oz) 82.5 kg (181 lb 14.1 oz) 84.6 kg (186 lb 8 oz)    Examination:  General exam: Appears calm and comfortable, AAOx3,  cheerful HEENT: HD catheter in neck Respiratory system: Clear to auscultation. Respiratory effort normal. Cardiovascular system: S1 & S2 heard, RRR. No JVD, murmurs, rubs, gallops or  clicks. No pedal edema. Gastrointestinal system: Abdomen is nondistended, soft and nontender. Normal bowel sounds heard. Central nervous system: Alert and oriented. No focal neurological deficits. Extremities: Symmetric 5 x 5 power. Skin: No rashes, lesions or ulcers Psychiatry: flat affect    Data Reviewed: I have personally reviewed following labs and imaging studies  CBC:  Recent Labs Lab 03/29/16 1625 03/29/16 1834 03/30/16 0548 03/31/16 0447 04/01/16 0443 04/04/16 1201  WBC 8.7  --  9.5 7.6 9.5 10.7*  NEUTROABS 7.2  --   --   --   --   --   HGB 9.5* 8.8* 9.2* 9.3* 10.4* 9.0*  HCT 29.4* 26.0* 28.4* 27.9* 31.6* 27.2*  MCV 88.0  --  86.9 86.4 86.6 85.0  PLT 247  --  283 287 349 756*   Basic Metabolic Panel:  Recent Labs Lab  03/29/16 2300 03/30/16 0548 03/30/16 1316 03/31/16 0447 03/31/16 0730 04/01/16 0443 04/04/16 1207  NA 135 136 138 138  --  135 131*  K 6.2* 6.0* 4.7 5.4*  --  4.5 5.2*  CL 113* 112* 110 108  --  99* 98*  CO2 9* 12* 17* 16*  --  21* 17*  GLUCOSE 122* 78 102* 83  --  89 99  BUN 149* 147* 98* 108*  --  79* 109*  CREATININE 11.62* 11.75* 8.46* 9.50*  --  8.06* 11.12*  CALCIUM 8.9 8.6* 8.2* 8.5*  --  8.7* 8.4*  PHOS 5.6*  --   --   --  5.8*  --  9.8*   GFR: Estimated Creatinine Clearance: 7.4 mL/min (by C-G formula based on SCr of 11.12 mg/dL (H)). Liver Function Tests:  Recent Labs Lab 03/29/16 1625 03/30/16 0548 03/30/16 0722 04/04/16 1207  AST 22 22  --   --   ALT '21 21 20  '$ --   ALKPHOS 57 49  --   --   BILITOT 0.3 0.4  --   --   PROT 6.0* 6.2*  --   --   ALBUMIN 2.8* 2.7*  --  3.0*   No results for input(s): LIPASE, AMYLASE in the last 168 hours. No results for input(s): AMMONIA in the last 168 hours. Coagulation Profile: No results for input(s): INR, PROTIME in the last 168 hours. Cardiac Enzymes: No results for input(s): CKTOTAL, CKMB, CKMBINDEX, TROPONINI in the last 168 hours. BNP (last 3 results) No results for input(s): PROBNP in the last 8760 hours. HbA1C: No results for input(s): HGBA1C in the last 72 hours. CBG: No results for input(s): GLUCAP in the last 168 hours. Lipid Profile: No results for input(s): CHOL, HDL, LDLCALC, TRIG, CHOLHDL, LDLDIRECT in the last 72 hours. Thyroid Function Tests: No results for input(s): TSH, T4TOTAL, FREET4, T3FREE, THYROIDAB in the last 72 hours. Anemia Panel: No results for input(s): VITAMINB12, FOLATE, FERRITIN, TIBC, IRON, RETICCTPCT in the last 72 hours. Urine analysis: No results found for: COLORURINE, APPEARANCEUR, LABSPEC, PHURINE, GLUCOSEU, HGBUR, BILIRUBINUR, KETONESUR, PROTEINUR, UROBILINOGEN, NITRITE, LEUKOCYTESUR Sepsis Labs: '@LABRCNTIP'$ (procalcitonin:4,lacticidven:4)  ) Recent Results (from the past 240  hour(s))  Rapid strep screen (not at [facility])     Status: None   Collection Time: 03/30/16  4:13 AM  Result Value Ref Range Status   Streptococcus, Group A Screen (Direct) NEGATIVE NEGATIVE Final    Comment: (NOTE) A Rapid Antigen test may result negative if the antigen level in the sample is below the detection level of this test. The FDA has not cleared this test as a stand-alone test therefore  the rapid antigen negative result has reflexed to a Group A Strep culture.   Culture, group A strep     Status: None   Collection Time: 03/30/16  4:13 AM  Result Value Ref Range Status   Specimen Description THROAT  Final   Special Requests NONE Reflexed from [id number]  Final   Culture NO GROUP A STREP (S.PYOGENES) ISOLATED  Final   Report Status 04/01/2016 FINAL  Final  MRSA PCR Screening     Status: None   Collection Time: 03/30/16  4:30 AM  Result Value Ref Range Status   MRSA by PCR NEGATIVE NEGATIVE Final    Comment:        The GeneXpert MRSA Assay (FDA approved for NASAL specimens only), is one component of a comprehensive MRSA colonization surveillance program. It is not intended to diagnose MRSA infection nor to guide or monitor treatment for MRSA infections.          Radiology Studies: No results found.      Scheduled Meds: . [START ON 04/06/2016]  ceFAZolin (ANCEF) IV  1 g Intravenous To OR  . darbepoetin (ARANESP) injection - DIALYSIS  150 mcg Intravenous Q Tue-HD  . ferric gluconate (FERRLECIT/NULECIT) IV  125 mg Intravenous Q T,Th,Sa-HD  . heparin  5,000 Units Subcutaneous Q8H  . multivitamin  1 tablet Oral QHS   Continuous Infusions:   LOS: 7 days    Time spent: 42mn    PDomenic Polite MD [facility] Pager [phone] If 7PM-7AM, please contact night-coverage [email or web address] Password [email or web address] 04/05/2016, 11:44 AM

## 2016-04-05 NOTE — Progress Notes (Signed)
Patient ID: Dylan [name], male   DOB: 1960-07-10, 55 y.o.   MRN: [id number] S:feels well O:BP (!) 158/93 (BP Location: Right Arm)   Pulse 63   Temp 98.3 F (36.8 C) (Oral)   Resp 16   Ht 5\' 5"  (1.651 m)   Wt 84.6 kg (186 lb 8 oz)   SpO2 100%   BMI 31.04 kg/m   Intake/Output Summary (Last 24 hours) at 04/05/16 0906 Last data filed at 04/05/16 0522  Gross per 24 hour  Intake              700 ml  Output             3246 ml  Net            -2546 ml   Intake/Output: I/O last 3 completed shifts: In: 700 [P.O.:700] Out: 1937 [Urine:2400; Other:2496]  Intake/Output this shift:  No intake/output data recorded. Weight change: 0.5 kg (1 lb 1.6 oz) Gen:wd wn hm in nad CVS:no rub Resp:cta TKW:[id number] Ext:no edema   Recent Labs Lab 03/29/16 1625 03/29/16 1834 03/29/16 2300 03/30/16 0548 03/30/16 0722 03/30/16 1316 03/31/16 0447 03/31/16 0730 04/01/16 0443 04/04/16 1207  NA 135 136 135 136  --  138 138  --  135 131*  K >7.5* 7.4* 6.2* 6.0*  --  4.7 5.4*  --  4.5 5.2*  CL 112* 118* 113* 112*  --  110 108  --  99* 98*  CO2 13*  --  9* 12*  --  17* 16*  --  21* 17*  GLUCOSE 103* 103* 122* 78  --  102* 83  --  89 99  BUN 144* >140* 149* 147*  --  98* 108*  --  79* 109*  CREATININE 11.49* 12.50* 11.62* 11.75*  --  8.46* 9.50*  --  8.06* 11.12*  ALBUMIN 2.8*  --   --  2.7*  --   --   --   --   --  3.0*  CALCIUM 8.3*  --  8.9 8.6*  --  8.2* 8.5*  --  8.7* 8.4*  PHOS  --   --  5.6*  --   --   --   --  5.8*  --  9.8*  AST 22  --   --  22  --   --   --   --   --   --   ALT 21  --   --  21 20  --   --   --   --   --    Liver Function Tests:  Recent Labs Lab 03/29/16 1625 03/30/16 0548 03/30/16 0722 04/04/16 1207  AST 22 22  --   --   ALT 21 21 20   --   ALKPHOS 57 49  --   --   BILITOT 0.3 0.4  --   --   PROT 6.0* 6.2*  --   --   ALBUMIN 2.8* 2.7*  --  3.0*   No results for input(s): LIPASE, AMYLASE in the last 168 hours. No results for input(s): AMMONIA in the last 168  hours. CBC:  Recent Labs Lab 03/29/16 1625  03/30/16 0548 03/31/16 0447 04/01/16 0443 04/04/16 1201  WBC 8.7  --  9.5 7.6 9.5 10.7*  NEUTROABS 7.2  --   --   --   --   --   HGB 9.5*  < > 9.2* 9.3* 10.4* 9.0*  HCT 29.4*  < > 28.4* 27.9* 31.6*  27.2*  MCV 88.0  --  86.9 86.4 86.6 85.0  PLT 247  --  283 287 349 520*  < > = values in this interval not displayed. Cardiac Enzymes: No results for input(s): CKTOTAL, CKMB, CKMBINDEX, TROPONINI in the last 168 hours. CBG: No results for input(s): GLUCAP in the last 168 hours.  Iron Studies: No results for input(s): IRON, TIBC, TRANSFERRIN, FERRITIN in the last 72 hours. Studies/Results: No results found. Derrill Memo ON 04/06/2016]  ceFAZolin (ANCEF) IV  1 g Intravenous To OR  . darbepoetin (ARANESP) injection - DIALYSIS  150 mcg Intravenous Q Tue-HD  . ferric gluconate (FERRLECIT/NULECIT) IV  125 mg Intravenous Q T,Th,Sa-HD  . heparin  5,000 Units Subcutaneous Q8H  . multivitamin  1 tablet Oral QHS    BMET    Component Value Date/Time   NA 131 (L) 04/04/2016 1207   K 5.2 (H) 04/04/2016 1207   CL 98 (L) 04/04/2016 1207   CO2 17 (L) 04/04/2016 1207   GLUCOSE 99 04/04/2016 1207   BUN 109 (H) 04/04/2016 1207   CREATININE 11.12 (H) 04/04/2016 1207   CALCIUM 8.4 (L) 04/04/2016 1207   GFRNONAA 4 (L) 04/04/2016 1207   GFRAA 5 (L) 04/04/2016 1207   CBC    Component Value Date/Time   WBC 10.7 (H) 04/04/2016 1201   RBC 3.20 (L) 04/04/2016 1201   HGB 9.0 (L) 04/04/2016 1201   HCT 27.2 (L) 04/04/2016 1201   PLT 520 (H) 04/04/2016 1201   MCV 85.0 04/04/2016 1201   MCH 28.1 04/04/2016 1201   MCHC 33.1 04/04/2016 1201   RDW 14.5 04/04/2016 1201   LYMPHSABS 1.0 03/29/2016 1625   MONOABS 0.2 03/29/2016 1625   EOSABS 0.2 03/29/2016 1625   BASOSABS 0.0 03/29/2016 1625    Assessment/Plan:  1. Vascular access- for [facility] and AVG on 04/06/16 2. ESRD cont with HD qTTS for now 3. Anemia:on ESA 4. CKD-MBD: follow ca/phos/iPTH 5. Nutrition:  renal  6. Hypertension: stable 7. Seizures- sound more likely to be related to hypotension following HD.  BP meds on hold and no issues thus far  8. Disposition- unclear what the plan will be and likely will need medical asylum for management of his ESRD. SW will need to assist with planning. Donetta Potts, MD [employer] [phone]

## 2016-04-06 ENCOUNTER — Inpatient Hospital Stay (HOSPITAL_COMMUNITY): Payer: Medicaid Other

## 2016-04-06 ENCOUNTER — Encounter (HOSPITAL_COMMUNITY): Payer: Self-pay | Admitting: Anesthesiology

## 2016-04-06 ENCOUNTER — Inpatient Hospital Stay (HOSPITAL_COMMUNITY): Payer: Medicaid Other | Admitting: Anesthesiology

## 2016-04-06 ENCOUNTER — Encounter (HOSPITAL_COMMUNITY)
Admission: EM | Disposition: A | Discharge status: Home / Self Care | Payer: Self-pay | Source: Home / Self Care | Attending: Internal Medicine

## 2016-04-06 HISTORY — PX: INSERTION OF DIALYSIS CATHETER: SHX1324

## 2016-04-06 HISTORY — PX: BASCILIC VEIN TRANSPOSITION: SHX5742

## 2016-04-06 LAB — RENAL FUNCTION PANEL
Albumin: 3.2 g/dL — ABNORMAL LOW (ref 3.5–5.0)
Albumin: 3.1 g/dL — ABNORMAL LOW (ref 3.5–5.0)
Anion gap: 16 — ABNORMAL HIGH (ref 5–15)
Anion gap: 16 — ABNORMAL HIGH (ref 5–15)
BUN: 84 mg/dL — ABNORMAL HIGH (ref 6–20)
BUN: 86 mg/dL — ABNORMAL HIGH (ref 6–20)
CO2: 20 mmol/L — ABNORMAL LOW (ref 22–32)
CO2: 19 mmol/L — ABNORMAL LOW (ref 22–32)
Calcium: 9 mg/dL (ref 8.9–10.3)
Calcium: 8.7 mg/dL — ABNORMAL LOW (ref 8.9–10.3)
Chloride: 97 mmol/L — ABNORMAL LOW (ref 101–111)
Chloride: 100 mmol/L — ABNORMAL LOW (ref 101–111)
Creatinine, Ser: 10.14 mg/dL — ABNORMAL HIGH (ref 0.61–1.24)
Creatinine, Ser: 10.36 mg/dL — ABNORMAL HIGH (ref 0.61–1.24)
GFR calc Af Amer: 6 mL/min — ABNORMAL LOW
GFR calc Af Amer: 6 mL/min — ABNORMAL LOW (ref >60)
GFR calc non Af Amer: 5 mL/min — ABNORMAL LOW
GFR calc non Af Amer: 5 mL/min — ABNORMAL LOW (ref >60)
Glucose, Bld: 92 mg/dL (ref 65–99)
Glucose, Bld: 95 mg/dL (ref 65–99)
Phosphorus: 8.2 mg/dL — ABNORMAL HIGH (ref 2.5–4.6)
Phosphorus: 9.1 mg/dL — ABNORMAL HIGH (ref 2.5–4.6)
Potassium: 5.5 mmol/L — ABNORMAL HIGH (ref 3.5–5.1)
Potassium: 5.1 mmol/L (ref 3.5–5.1)
Sodium: 133 mmol/L — ABNORMAL LOW (ref 135–145)
Sodium: 135 mmol/L (ref 135–145)

## 2016-04-06 LAB — PROTIME-INR
INR: 1.03
Prothrombin Time: 13.5 s (ref 11.4–15.2)

## 2016-04-06 LAB — CBC
HCT: 28 % — ABNORMAL LOW (ref 39.0–52.0)
HCT: 27.4 % — ABNORMAL LOW (ref 39.0–52.0)
Hemoglobin: 8.9 g/dL — ABNORMAL LOW (ref 13.0–17.0)
Hemoglobin: 9 g/dL — ABNORMAL LOW (ref 13.0–17.0)
MCH: 27.8 pg (ref 26.0–34.0)
MCH: 29 pg (ref 26.0–34.0)
MCHC: 31.8 g/dL (ref 30.0–36.0)
MCHC: 32.8 g/dL (ref 30.0–36.0)
MCV: 87.5 fL (ref 78.0–100.0)
MCV: 88.4 fL (ref 78.0–100.0)
Platelets: 526 K/uL — ABNORMAL HIGH (ref 150–400)
Platelets: 515 10*3/uL — ABNORMAL HIGH (ref 150–400)
RBC: 3.2 MIL/uL — ABNORMAL LOW (ref 4.22–5.81)
RBC: 3.1 MIL/uL — ABNORMAL LOW (ref 4.22–5.81)
RDW: 14.8 % (ref 11.5–15.5)
RDW: 14.9 % (ref 11.5–15.5)
WBC: 11.3 K/uL — ABNORMAL HIGH (ref 4.0–10.5)
WBC: 14.4 10*3/uL — ABNORMAL HIGH (ref 4.0–10.5)

## 2016-04-06 LAB — BASIC METABOLIC PANEL WITH GFR
Anion gap: 16 — ABNORMAL HIGH (ref 5–15)
BUN: 87 mg/dL — ABNORMAL HIGH (ref 6–20)
CO2: 22 mmol/L (ref 22–32)
Calcium: 9.3 mg/dL (ref 8.9–10.3)
Chloride: 96 mmol/L — ABNORMAL LOW (ref 101–111)
Creatinine, Ser: 9.58 mg/dL — ABNORMAL HIGH (ref 0.61–1.24)
GFR calc Af Amer: 6 mL/min — ABNORMAL LOW
GFR calc non Af Amer: 5 mL/min — ABNORMAL LOW
Glucose, Bld: 92 mg/dL (ref 65–99)
Potassium: 5.2 mmol/L — ABNORMAL HIGH (ref 3.5–5.1)
Sodium: 134 mmol/L — ABNORMAL LOW (ref 135–145)

## 2016-04-06 SURGERY — INSERTION OF DIALYSIS CATHETER
Anesthesia: Monitor Anesthesia Care | Site: Chest | Laterality: Right

## 2016-04-06 MED ORDER — FENTANYL CITRATE (PF) 100 MCG/2ML IJ SOLN
INTRAMUSCULAR | Status: AC
Start: 2016-04-06 — End: 2016-04-06
  Filled 2016-04-06: qty 2

## 2016-04-06 MED ORDER — PHENYLEPHRINE HCL 10 MG/ML IJ SOLN
INTRAMUSCULAR | Status: DC | PRN
  Administered 2016-04-06: 80 ug via INTRAVENOUS
  Administered 2016-04-06: 40 ug via INTRAVENOUS
  Administered 2016-04-06: 80 ug via INTRAVENOUS

## 2016-04-06 MED ORDER — LIDOCAINE HCL (CARDIAC) 20 MG/ML IV SOLN
INTRAVENOUS | Status: DC | PRN
  Administered 2016-04-06: 60 mg via INTRAVENOUS

## 2016-04-06 MED ORDER — SODIUM CHLORIDE 0.9 % IV SOLN
INTRAVENOUS | Status: DC
  Administered 2016-04-06: 10 mL/h via INTRAVENOUS
  Administered 2016-04-06: 14:00:00 via INTRAVENOUS

## 2016-04-06 MED ORDER — PROPOFOL 10 MG/ML IV BOLUS
INTRAVENOUS | Status: AC
  Filled 2016-04-06: qty 20

## 2016-04-06 MED ORDER — EPHEDRINE SULFATE 50 MG/ML IJ SOLN
INTRAMUSCULAR | Status: DC | PRN
  Administered 2016-04-06: 10 mg via INTRAVENOUS
  Administered 2016-04-06 (×3): 5 mg via INTRAVENOUS

## 2016-04-06 MED ORDER — OXYCODONE-ACETAMINOPHEN 5-325 MG PO TABS
1.0000 | ORAL_TABLET | Freq: Four times a day (QID) | ORAL | Status: DC | PRN
  Administered 2016-04-07: 1 via ORAL
  Filled 2016-04-06: qty 1

## 2016-04-06 MED ORDER — MIDAZOLAM HCL 2 MG/2ML IJ SOLN
INTRAMUSCULAR | Status: AC
  Filled 2016-04-06: qty 2

## 2016-04-06 MED ORDER — FENTANYL CITRATE (PF) 100 MCG/2ML IJ SOLN
INTRAMUSCULAR | Status: DC | PRN
  Administered 2016-04-06: 25 ug via INTRAVENOUS
  Administered 2016-04-06: 50 ug via INTRAVENOUS
  Administered 2016-04-06 (×2): 25 ug via INTRAVENOUS
  Administered 2016-04-06: 50 ug via INTRAVENOUS
  Administered 2016-04-06: 25 ug via INTRAVENOUS

## 2016-04-06 MED ORDER — HEPARIN SODIUM (PORCINE) 1000 UNIT/ML IJ SOLN
INTRAMUSCULAR | Status: DC | PRN
  Administered 2016-04-06: 3000 [IU]

## 2016-04-06 MED ORDER — HEPARIN SODIUM (PORCINE) 1000 UNIT/ML IJ SOLN
INTRAMUSCULAR | Status: AC
  Filled 2016-04-06: qty 1

## 2016-04-06 MED ORDER — SODIUM CHLORIDE 0.9 % IV SOLN
INTRAVENOUS | Status: DC | PRN
  Administered 2016-04-06: 13:00:00

## 2016-04-06 MED ORDER — PROPOFOL 10 MG/ML IV BOLUS
INTRAVENOUS | Status: DC | PRN
  Administered 2016-04-06: 10 mg via INTRAVENOUS
  Administered 2016-04-06: 200 mg via INTRAVENOUS
  Administered 2016-04-06: 20 mg via INTRAVENOUS

## 2016-04-06 MED ORDER — ONDANSETRON HCL 4 MG/2ML IJ SOLN
INTRAMUSCULAR | Status: DC | PRN
  Administered 2016-04-06: 4 mg via INTRAVENOUS

## 2016-04-06 MED ORDER — FENTANYL CITRATE (PF) 100 MCG/2ML IJ SOLN: 25.0000 ug | INTRAMUSCULAR | Status: DC | PRN

## 2016-04-06 MED ORDER — LIDOCAINE HCL (PF) 1 % IJ SOLN
INTRAMUSCULAR | Status: AC
  Filled 2016-04-06: qty 30

## 2016-04-06 MED ORDER — 0.9 % SODIUM CHLORIDE (POUR BTL) OPTIME
TOPICAL | Status: DC | PRN
Start: 2016-04-06 — End: 2016-04-06
  Administered 2016-04-06: 1000 mL

## 2016-04-06 MED ORDER — HEPARIN SODIUM (PORCINE) 1000 UNIT/ML DIALYSIS: 20.0000 [IU]/kg | INTRAMUSCULAR | Status: DC | PRN

## 2016-04-06 MED ORDER — FENTANYL CITRATE (PF) 100 MCG/2ML IJ SOLN
INTRAMUSCULAR | Status: AC
  Filled 2016-04-06: qty 2

## 2016-04-06 MED ORDER — SODIUM POLYSTYRENE SULFONATE 15 GM/60ML PO SUSP
15.0000 g | Freq: Once | ORAL | Status: AC
  Administered 2016-04-06: 15 g via ORAL
  Filled 2016-04-06: qty 60

## 2016-04-06 MED ORDER — PROMETHAZINE HCL 25 MG/ML IJ SOLN: 6.2500 mg | INTRAMUSCULAR | Status: DC | PRN

## 2016-04-06 SURGICAL SUPPLY — 52 items
ARMBAND PINK RESTRICT EXTREMIT (MISCELLANEOUS) ×4 IMPLANT
BAG DECANTER FOR FLEXI CONT (MISCELLANEOUS) ×4 IMPLANT
BIOPATCH RED 1 DISK 7.0 (GAUZE/BANDAGES/DRESSINGS) ×4 IMPLANT
CANISTER SUCTION 2500CC (MISCELLANEOUS) ×4 IMPLANT
CATH PALINDROME RT-P 15FX19CM (CATHETERS) ×4 IMPLANT
CATH PALINDROME RT-P 15FX23CM (CATHETERS) IMPLANT
CATH PALINDROME RT-P 15FX28CM (CATHETERS) IMPLANT
CATH PALINDROME RT-P 15FX55CM (CATHETERS) IMPLANT
CLIP TI MEDIUM 24 (CLIP) ×4 IMPLANT
CLIP TI MEDIUM 6 (CLIP) ×4 IMPLANT
CLIP TI WIDE RED SMALL 24 (CLIP) ×4 IMPLANT
CLIP TI WIDE RED SMALL 6 (CLIP) ×8 IMPLANT
COVER PROBE W GEL 5X96 (DRAPES) ×4 IMPLANT
COVER SURGICAL LIGHT HANDLE (MISCELLANEOUS) ×4 IMPLANT
DERMABOND ADVANCED (GAUZE/BANDAGES/DRESSINGS) ×1
DERMABOND ADVANCED .7 DNX12 (GAUZE/BANDAGES/DRESSINGS) ×3 IMPLANT
DRAPE C-ARM 42X72 X-RAY (DRAPES) ×8 IMPLANT
DRAPE CHEST BREAST 15X10 FENES (DRAPES) ×4 IMPLANT
DRSG COVADERM 4X6 (GAUZE/BANDAGES/DRESSINGS) ×4 IMPLANT
ELECT REM PT RETURN 9FT ADLT (ELECTROSURGICAL) ×4
ELECTRODE REM PT RTRN 9FT ADLT (ELECTROSURGICAL) ×3 IMPLANT
GAUZE SPONGE 2X2 8PLY STRL LF (GAUZE/BANDAGES/DRESSINGS) IMPLANT
GAUZE SPONGE 4X4 16PLY XRAY LF (GAUZE/BANDAGES/DRESSINGS) ×4 IMPLANT
GLOVE BIO SURGEON STRL SZ7.5 (GLOVE) ×4 IMPLANT
GOWN STRL REUS W/ TWL LRG LVL3 (GOWN DISPOSABLE) ×6 IMPLANT
GOWN STRL REUS W/ TWL XL LVL3 (GOWN DISPOSABLE) ×3 IMPLANT
GOWN STRL REUS W/TWL LRG LVL3 (GOWN DISPOSABLE) ×2
GOWN STRL REUS W/TWL XL LVL3 (GOWN DISPOSABLE) ×1
KIT BASIN OR (CUSTOM PROCEDURE TRAY) ×4 IMPLANT
KIT ROOM TURNOVER OR (KITS) ×4 IMPLANT
NEEDLE 18GX1X1/2 (RX/OR ONLY) (NEEDLE) ×4 IMPLANT
NEEDLE HYPO 25GX1X1/2 BEV (NEEDLE) ×4 IMPLANT
NS IRRIG 1000ML POUR BTL (IV SOLUTION) ×4 IMPLANT
PACK CV ACCESS (CUSTOM PROCEDURE TRAY) ×4 IMPLANT
PACK SURGICAL SETUP 50X90 (CUSTOM PROCEDURE TRAY) ×4 IMPLANT
PAD ARMBOARD 7.5X6 YLW CONV (MISCELLANEOUS) ×8 IMPLANT
SOAP 2 % CHG 4 OZ (WOUND CARE) ×4 IMPLANT
SPONGE GAUZE 2X2 STER 10/PKG (GAUZE/BANDAGES/DRESSINGS)
SUT ETHILON 3 0 PS 1 (SUTURE) ×4 IMPLANT
SUT MNCRL AB 4-0 PS2 18 (SUTURE) ×8 IMPLANT
SUT PROLENE 6 0 BV (SUTURE) ×4 IMPLANT
SUT SILK 2 0 SH (SUTURE) ×4 IMPLANT
SUT VIC AB 3-0 SH 27 (SUTURE) ×2
SUT VIC AB 3-0 SH 27X BRD (SUTURE) ×6 IMPLANT
SYR 20CC LL (SYRINGE) ×8 IMPLANT
SYR 5ML LL (SYRINGE) ×4 IMPLANT
SYR CONTROL 10ML LL (SYRINGE) ×4 IMPLANT
SYRINGE 10CC LL (SYRINGE) ×4 IMPLANT
TOWEL OR 17X24 6PK STRL BLUE (TOWEL DISPOSABLE) ×4 IMPLANT
TOWEL OR 17X26 4PK STRL BLUE (TOWEL DISPOSABLE) ×4 IMPLANT
UNDERPAD 30X30 (UNDERPADS AND DIAPERS) ×4 IMPLANT
WATER STERILE IRR 1000ML POUR (IV SOLUTION) ×4 IMPLANT

## 2016-04-06 NOTE — Progress Notes (Signed)
Patient ID: Talib Headley, male   DOB: 1960/07/23, 55 y.o.   MRN: [id number] S:Feels better O:BP (!) 141/82 (BP Location: Right Arm)   Pulse 74   Temp 98.9 F (37.2 C) (Oral)   Resp 18   Ht 5\' 5"  (1.651 m)   Wt 85.4 kg (188 lb 3.2 oz)   SpO2 99%   BMI 31.32 kg/m   Intake/Output Summary (Last 24 hours) at 04/06/16 0844 Last data filed at 04/06/16 0700  Gross per 24 hour  Intake              700 ml  Output              600 ml  Net              100 ml   Intake/Output: I/O last 3 completed shifts: In: 7858 [P.O.:1060] Out: 1350 [Urine:1350]  Intake/Output this shift:  No intake/output data recorded. Weight change: 0.367 kg (12.9 oz) Gen:NAD CVS:no rub Resp:cta IFO:YDXAJO Ext:no edema   Recent Labs Lab 03/30/16 1316 03/31/16 0447 03/31/16 0730 04/01/16 0443 04/04/16 1207 04/06/16 0323  NA 138 138  --  135 131* 134*  K 4.7 5.4*  --  4.5 5.2* 5.2*  CL 110 108  --  99* 98* 96*  CO2 17* 16*  --  21* 17* 22  GLUCOSE 102* 83  --  89 99 92  BUN 98* 108*  --  79* 109* 87*  CREATININE 8.46* 9.50*  --  8.06* 11.12* 9.58*  ALBUMIN  --   --   --   --  3.0*  --   CALCIUM 8.2* 8.5*  --  8.7* 8.4* 9.3  PHOS  --   --  5.8*  --  9.8*  --    Liver Function Tests:  Recent Labs Lab 04/04/16 1207  ALBUMIN 3.0*   No results for input(s): LIPASE, AMYLASE in the last 168 hours. No results for input(s): AMMONIA in the last 168 hours. CBC:  Recent Labs Lab 03/31/16 0447 04/01/16 0443 04/04/16 1201 04/06/16 0323  WBC 7.6 9.5 10.7* 11.3*  HGB 9.3* 10.4* 9.0* 8.9*  HCT 27.9* 31.6* 27.2* 28.0*  MCV 86.4 86.6 85.0 87.5  PLT 287 349 520* 526*   Cardiac Enzymes: No results for input(s): CKTOTAL, CKMB, CKMBINDEX, TROPONINI in the last 168 hours. CBG: No results for input(s): GLUCAP in the last 168 hours.  Iron Studies: No results for input(s): IRON, TIBC, TRANSFERRIN, FERRITIN in the last 72 hours. Studies/Results: No results found. Marland Kitchen  ceFAZolin (ANCEF) IV  1 g Intravenous  To OR  . darbepoetin (ARANESP) injection - DIALYSIS  150 mcg Intravenous Q Tue-HD  . ferric gluconate (FERRLECIT/NULECIT) IV  125 mg Intravenous Q T,Th,Sa-HD  . heparin  5,000 Units Subcutaneous Q8H  . multivitamin  1 tablet Oral QHS    BMET    Component Value Date/Time   NA 134 (L) 04/06/2016 0323   K 5.2 (H) 04/06/2016 0323   CL 96 (L) 04/06/2016 0323   CO2 22 04/06/2016 0323   GLUCOSE 92 04/06/2016 0323   BUN 87 (H) 04/06/2016 0323   CREATININE 9.58 (H) 04/06/2016 0323   CALCIUM 9.3 04/06/2016 0323   GFRNONAA 5 (L) 04/06/2016 0323   GFRAA 6 (L) 04/06/2016 0323   CBC    Component Value Date/Time   WBC 11.3 (H) 04/06/2016 0323   RBC 3.20 (L) 04/06/2016 0323   HGB 8.9 (L) 04/06/2016 0323   HCT 28.0 (L) 04/06/2016 8786  PLT 526 (H) 04/06/2016 0323   MCV 87.5 04/06/2016 0323   MCH 27.8 04/06/2016 0323   MCHC 31.8 04/06/2016 0323   RDW 14.8 04/06/2016 0323   LYMPHSABS 1.0 03/29/2016 1625   MONOABS 0.2 03/29/2016 1625   EOSABS 0.2 03/29/2016 1625   BASOSABS 0.0 03/29/2016 1625     Assessment/Plan:  1. ESRD presumably due to HTN although not certain of exact etiology as he is visiting from [place] and had received 2 HD treatments there but recovered enough renal function to stop HD.  He is now HD-dependent and has been doing well with IHD.  Unclear where he will be staying as he cannot go back to [place] due to lack of medical equipment for ongoing HD.   1. For vascular procedures today so will hold off HD until tomorrow per his request. 2. Vascular access- for tunneled HD catheter and AV access today 3. Anemia of CKD- on Aranesp 4. HTN-stable 5. SHPTH- will need to start binders and check iPTH 6. Disposition- will need SW/CM assistance as he is on tourist visa and will require life-sustaining medical therapy (hemodialysis) that he cannot receive in his native country.   Donetta Potts, MD [employer] [phone]

## 2016-04-06 NOTE — Progress Notes (Signed)
PROGRESS NOTE [facility]   [place]   NFA:[id number] DOB: 10-25-1960  DOA: 03/29/2016 PCP: No primary care provider on file.   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 Patient was in his usual health state and on 11/29, was feeling sick, sore throat, febrile, weak and lethargic. Patient came to the ED where was found to be hyperkalemic K >7.5 and BUN/Cr of 114 and 12.5. Patient had a HD cath placed and was emergently dialyzed, Now patient on ESRD needs long term HD  PTA in [place] patient had an acute illness where he required dialysis for 2 days, and subsequently kidney function recovered to a baseline Cr of 1.0   Subjective: Patient seen and examined at bedside. Patient have no new complaints. Anxious for procedure today. Patient going for AVF placement. K elevated today. Patient going for HD after vascular procedure.   Assessment & Plan: Progressive CKD V - now ESRD  Hyperkalemia and uremia - slowly improving with HD  Undergoing on HD TTS  Had a near syncope episode on 12/2 which resolved with IVF  CLIP process ongoing given patient is here with tourist visa, logistics are complicated  Patient may need medical asylum  CM and CSW appreciated  For AVF placement today and HD after  Unknown disposition at this point   Hyperaklemia  Will give 1 dose of Kayexalate  Will have HD today  Check EKG   HTN - stable Hypertensive medications on hold due to low BP's earlier  Monitor BP   Anemia of chronic disease 2/2 to CKD  Need for transfusion at this time  Iron def as well - Fe supplement per Renal  Aranesp with HD    DVT prophylaxis: Heparin Sq Code Status: Full Family Communication: Family at bedside - daughter and wife  Disposition Plan: Unknown yet   Consultants:   Nephrology   Procedures:   HD - TTS   Antimicrobials:  None     Objective: Vitals:   04/04/16 2105 04/05/16 0519 04/05/16 2333 04/06/16 0612  BP: (!) 152/88 (!) 158/93 (!) 142/96 (!) 141/82  Pulse: 73 63 93 74  Resp: 16 16 18 18   Temp: 98.5 F (36.9 C) 98.3 F (36.8 C) 99 F (37.2 C) 98.9 F (37.2 C)  TempSrc: Oral Oral Oral Oral  SpO2: 99% 100% 98% 99%  Weight: 84.6 kg (186 lb 8 oz)  85.4 kg (188 lb 3.2 oz)   Height: 5\' 5"  (1.651 m)       Intake/Output Summary (Last 24 hours) at 04/06/16 1011 Last data filed at 04/06/16 0700  Gross per 24 hour  Intake              700 ml  Output              600 ml  Net              100 ml   Filed Weights   04/04/16 1453 04/04/16 2105 04/05/16 2333  Weight: 82.5 kg (181 lb 14.1 oz) 84.6 kg (186 lb 8 oz) 85.4 kg (188 lb 3.2 oz)    Examination:  General exam: Appears calm and comfortable  HEENT: R IJ in place clean and dry  Respiratory system: Clear to auscultation. No wheezes,crackle or rhonchi Cardiovascular system: S1 & S2 heard, RRR. No JVD, murmurs, rubs or gallops. Central nervous system: Alert and oriented. Extremities: No pedal edema.  Skin: No rashes, lesions or ulcers   Data Reviewed: I have personally reviewed following labs and imaging studies  CBC:  Recent Labs Lab 03/31/16 0447 04/01/16 0443 04/04/16 1201 04/06/16 0323  WBC 7.6 9.5 10.7* 11.3*  HGB 9.3* 10.4* 9.0* 8.9*  HCT 27.9* 31.6* 27.2* 28.0*  MCV 86.4 86.6 85.0 87.5  PLT 287 349 520* 335*   Basic Metabolic Panel:  Recent Labs Lab 03/30/16 1316 03/31/16 0447 03/31/16 0730 04/01/16 0443 04/04/16 1207 04/06/16 0323  NA 138 138  --  135 131* 134*  K 4.7 5.4*  --  4.5 5.2* 5.2*  CL 110 108  --  99* 98* 96*  CO2 17* 16*  --  21* 17* 22  GLUCOSE 102* 83  --  89 99 92  BUN 98* 108*  --  79* 109* 87*  CREATININE 8.46* 9.50*  --  8.06* 11.12* 9.58*  CALCIUM 8.2* 8.5*  --  8.7* 8.4* 9.3  PHOS  --   --  5.8*  --  9.8*  --    GFR: Estimated Creatinine Clearance: 8.7 mL/min (by C-G formula based on SCr of  9.58 mg/dL (H)). Liver Function Tests:  Recent Labs Lab 04/04/16 1207  ALBUMIN 3.0*   No results for input(s): LIPASE, AMYLASE in the last 168 hours. No results for input(s): AMMONIA in the last 168 hours. Coagulation Profile:  Recent Labs Lab 04/06/16 0323  INR 1.03   Cardiac Enzymes: No results for input(s): CKTOTAL, CKMB, CKMBINDEX, TROPONINI in the last 168 hours. BNP (last 3 results) No results for input(s): PROBNP in the last 8760 hours. HbA1C: No results for input(s): HGBA1C in the last 72 hours. CBG: No results for input(s): GLUCAP in the last 168 hours. Lipid Profile: No results for input(s): CHOL, HDL, LDLCALC, TRIG, CHOLHDL, LDLDIRECT in the last 72 hours. Thyroid Function Tests: No results for input(s): TSH, T4TOTAL, FREET4, T3FREE, THYROIDAB in the last 72 hours. Anemia Panel: No results for input(s): VITAMINB12, FOLATE, FERRITIN, TIBC, IRON, RETICCTPCT in the last 72 hours. Sepsis Labs: No results for input(s): PROCALCITON, LATICACIDVEN in the last 168 hours.  Recent Results (from the past 240 hour(s))  Rapid strep screen (not at [facility])     Status: None   Collection Time: 03/30/16  4:13 AM  Result Value Ref Range Status   Streptococcus, Group A Screen (Direct) NEGATIVE NEGATIVE Final    Comment: (NOTE) A Rapid Antigen test may result negative if the antigen level in the sample is below the detection level of this test. The FDA has not cleared this test as a stand-alone test therefore the rapid antigen negative result has reflexed to a Group A Strep culture.   Culture, group A strep     Status: None   Collection Time: 03/30/16  4:13 AM  Result Value Ref Range Status   Specimen Description THROAT  Final   Special Requests NONE Reflexed from [id number]  Final   Culture NO GROUP A STREP (S.PYOGENES) ISOLATED  Final   Report Status 04/01/2016 FINAL  Final  MRSA PCR Screening     Status: None   Collection Time: 03/30/16  4:30 AM  Result Value Ref Range Status    MRSA by PCR NEGATIVE NEGATIVE Final    Comment:        The GeneXpert MRSA Assay (FDA approved for NASAL specimens only), is one component of a comprehensive MRSA colonization surveillance program. It is not intended to diagnose MRSA infection nor to guide or monitor treatment for  MRSA infections.   Surgical pcr screen     Status: None   Collection Time: 04/05/16 10:36 AM  Result Value Ref Range Status   MRSA, PCR NEGATIVE NEGATIVE Final   Staphylococcus aureus NEGATIVE NEGATIVE Final    Comment:        The Xpert SA Assay (FDA approved for NASAL specimens in patients over 48 years of age), is one component of a comprehensive surveillance program.  Test performance has been validated by [facility] for patients greater than or equal to 64 year old. It is not intended to diagnose infection nor to guide or monitor treatment.      Radiology Studies: No results found.   Scheduled Meds: .  ceFAZolin (ANCEF) IV  1 g Intravenous To OR  . darbepoetin (ARANESP) injection - DIALYSIS  150 mcg Intravenous Q Tue-HD  . ferric gluconate (FERRLECIT/NULECIT) IV  125 mg Intravenous Q T,Th,Sa-HD  . heparin  5,000 Units Subcutaneous Q8H  . multivitamin  1 tablet Oral QHS   Continuous Infusions:   LOS: 8 days    Chipper Oman, MD [facility] Pager [phone]  If 7PM-7AM, please contact night-coverage [email or web address] Password [email or web address] 04/06/2016, 10:11 AM

## 2016-04-06 NOTE — Anesthesia Procedure Notes (Deleted)
Performed by: Aynslee Mulhall E     

## 2016-04-06 NOTE — Op Note (Signed)
    Patient name: Loye Vento MRN: [id number] DOB: 22-Dec-1960 Sex: male  04/06/2016 Pre-operative Diagnosis: esrd Post-operative diagnosis:  Same Surgeon:  Erlene Quan C. Donzetta Matters, MD Procedure Performed: 1.  Right internal jugular tunneled dialysis catheter 2.  Left arm single stage basilic vein transposition arteriovenous fistula  Indications:  55 year old male recent diagnosis end-stage renal disease now on dialysis in need of permanent access.  Findings: Internal jugular vein was patent and suitable for placement of tunnelled catheter. At completion catheter terminated in the superior vena cava. Left arm basilic vein was harvested from antecubitum to axilla was suitable size for preop/postop fistula creation in a completion was a palpable thrill  and radial artery signal and augmented with compression of fistula.    Procedure:  The patient was identified in the holding area and taken to The operating room where he was placed supine on the operating table general anesthesia was induced and his right neck and chest was sterilely prepped and draped in usual fashion. Timeout was called we began with ultrasound-guided evaluation of the right internal jugular vein which was noted to be patent and compressible. This was cannulated with 18-gauge needle and wire passed under fluoroscopy into the IVC. A counterincision was made and a 19 cm catheter tunneled between the 2 incisions. The internal jugular tract was then serially dilated and introducer was placed. The 19 cm catheter was then placed under introducer catheter was clamped trimmed to size and pieces assembled. Did flush easily and was instilled with heparin and at [facility] recommendation. At completion fluoroscopy was noted to be in superior vena cava with smooth curve. Dermabond was placed at the incisions sterile dressing applied. Dressings were taken down his left arm was then sterilely prepped and draped in usual fashion and another timeout  called. We verified the basilic vein under ultrasound guidance marked on the skin. An incision was made longitudinally above the antecubitum and basilic vein dissected out and the nerve protected throughout its course. The vein was traced proximally to the level of the antecubitum clamped and divided between ties. Branches were then divided between clips and ties. Separate incision was made in the axilla and the vein harvested up to that level. Flushing maneuvers were then performed and there were noted to be no defects in the vein. We thought we then dissected free our brachial artery above the antecubitum by dividing the fascia placed vessel loop around it. The vein was then tunneled brought in usual fashion maintaining orientation. Flushed again easily and again was reclamped. The artery was then clamped proximally and distally opened longitudinally flushed proximally and distally with heparinized saline. The vein was then sewn end-to-side with 6-0 Prolene suture and a completion anastomosis flushing techniques were performed. Clamps were then released there was noted to be a palpable thrill in the runoff vein dilation and a signal at the radial artery at the wrist that augmented with compression of the vein. Satisfied the wounds were irrigated hemostasis obtained and they were closed with 3-0 Vicryl for Monocryl. Dermabond was placed level skin. Patient Y from anesthesia having tolerated well without immediate, combination.    Servando Kyllonen C. Donzetta Matters, MD [facility] Office: [phone] Pager: [phone]

## 2016-04-06 NOTE — Care Management Note (Addendum)
Case Management Note  Patient Details  Name: Dylan [name] MRN: [id number] Date of Birth: [birth year]  Subjective/Objective:   Progressive CKD V, ESRD-needing outpt HD, from [place] here visiting family                Action/Plan: Discharge Planning: NCM spoke to pt's and Lamar Benes # [phone] at bedside. Pt is non-English speaking. Gave permission to speak to dtr. Dtr states that pt did not have travel insurance on his ticket back to [place]. States she will have to pay for him a return ticket. States that dialysis center in [place] are private pay and you have present money and supplies when going for treatment. They do have emergency dialysis at the hospital but there is a long wait to have service. She plans to go back to [place] to arrange dialysis for the pt. He is here on a 6 month tourist Visa. Spoke to [employer], Development worker, community and a emergency Medicaid application is pending. Provided contact at the [facility], Leanora Ivanoff [phone]. Left voice message for return call. Processing application for emergency Medicaid to assist in outpatient HD could take up to 90 days.    Expected Discharge Date:                  Expected Discharge Plan:  Home/Self Care  In-House Referral:  Clinical Social Work  Discharge planning Services  CM Consult  Post Acute Care Choice:  NA Choice offered to:  NA  DME Arranged:  N/A DME Agency:  NA  HH Arranged:  NA HH Agency:  NA  Status of Service:  In process, will continue to follow  If discussed at Long Length of Stay Meetings, dates discussed:  04/04/2016, 04/06/2016  Additional Comments:  Erenest Rasher, RN 04/06/2016, 2:42 PM

## 2016-04-06 NOTE — Transfer of Care (Signed)
Immediate Anesthesia Transfer of Care Note  Patient: Dylan [name]  Procedure(s) Performed: Procedure(s): INSERTION OF Tunneled DIALYSIS CATHETER RIGHT INTERNAL JUGULAR (Right) Left Arm Single Stage Bascilic Vein Transposition (Left)  Patient Location: PACU  Anesthesia Type:General  Level of Consciousness: awake, oriented, sedated, patient cooperative and responds to stimulation  Airway & Oxygen Therapy: Patient Spontanous Breathing and Patient connected to face mask oxygen  Post-op Assessment: Report given to RN, Post -op Vital signs reviewed and stable, Patient moving all extremities and Patient moving all extremities X 4  Post vital signs: Reviewed and stable  Last Vitals:  Vitals:   04/06/16 0612 04/06/16 1025  BP: (!) 141/82 125/78  Pulse: 74 71  Resp: 18 18  Temp: 37.2 C 36.8 C    Last Pain:  Vitals:   04/06/16 1025  TempSrc: Oral  PainSc:          Complications: No apparent anesthesia complications

## 2016-04-06 NOTE — Progress Notes (Signed)
  Progress Note    04/06/2016 12:25 PM Day of Surgery  Subjective:  No acute issues  Vitals:   04/06/16 0612 04/06/16 1025  BP: (!) 141/82 125/78  Pulse: 74 71  Resp: 18 18  Temp: 98.9 F (37.2 C) 98.2 F (36.8 C)    Physical Exam: [email or web address]  Non labored breathing Palpable left radial pulse  CBC    Component Value Date/Time   WBC 11.3 (H) 04/06/2016 0323   RBC 3.20 (L) 04/06/2016 0323   HGB 8.9 (L) 04/06/2016 0323   HCT 28.0 (L) 04/06/2016 0323   PLT 526 (H) 04/06/2016 0323   MCV 87.5 04/06/2016 0323   MCH 27.8 04/06/2016 0323   MCHC 31.8 04/06/2016 0323   RDW 14.8 04/06/2016 0323   LYMPHSABS 1.0 03/29/2016 1625   MONOABS 0.2 03/29/2016 1625   EOSABS 0.2 03/29/2016 1625   BASOSABS 0.0 03/29/2016 1625    BMET    Component Value Date/Time   NA 133 (L) 04/06/2016 1012   K 5.5 (H) 04/06/2016 1012   CL 97 (L) 04/06/2016 1012   CO2 20 (L) 04/06/2016 1012   GLUCOSE 92 04/06/2016 1012   BUN 84 (H) 04/06/2016 1012   CREATININE 10.14 (H) 04/06/2016 1012   CALCIUM 9.0 04/06/2016 1012   GFRNONAA 5 (L) 04/06/2016 1012   GFRAA 6 (L) 04/06/2016 1012    INR    Component Value Date/Time   INR 1.03 04/06/2016 0323     Intake/Output Summary (Last 24 hours) at 04/06/16 1225 Last data filed at 04/06/16 0700  Gross per 24 hour  Intake              460 ml  Output              600 ml  Net             -140 ml     Assessment:  55 y.o. male is here with esrd, in need of permanent access  Plan: OR today for RIJ tdc and basilic vein transposition  Randie Tallarico C. Donzetta Matters, MD [facility] Office: [phone] Pager: [phone]  04/06/2016 12:25 PM

## 2016-04-06 NOTE — Discharge Instructions (Signed)
° ° °  04/06/2016 Dylan [name] [id number] [birth year]  Surgeon(s): Waynetta Sandy, MD  Procedure(s): INSERTION OF Tunneled DIALYSIS CATHETER RIGHT INTERNAL JUGULAR Left Arm Single Stage Bascilic Vein Transposition  x Do not stick fistula for 12 weeks

## 2016-04-06 NOTE — Anesthesia Preprocedure Evaluation (Addendum)
Anesthesia Evaluation  Patient identified by MRN, date of birth, ID band Patient awake    Reviewed: Allergy & Precautions, NPO status , Patient's Chart, lab work & pertinent test results  Airway Mallampati: II  TM Distance: >3 FB Neck ROM: Full    Dental no notable dental hx.    Pulmonary neg pulmonary ROS,    Pulmonary exam normal breath sounds clear to auscultation       Cardiovascular hypertension, Normal cardiovascular exam Rhythm:Regular Rate:Normal     Neuro/Psych negative neurological ROS  negative psych ROS   GI/Hepatic negative GI ROS, Neg liver ROS,   Endo/Other  negative endocrine ROS  Renal/GU CRFRenal disease  negative genitourinary   Musculoskeletal negative musculoskeletal ROS (+)   Abdominal   Peds negative pediatric ROS (+)  Hematology  (+) anemia ,   Anesthesia Other Findings   Reproductive/Obstetrics negative OB ROS                             Anesthesia Physical Anesthesia Plan  ASA: III  Anesthesia Plan: General   Post-op Pain Management:    Induction: Intravenous  Airway Management Planned: LMA  Additional Equipment:   Intra-op Plan:   Post-operative Plan: Extubation in OR  Informed Consent: I have reviewed the patients History and Physical, chart, labs and discussed the procedure including the risks, benefits and alternatives for the proposed anesthesia with the patient or authorized representative who has indicated his/her understanding and acceptance.   Dental advisory given  Plan Discussed with: CRNA and Surgeon  Anesthesia Plan Comments:        Anesthesia Quick Evaluation

## 2016-04-06 NOTE — Anesthesia Procedure Notes (Addendum)
Procedure Name: LMA Insertion Date/Time: 04/06/2016 1:03 PM Performed by: Ebbie Latus E Pre-anesthesia Checklist: Patient identified, Emergency Drugs available, Suction available and Patient being monitored Patient Re-evaluated:Patient Re-evaluated prior to inductionOxygen Delivery Method: Circle System Utilized Preoxygenation: Pre-oxygenation with 100% oxygen Intubation Type: IV induction Ventilation: Mask ventilation without difficulty LMA: LMA inserted LMA Size: 4.0 Number of attempts: 1 Airway Equipment and Method: Bite block Placement Confirmation: positive ETCO2 Tube secured with: Tape Dental Injury: Teeth and Oropharynx as per pre-operative assessment

## 2016-04-07 ENCOUNTER — Encounter (HOSPITAL_COMMUNITY): Payer: Self-pay | Admitting: Vascular Surgery

## 2016-04-07 LAB — PARATHYROID HORMONE, INTACT (NO CA): PTH: 235 pg/mL — ABNORMAL HIGH (ref 15–65)

## 2016-04-07 NOTE — Plan of Care (Signed)
Problem: Food- and Nutrition-Related Knowledge Deficit (NB-1.1) Goal: Nutrition education Formal process to instruct or train a patient/client in a skill or to impart knowledge to help patients/clients voluntarily manage or modify food choices and eating behavior to maintain or improve health. Outcome: Adequate for Discharge Nutrition Education Note  RD re-consulted for Renal Education. Per RN, pt and family to be discharge home and and have lots of questions regarding diet.   Spoke mainly with pt daughter at bedside. Pt family denied interpreter. Pt daughter and family have reviewed handout thoroughly and had multiple thoughtful questions for this RD.   Provided "[place] With Kidney Disease" (in Spanish) to patient/family. Reviewed food groups and provided written recommended serving sizes specifically determined for patient's current nutritional status.   Explained why diet restrictions are needed and provided lists of foods to limit/avoid that are high potassium, sodium, and phosphorus. Provided specific recommendations on safer alternatives of these foods. Strongly encouraged compliance of this diet.   Discussed importance of protein intake at each meal and snack. Provided examples of how to maximize protein intake throughout the day. Discussed need for fluid restriction with dialysis, importance of minimizing weight gain between HD treatments, and renal-friendly beverage options.  Encouraged pt to discuss specific diet questions/concerns with RD at HD outpatient facility. Teach back method used.  Expect fair to good compliance.  Body mass index is 30.19 kg/m. Pt meets criteria for obesity, class I based on current BMI.  Current diet order is real/carb modified, patient is consuming approximately 100% of meals at this time. Labs and medications reviewed. No further nutrition interventions warranted at this time. RD contact information provided. If additional  nutrition issues arise, please re-consult RD.  Camran Keady A. Jimmye Norman, RD, LDN, CDE Pager: [phone] After hours Pager: [phone]

## 2016-04-07 NOTE — Progress Notes (Signed)
PROGRESS NOTE [facility]   [place]   WJX:[id number] DOB: 10/06/1960  DOA: 03/29/2016 PCP: No primary care provider on file.   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 Patient was in his usual health state and on 11/29, was feeling sick, sore throat, febrile, weak and lethargic. Patient came to the ED where was found to be hyperkalemic K >7.5 and BUN/Cr of 114 and 12.5. Patient had a HD cath placed and was emergently dialyzed, Now patient on ESRD needs long term HD.  PTA in [place] patient had an acute illness where he required dialysis for 2 days, and subsequently kidney function recovered to a baseline Cr of 1.0   Subjective: Patient seen and examined on HD, has some pain in left upper arm AVF site but denies SOB, any questions at this time.   Assessment & Plan: Progressive CKD V - now ESRD  Hyperkalemia and uremia - slowly improving with HD  Had a near syncope episode on 12/2 which resolved with IVF  CLIP process ongoing given patient is here with tourist visa, logistics are complicated  Patient may need medical asylum  CM and CSW appreciated  Had AVF placement 12/7 and HD today 12/8 Unknown disposition at this point   Hyperaklemia  Better, treating on HD today   HTN - stable Hypertensive medications on hold due to low BP's earlier  Monitor BP   Anemia of chronic disease 2/2 to CKD  Need for transfusion at this time  Iron def as well - Fe supplement per Renal  Aranesp with HD   DVT prophylaxis: Heparin Sq Code Status: Full Family Communication: No family at bedside in HD unit this AM. Disposition Plan: Unknown yet   Consultants:   Nephrology   Procedures:   HD - TTS   Antimicrobials:  None    Objective: Vitals:   04/07/16 0717 04/07/16 0730 04/07/16 0800 04/07/16 0830  BP: 138/85 139/84 (!) 146/86 (!) 143/90  Pulse: 88 92  98 (!) 108  Resp:      Temp:      TempSrc:      SpO2:      Weight:      Height:        Intake/Output Summary (Last 24 hours) at 04/07/16 0947 Last data filed at 04/07/16 0719  Gross per 24 hour  Intake              660 ml  Output             1625 ml  Net             -965 ml   Filed Weights   04/04/16 2105 04/05/16 2333 04/07/16 0710  Weight: 84.6 kg (186 lb 8 oz) 85.4 kg (188 lb 3.2 oz) 83.7 kg (184 lb 8.4 oz)    Examination:  General exam: Appears calm and comfortable  HEENT: R IJ in place clean and dry  Respiratory system: Clear to auscultation. No wheezes,crackle or rhonchi Cardiovascular system: S1 & S2 heard, RRR. No JVD, murmurs, rubs or gallops. Central nervous system: Alert and oriented. Extremities: No pedal edema. LUE swelling and well approximated incisions from new AVF Skin: No rashes, lesions or ulcers   Data Reviewed: I have personally reviewed following labs and imaging studies  CBC:  Recent Labs Lab 04/01/16 0443 04/04/16 1201 04/06/16 0323 04/06/16 1629  WBC 9.5 10.7* 11.3* 14.4*  HGB 10.4* 9.0* 8.9* 9.0*  HCT 31.6* 27.2* 28.0* 27.4*  MCV 86.6 85.0 87.5 88.4  PLT 349 520* 526* 836*   Basic Metabolic Panel:  Recent Labs Lab 04/01/16 0443 04/04/16 1207 04/06/16 0323 04/06/16 1012 04/06/16 1629  NA 135 131* 134* 133* 135  K 4.5 5.2* 5.2* 5.5* 5.1  CL 99* 98* 96* 97* 100*  CO2 21* 17* 22 20* 19*  GLUCOSE 89 99 92 92 95  BUN 79* 109* 87* 84* 86*  CREATININE 8.06* 11.12* 9.58* 10.14* 10.36*  CALCIUM 8.7* 8.4* 9.3 9.0 8.7*  PHOS  --  9.8*  --  8.2* 9.1*   GFR: Estimated Creatinine Clearance: 7.9 mL/min (by C-G formula based on SCr of 10.36 mg/dL (H)). Liver Function Tests:  Recent Labs Lab 04/04/16 1207 04/06/16 1012 04/06/16 1629  ALBUMIN 3.0* 3.2* 3.1*   No results for input(s): LIPASE, AMYLASE in the last 168 hours. No results for input(s): AMMONIA in the last 168 hours. Coagulation Profile:  Recent Labs Lab  04/06/16 0323  INR 1.03   Cardiac Enzymes: No results for input(s): CKTOTAL, CKMB, CKMBINDEX, TROPONINI in the last 168 hours. BNP (last 3 results) No results for input(s): PROBNP in the last 8760 hours. HbA1C: No results for input(s): HGBA1C in the last 72 hours. CBG: No results for input(s): GLUCAP in the last 168 hours. Lipid Profile: No results for input(s): CHOL, HDL, LDLCALC, TRIG, CHOLHDL, LDLDIRECT in the last 72 hours. Thyroid Function Tests: No results for input(s): TSH, T4TOTAL, FREET4, T3FREE, THYROIDAB in the last 72 hours. Anemia Panel: No results for input(s): VITAMINB12, FOLATE, FERRITIN, TIBC, IRON, RETICCTPCT in the last 72 hours. Sepsis Labs: No results for input(s): PROCALCITON, LATICACIDVEN in the last 168 hours.  Recent Results (from the past 240 hour(s))  Rapid strep screen (not at [facility])     Status: None   Collection Time: 03/30/16  4:13 AM  Result Value Ref Range Status   Streptococcus, Group A Screen (Direct) NEGATIVE NEGATIVE Final    Comment: (NOTE) A Rapid Antigen test may result negative if the antigen level in the sample is below the detection level of this test. The FDA has not cleared this test as a stand-alone test therefore the rapid antigen negative result has reflexed to a Group A Strep culture.   Culture, group A strep     Status: None   Collection Time: 03/30/16  4:13 AM  Result Value Ref Range Status   Specimen Description THROAT  Final   Special Requests NONE Reflexed from [id number]  Final   Culture NO GROUP A STREP (S.PYOGENES) ISOLATED  Final   Report Status 04/01/2016 FINAL  Final  MRSA PCR Screening     Status: None   Collection Time: 03/30/16  4:30 AM  Result Value Ref Range Status   MRSA by PCR NEGATIVE NEGATIVE Final    Comment:        The GeneXpert MRSA Assay (FDA approved for NASAL specimens only), is one component of a comprehensive MRSA colonization surveillance program. It is not intended to diagnose MRSA infection nor  to guide or monitor treatment for MRSA infections.   Surgical pcr screen     Status: None   Collection Time: 04/05/16 10:36 AM  Result Value Ref Range Status   MRSA, PCR NEGATIVE NEGATIVE Final   Staphylococcus aureus NEGATIVE NEGATIVE Final    Comment:        The Xpert SA Assay (FDA approved for NASAL specimens in patients over 21 years  of age), is one component of a comprehensive surveillance program.  Test performance has been validated by [facility] for patients greater than or equal to 39 year old. It is not intended to diagnose infection nor to guide or monitor treatment.      Radiology Studies: Dg Chest Port 1 View  Result Date: 04/06/2016 CLINICAL DATA:  Status post hemodialysis catheter placement. EXAM: PORTABLE CHEST 1 VIEW COMPARISON:  Radiograph of March 29, 2016. FINDINGS: The heart size and mediastinal contours are within normal limits. Both lungs are clear. No pneumothorax or pleural effusion is noted. Pre-existing right internal jugular catheter is unchanged with distal tip in expected position of the SVC. Interval placement of new dialysis catheter into right internal jugular vein is noted with distal tip in expected position of the SVC. The visualized skeletal structures are unremarkable. IMPRESSION: Interval placement of new dialysis catheter into right internal jugular vein with distal tip in expected position of SVC. No pneumothorax is noted. Electronically Signed   By: Marijo Conception, M.D.   On: 04/06/2016 16:37   Dg Fluoro Guide Cv Line-no Report  Result Date: 04/06/2016 There is no Radiologist interpretation  for this exam.    Scheduled Meds: . darbepoetin (ARANESP) injection - DIALYSIS  150 mcg Intravenous Q Tue-HD  . ferric gluconate (FERRLECIT/NULECIT) IV  125 mg Intravenous Q T,Th,Sa-HD  . heparin  5,000 Units Subcutaneous Q8H  . multivitamin  1 tablet Oral QHS   Continuous Infusions:   LOS: 9 days    Venecia Mehl Marry Guan, MD [facility] Pager [phone]  If 7PM-7AM, please contact night-coverage [email or web address] Password [facility] 04/07/2016, 9:47 AM

## 2016-04-07 NOTE — Progress Notes (Signed)
Patient's daughter informed about the seizure he had while he was in the bathroom.  According to her it didn't last too long.  Went to check the patient looked good talking to wife and daughter.  The seizure was not witnessed by the staff.  Checked the B/P upon family request it was 53/55.  Per family it is too low, pt was put back to bed in trendelenburg position.  Checked again within 30 min 117/66.  Patient was more comfortable was feeling ok per daughter.  Very scant amt of blood (drop) noted on incision (forearm), pts daughter told that she hold him hard on that area while transferring him to bed, told the family to keep monitoring and if anything more need to inform the staff, agreed to do so.  So far no blood noted.  Made aware of the situation to the oncoming nurse, will continue to monitor.

## 2016-04-07 NOTE — Progress Notes (Signed)
04/07/2016 1000   NCM spoke to pt's family friend, Gara Kroner [phone] per pt/dtr request. States pt will be living with him while in [place]. Requested to speak to Director. Lorenso Quarry, 6e Director did speak to pt and family about dc plan. Explained his emergency Medicaid application was pending and could take up to 90 days to process. Pt's friend wanted to know about outpt dialysis and if he could go to [facility]. Had questions about potassium and diet. Director did put in consult for dietician to speak with pt on language line to explain diet restrictions and needs. NCM explained to pt that [facility] can do dialysis at that facility. Explained that his follow up appt post dc may require up front out of pocket cost. The physician's office will let them know what payment is needed.   04/07/2016 1155 Received call back from DSS. Spoke to [employer], [place] DSS. They need additional information, copy of Visa and date when he arrived in [place]. Made daughter aware. They will mail letter out to pt and family.   Jonnie Finner RN CCM Case Mgmt phone [phone]

## 2016-04-07 NOTE — Procedures (Signed)
I was present at this dialysis session. I have reviewed the session itself and made appropriate changes. Overall feels well.   Filed Weights   04/04/16 2105 04/05/16 2333 04/07/16 0710  Weight: 84.6 kg (186 lb 8 oz) 85.4 kg (188 lb 3.2 oz) 83.7 kg (184 lb 8.4 oz)     Recent Labs Lab 04/06/16 1629  NA 135  K 5.1  CL 100*  CO2 19*  GLUCOSE 95  BUN 86*  CREATININE 10.36*  CALCIUM 8.7*  PHOS 9.1*     Recent Labs Lab 04/04/16 1201 04/06/16 0323 04/06/16 1629  WBC 10.7* 11.3* 14.4*  HGB 9.0* 8.9* 9.0*  HCT 27.2* 28.0* 27.4*  MCV 85.0 87.5 88.4  PLT 520* 526* 515*    Scheduled Meds: . darbepoetin (ARANESP) injection - DIALYSIS  150 mcg Intravenous Q Tue-HD  . ferric gluconate (FERRLECIT/NULECIT) IV  125 mg Intravenous Q T,Th,Sa-HD  . heparin  5,000 Units Subcutaneous Q8H  . multivitamin  1 tablet Oral QHS   Continuous Infusions: PRN Meds:.acetaminophen, calcium carbonate, heparin, hydrALAZINE, ondansetron (ZOFRAN) IV, oxyCODONE-acetaminophen, sodium chloride flush    Assessment/Plan:  1. ESRD presumably due to HTN although not certain of exact etiology as he is visiting from [place] and had received 2 HD treatments there but recovered enough renal function to stop HD.  He is now HD-dependent and has been doing well with IHD.  Unclear where he will be staying as he cannot go back to [place] due to lack of medical equipment for ongoing HD.   1. Continue with IHD qMWF for now. 2. Await outpatient unit and schedule.  2. Vascular access- s/p RIJ tunneled HD catheter and single stage L BVAVF placed by Dr. Donzetta Matters on 04/06/16.  +T/B. 3. Anemia of CKD- on Aranesp 4. HTN-stable 5. SHPTH- will need to start binders and check iPTH 6. Disposition- will need SW/CM assistance as he is on tourist visa and will require life-sustaining medical therapy (hemodialysis) that he cannot receive in his native country.   Donetta Potts,  MD 04/07/2016, 9:30 AM

## 2016-04-07 NOTE — Progress Notes (Addendum)
  Postoperative hemodialysis access     Date of Surgery:  04/06/16 Surgeon: Donzetta Matters  Subjective:  Resting comfortably on HD; denies hand pain   PHYSICAL EXAMINATION:  Vitals:   04/07/16 0717 04/07/16 0730  BP: 138/85 139/84  Pulse: 88 92  Resp:    Temp:      Incision is clean and dry Sensation in digits is intact;  There is  Thrill  There is bruit. The graft/fistula is palpable    ASSESSMENT/PLAN:  Dylan [name] is a 55 y.o. year old male who is s/p single stage left BVT and TDC placement.  -graft/fistula is patent -pt does not have evidence of steal sx -f/u with Dr. Donzetta Matters in 4-6 weeks to check maturation of AVF if he is still in the [place].  Office will call to arrange.  -will sign off-call as needed.   Leontine Locket, PA-C [facility] [phone]   I have independently interviewed patient and agree with PA assessment and plan above.   Akasha Melena C. Donzetta Matters, MD [facility] [place] Office: [phone] Pager: [phone]

## 2016-04-08 LAB — CBC WITH DIFFERENTIAL/PLATELET
Basophils Absolute: 0.1 10*3/uL (ref 0.0–0.1)
Basophils Relative: 1 %
Eosinophils Absolute: 0.3 10*3/uL (ref 0.0–0.7)
Eosinophils Relative: 2 %
HCT: 27.8 % — ABNORMAL LOW (ref 39.0–52.0)
Hemoglobin: 8.8 g/dL — ABNORMAL LOW (ref 13.0–17.0)
Lymphocytes Relative: 25 %
Lymphs Abs: 2.8 10*3/uL (ref 0.7–4.0)
MCH: 28.4 pg (ref 26.0–34.0)
MCHC: 31.7 g/dL (ref 30.0–36.0)
MCV: 89.7 fL (ref 78.0–100.0)
Monocytes Absolute: 1.1 10*3/uL — ABNORMAL HIGH (ref 0.1–1.0)
Monocytes Relative: 9 %
Neutro Abs: 7.2 10*3/uL (ref 1.7–7.7)
Neutrophils Relative %: 63 %
Platelets: 532 10*3/uL — ABNORMAL HIGH (ref 150–400)
RBC: 3.1 MIL/uL — ABNORMAL LOW (ref 4.22–5.81)
RDW: 15 % (ref 11.5–15.5)
WBC: 11.4 10*3/uL — ABNORMAL HIGH (ref 4.0–10.5)

## 2016-04-08 LAB — COMPREHENSIVE METABOLIC PANEL
ALT: 7 U/L — ABNORMAL LOW (ref 17–63)
AST: 23 U/L (ref 15–41)
Albumin: 3 g/dL — ABNORMAL LOW (ref 3.5–5.0)
Alkaline Phosphatase: 52 U/L (ref 38–126)
Anion gap: 14 (ref 5–15)
BUN: 46 mg/dL — ABNORMAL HIGH (ref 6–20)
CO2: 25 mmol/L (ref 22–32)
Calcium: 8.9 mg/dL (ref 8.9–10.3)
Chloride: 92 mmol/L — ABNORMAL LOW (ref 101–111)
Creatinine, Ser: 7.83 mg/dL — ABNORMAL HIGH (ref 0.61–1.24)
GFR calc Af Amer: 8 mL/min — ABNORMAL LOW (ref >60)
GFR calc non Af Amer: 7 mL/min — ABNORMAL LOW (ref >60)
Glucose, Bld: 132 mg/dL — ABNORMAL HIGH (ref 65–99)
Potassium: 4.3 mmol/L (ref 3.5–5.1)
Sodium: 131 mmol/L — ABNORMAL LOW (ref 135–145)
Total Bilirubin: 0.4 mg/dL (ref 0.3–1.2)
Total Protein: 6.5 g/dL (ref 6.5–8.1)

## 2016-04-08 LAB — CORTISOL: Cortisol, Plasma: 10.5 ug/dL

## 2016-04-08 LAB — TSH: TSH: 0.402 u[IU]/mL (ref 0.350–4.500)

## 2016-04-08 MED ORDER — SODIUM CHLORIDE 0.9 % IV SOLN
125.0000 mg | INTRAVENOUS | Status: DC
  Administered 2016-04-10: 125 mg via INTRAVENOUS
  Filled 2016-04-08 (×2): qty 10

## 2016-04-08 MED ORDER — METHOCARBAMOL 500 MG PO TABS: 500.0000 mg | ORAL_TABLET | Freq: Three times a day (TID) | ORAL | Status: DC | PRN

## 2016-04-08 NOTE — Progress Notes (Signed)
PROGRESS NOTE [facility]   [place]   LTJ:[id number] DOB: 1960-12-21  DOA: 03/29/2016 PCP: No primary care provider on file.   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 Patient was in his usual health state and on 11/29, was feeling sick, sore throat, febrile, weak and lethargic. Patient came to the ED where was found to be hyperkalemic K >7.5 and BUN/Cr of 114 and 12.5. Patient had a HD cath placed and was emergently dialyzed, Now patient on ESRD needs long term HD.  PTA in [place] patient had an acute illness where he required dialysis for 2 days, and subsequently kidney function recovered to a baseline Cr of 1.0   Subjective: Complains about tremors last night as well as dizziness and lightheadedness this morning.  Assessment & Plan: Progressive CKD V - now ESRD  Hyperkalemia and uremia - slowly improving with HD  Had a near syncope episode on 12/2 which resolved with IVF  CLIP process ongoing given patient is here with tourist visa, logistics are complicated  Patient may need medical asylum  CM and CSW appreciated  Had AVF placement 12/7 and HD today 12/8 Unknown disposition at this point   Hyperaklemia  Better,  HTN - stable Hypertensive medications on hold due to low BP's earlier  Monitor BP   Anemia of chronic disease 2/2 to CKD  Need for transfusion at this time  Iron def as well - Fe supplement per Renal  Aranesp with HD   DVT prophylaxis: Heparin Sq Code Status: Full Family Communication: family at bedside Disposition Plan: Unknown yet   Consultants:   Nephrology   Procedures:   HD - TTS   Antimicrobials:  None    Objective: Vitals:   04/07/16 2153 04/08/16 0606 04/08/16 0852 04/08/16 1656  BP: 126/82 (!) 93/54 109/71   Pulse: 95 96 92   Resp: 19 20 20 20   Temp: 98.5 F (36.9 C) 98.6 F (37 C) 98.1 F (36.7 C)  98.6 F (37 C)  TempSrc: Oral Oral Oral Oral  SpO2: 98% 99% 99% 99%  Weight: 82.4 kg (181 lb 10.5 oz)     Height:        Intake/Output Summary (Last 24 hours) at 04/08/16 1753 Last data filed at 04/08/16 1450  Gross per 24 hour  Intake              600 ml  Output                0 ml  Net              600 ml   Filed Weights   04/07/16 0710 04/07/16 1047 04/07/16 2153  Weight: 83.7 kg (184 lb 8.4 oz) 82.3 kg (181 lb 7 oz) 82.4 kg (181 lb 10.5 oz)    Examination:  General exam: Appears calm and comfortable  HEENT: R IJ in place clean and dry  Respiratory system: Clear to auscultation. No wheezes,crackle or rhonchi Cardiovascular system: S1 & S2 heard, RRR. No JVD, murmurs, rubs or gallops. Central nervous system: Alert and oriented. Extremities: No pedal edema. LUE swelling and well approximated incisions from new AVF Skin: No rashes, lesions or ulcers   Data Reviewed: I have personally reviewed following labs and imaging studies  CBC:  Recent Labs Lab 04/04/16 1201 04/06/16 0323 04/06/16 1629 04/08/16 1333  WBC 10.7* 11.3* 14.4* 11.4*  NEUTROABS  --   --   --  7.2  HGB 9.0* 8.9* 9.0* 8.8*  HCT 27.2* 28.0* 27.4* 27.8*  MCV 85.0 87.5 88.4 89.7  PLT 520* 526* 515* 093*   Basic Metabolic Panel:  Recent Labs Lab 04/04/16 1207 04/06/16 0323 04/06/16 1012 04/06/16 1629 04/08/16 1333  NA 131* 134* 133* 135 131*  K 5.2* 5.2* 5.5* 5.1 4.3  CL 98* 96* 97* 100* 92*  CO2 17* 22 20* 19* 25  GLUCOSE 99 92 92 95 132*  BUN 109* 87* 84* 86* 46*  CREATININE 11.12* 9.58* 10.14* 10.36* 7.83*  CALCIUM 8.4* 9.3 9.0 8.7* 8.9  PHOS 9.8*  --  8.2* 9.1*  --    GFR: Estimated Creatinine Clearance: 10.4 mL/min (by C-G formula based on SCr of 7.83 mg/dL (H)). Liver Function Tests:  Recent Labs Lab 04/04/16 1207 04/06/16 1012 04/06/16 1629 04/08/16 1333  AST  --   --   --  23  ALT  --   --   --  7*  ALKPHOS  --   --   --  52  BILITOT  --   --   --  0.4  PROT  --   --    --  6.5  ALBUMIN 3.0* 3.2* 3.1* 3.0*   No results for input(s): LIPASE, AMYLASE in the last 168 hours. No results for input(s): AMMONIA in the last 168 hours. Coagulation Profile:  Recent Labs Lab 04/06/16 0323  INR 1.03   Cardiac Enzymes: No results for input(s): CKTOTAL, CKMB, CKMBINDEX, TROPONINI in the last 168 hours. BNP (last 3 results) No results for input(s): PROBNP in the last 8760 hours. HbA1C: No results for input(s): HGBA1C in the last 72 hours. CBG: No results for input(s): GLUCAP in the last 168 hours. Lipid Profile: No results for input(s): CHOL, HDL, LDLCALC, TRIG, CHOLHDL, LDLDIRECT in the last 72 hours. Thyroid Function Tests:  Recent Labs  04/08/16 1333  TSH 0.402   Anemia Panel: No results for input(s): VITAMINB12, FOLATE, FERRITIN, TIBC, IRON, RETICCTPCT in the last 72 hours. Sepsis Labs: No results for input(s): PROCALCITON, LATICACIDVEN in the last 168 hours.  Recent Results (from the past 240 hour(s))  Rapid strep screen (not at [facility])     Status: None   Collection Time: 03/30/16  4:13 AM  Result Value Ref Range Status   Streptococcus, Group A Screen (Direct) NEGATIVE NEGATIVE Final    Comment: (NOTE) A Rapid Antigen test may result negative if the antigen level in the sample is below the detection level of this test. The FDA has not cleared this test as a stand-alone test therefore the rapid antigen negative result has reflexed to a Group A Strep culture.   Culture, group A strep     Status: None   Collection Time: 03/30/16  4:13 AM  Result Value Ref Range Status   Specimen Description THROAT  Final   Special Requests NONE Reflexed from [id number]  Final   Culture NO GROUP A STREP (S.PYOGENES) ISOLATED  Final   Report Status 04/01/2016 FINAL  Final  MRSA PCR Screening     Status: None   Collection Time: 03/30/16  4:30 AM  Result Value Ref Range Status   MRSA by PCR NEGATIVE NEGATIVE Final    Comment:        The GeneXpert MRSA Assay  (FDA approved for NASAL specimens only), is one component of a comprehensive MRSA colonization surveillance program. It is not intended to diagnose MRSA infection nor to guide or monitor treatment for MRSA infections.  Surgical pcr screen     Status: None   Collection Time: 04/05/16 10:36 AM  Result Value Ref Range Status   MRSA, PCR NEGATIVE NEGATIVE Final   Staphylococcus aureus NEGATIVE NEGATIVE Final    Comment:        The Xpert SA Assay (FDA approved for NASAL specimens in patients over 32 years of age), is one component of a comprehensive surveillance program.  Test performance has been validated by [facility] for patients greater than or equal to 56 year old. It is not intended to diagnose infection nor to guide or monitor treatment.      Radiology Studies: No results found.   Scheduled Meds: . darbepoetin (ARANESP) injection - DIALYSIS  150 mcg Intravenous Q Tue-HD  . [START ON 04/10/2016] ferric gluconate (FERRLECIT/NULECIT) IV  125 mg Intravenous Q M,W,F-HD  . heparin  5,000 Units Subcutaneous Q8H  . multivitamin  1 tablet Oral QHS   Continuous Infusions:   LOS: 10 days    Author: Berle Mull, MD [facility] 04/08/2016 5:54 PM    If 7PM-7AM, please contact night-coverage [email or web address] Password [email or web address] 04/08/2016, 5:53 PM

## 2016-04-08 NOTE — Progress Notes (Signed)
Spoke with the Renal M.D. on call,agreed to re-schedule Feric-gluconate on Monday post HD.

## 2016-04-08 NOTE — Progress Notes (Signed)
Patient ID: Dylan [name], male   DOB: August 26, 1960, 55 y.o.   MRN: [id number] S:His daughter reports that he again developed symptomatic hypotension last night and this morning and remains hypotensive.  This occurred over 5 hours after HD yesterday. O:BP (!) 93/54 (BP Location: Right Arm)   Pulse 96   Temp 98.6 F (37 C) (Oral)   Resp 20   Ht 5\' 5"  (1.651 m)   Wt 82.4 kg (181 lb 10.5 oz)   SpO2 99%   BMI 30.23 kg/m   Intake/Output Summary (Last 24 hours) at 04/08/16 0851 Last data filed at 04/08/16 0636  Gross per 24 hour  Intake              360 ml  Output             1000 ml  Net             -640 ml   Intake/Output: I/O last 3 completed shifts: In: 470 [P.O.:360; IV Piggyback:110] Out: 2600 [Urine:1600; Other:1000]  Intake/Output this shift:  No intake/output data recorded. Weight change:  Gen:WD WN HM in NAD CVS:rrr no rub Resp:c ta OYD:XAJOIN Ext:LUE AVF +T/B, no edema   Recent Labs Lab 04/04/16 1207 04/06/16 0323 04/06/16 1012 04/06/16 1629  NA 131* 134* 133* 135  K 5.2* 5.2* 5.5* 5.1  CL 98* 96* 97* 100*  CO2 17* 22 20* 19*  GLUCOSE 99 92 92 95  BUN 109* 87* 84* 86*  CREATININE 11.12* 9.58* 10.14* 10.36*  ALBUMIN 3.0*  --  3.2* 3.1*  CALCIUM 8.4* 9.3 9.0 8.7*  PHOS 9.8*  --  8.2* 9.1*   Liver Function Tests:  Recent Labs Lab 04/04/16 1207 04/06/16 1012 04/06/16 1629  ALBUMIN 3.0* 3.2* 3.1*   No results for input(s): LIPASE, AMYLASE in the last 168 hours. No results for input(s): AMMONIA in the last 168 hours. CBC:  Recent Labs Lab 04/04/16 1201 04/06/16 0323 04/06/16 1629  WBC 10.7* 11.3* 14.4*  HGB 9.0* 8.9* 9.0*  HCT 27.2* 28.0* 27.4*  MCV 85.0 87.5 88.4  PLT 520* 526* 515*   Cardiac Enzymes: No results for input(s): CKTOTAL, CKMB, CKMBINDEX, TROPONINI in the last 168 hours. CBG: No results for input(s): GLUCAP in the last 168 hours.  Iron Studies: No results for input(s): IRON, TIBC, TRANSFERRIN, FERRITIN in the last 72  hours. Studies/Results: Dg Chest Port 1 View  Result Date: 04/06/2016 CLINICAL DATA:  Status post hemodialysis catheter placement. EXAM: PORTABLE CHEST 1 VIEW COMPARISON:  Radiograph of March 29, 2016. FINDINGS: The heart size and mediastinal contours are within normal limits. Both lungs are clear. No pneumothorax or pleural effusion is noted. Pre-existing right internal jugular catheter is unchanged with distal tip in expected position of the SVC. Interval placement of new dialysis catheter into right internal jugular vein is noted with distal tip in expected position of the SVC. The visualized skeletal structures are unremarkable. IMPRESSION: Interval placement of new dialysis catheter into right internal jugular vein with distal tip in expected position of SVC. No pneumothorax is noted. Electronically Signed   By: Marijo Conception, M.D.   On: 04/06/2016 16:37   Dg Fluoro Guide Cv Line-no Report  Result Date: 04/06/2016 There is no Radiologist interpretation  for this exam.  . darbepoetin (ARANESP) injection - DIALYSIS  150 mcg Intravenous Q Tue-HD  . ferric gluconate (FERRLECIT/NULECIT) IV  125 mg Intravenous Q T,Th,Sa-HD  . heparin  5,000 Units Subcutaneous Q8H  . multivitamin  1 tablet  Oral QHS    BMET    Component Value Date/Time   NA 135 04/06/2016 1629   K 5.1 04/06/2016 1629   CL 100 (L) 04/06/2016 1629   CO2 19 (L) 04/06/2016 1629   GLUCOSE 95 04/06/2016 1629   BUN 86 (H) 04/06/2016 1629   CREATININE 10.36 (H) 04/06/2016 1629   CALCIUM 8.7 (L) 04/06/2016 1629   GFRNONAA 5 (L) 04/06/2016 1629   GFRAA 6 (L) 04/06/2016 1629   CBC    Component Value Date/Time   WBC 14.4 (H) 04/06/2016 1629   RBC 3.10 (L) 04/06/2016 1629   HGB 9.0 (L) 04/06/2016 1629   HCT 27.4 (L) 04/06/2016 1629   PLT 515 (H) 04/06/2016 1629   MCV 88.4 04/06/2016 1629   MCH 29.0 04/06/2016 1629   MCHC 32.8 04/06/2016 1629   RDW 14.9 04/06/2016 1629   LYMPHSABS 1.0 03/29/2016 1625   MONOABS 0.2  03/29/2016 1625   EOSABS 0.2 03/29/2016 1625   BASOSABS 0.0 03/29/2016 1625     Assessment/Plan:  1. Hypotension/dizziness- unclear if this is related to an arrythmia or other cardiac issue.  He should not be symptomatic this far out from HD.  Will check cortisol level and will need cardiac workup. 2. ESRD presumably due to HTN although not certain of exact etiology as he is visiting from [place] and had received 2 HD treatments there but recovered enough renal function to stop HD. He is now HD-dependent and has been doing well with IHD. Unclear where he will be staying as he cannot go back to [place] due to lack of medical equipment for ongoing HD.  1. Continue with IHD qMWF for now. 2. Await outpatient unit and schedule.  3. Vascular access- s/p RIJ tunneled HD catheter and single stage L BVAVF placed by Dr. Donzetta Matters on 04/06/16.  +T/B. 4. Anemia of CKD- on Aranesp 5. HTN-stable 6. SHPTH- will need to start binders and check iPTH 7. Disposition- will need SW/CM assistance as he is on tourist visa and will require life-sustaining medical therapy (hemodialysis) that he cannot receive in his native country.   Donetta Potts, MD [employer] [phone]

## 2016-04-08 NOTE — Progress Notes (Signed)
04/08/2016 11:38 AM Hemodialysis Outpatient Note; this patient has a tentative outpatient schedule at the [facility] center on Tuesday, Thursday and Saturday 2nd shift. If medical director approves, the center will anticipate beginning treatment on Tuesday December 12th at 12:15PM. Thank you. Jenetta Downer.

## 2016-04-08 NOTE — Progress Notes (Signed)
Incision on left upper arm looks good, no sign of any blood/drainage, and pt denies pain. Will continue to monitor.   Eleanora Neighbor, RN

## 2016-04-09 MED ORDER — CALCITRIOL 0.25 MCG PO CAPS
0.2500 ug | ORAL_CAPSULE | Freq: Every day | ORAL | Status: DC
  Administered 2016-04-09: 0.25 ug via ORAL
  Filled 2016-04-09: qty 1

## 2016-04-09 MED ORDER — LANTHANUM CARBONATE 500 MG PO CHEW
1000.0000 mg | CHEWABLE_TABLET | Freq: Three times a day (TID) | ORAL | Status: DC
  Administered 2016-04-09 (×2): 1000 mg via ORAL
  Filled 2016-04-09 (×2): qty 2

## 2016-04-09 NOTE — Progress Notes (Signed)
[facility] Progress Note  Patient: Dylan [name] ITG:[id number]   PCP: No primary care provider on file. DOB: 1960/09/29   DOA: 03/29/2016   DOS: 04/09/2016   Date of Service: the patient was seen and examined on 04/09/2016  Brief hospital course: 54 year old male with past medical history of hypertension CKD stage V not on dialysis, originally from [place], here visiting with family. Patient was in his usual health state and on 11/29, was feeling sick, sore throat, febrile, weak and lethargic. Patient came to the ED where was found to be hyperkalemic K >7.5 and BUN/Cr of 114 and 12.5. Patient had a HD cath placed and was emergently dialyzed, Now patient on ESRD needs long term HD.  PTA in [place] patient had an acute illness where he required dialysis for 2 days, and subsequently kidney function recovered to a baseline Cr of 1.0   Currently further plan is To arrange outpatient hemodialysis.  Assessment and Plan: 1. ESRD on hemodialysis Progressive CKD. Hyperkalemia and uremia. Improving with hemodialysis. CLIP process ongoing given patient is here with tourist visa, logistics are complicated  Patient may need medical asylum  CM and CSW appreciated  Had AVF placement 12/7 Unknown disposition at this point   2. Hyper and hypotension. Patient has occasional episodes of near syncopal events after hemodialysis. Cortisol level normal TSH and normal. Orthostatic vitals are normal. Unremarkable telemetry as well. Currently symptoms are resolved. Will need to continue to closely monitor.  3. Anemia of chronic disease 2/2 to CKD  Need for transfusion at this time  Iron def as well - Fe supplement per Renal  Aranesp with HD   Pain management: When necessary Robaxin Activity: No indication for physical therapy Bowel regimen: last BM 04/07/2016 Diet: Renal diet DVT Prophylaxis: subcutaneous Heparin  Advance goals of care discussion: Full code  Family Communication: family  was present at bedside, at the time of interview. The pt provided permission to discuss medical plan with the family. Opportunity was given to ask question and all questions were answered satisfactorily.   Disposition:  Discharge to home. Expected discharge date: 04/14/2016  Consultants: Nephrology, vascular surgery Procedures: Hemodialysis, AV fistula placement  Antibiotics: Anti-infectives    Start     Dose/Rate Route Frequency Ordered Stop   04/06/16 0600  ceFAZolin (ANCEF) IVPB 1 g/50 mL premix    Comments:  Send with pt to OR   1 g 100 mL/hr over 30 Minutes Intravenous To Surgery 04/05/16 0757 04/06/16 1315        Subjective: Denies any acute complaint. No recurrence of hypertensive episode.  Objective: Physical Exam: Vitals:   04/08/16 1704 04/08/16 2041 04/09/16 0547 04/09/16 0920  BP: 139/79 131/70 126/73 134/64  Pulse: 88 89 77 78  Resp: 20 17 16 16   Temp: 98.5 F (36.9 C) 98.7 F (37.1 C) 98.3 F (36.8 C) 98.4 F (36.9 C)  TempSrc: Oral Oral Oral Oral  SpO2: 98% 99% 98% 97%  Weight:  82.4 kg (181 lb 10.5 oz)    Height:        Intake/Output Summary (Last 24 hours) at 04/09/16 1514 Last data filed at 04/09/16 0900  Gross per 24 hour  Intake              540 ml  Output             1001 ml  Net             -461 ml   Filed Weights   04/07/16  1047 04/07/16 2153 04/08/16 2041  Weight: 82.3 kg (181 lb 7 oz) 82.4 kg (181 lb 10.5 oz) 82.4 kg (181 lb 10.5 oz)    General: Alert, Awake and Oriented to Time, Place and Person. Appear in mild distress, affect appropriate Eyes: PERRL, Conjunctiva normal ENT: Oral Mucosa clear moist. Neck: no JVD, no Abnormal Mass Or lumps Cardiovascular: S1 and S2 Present, aortic systolic Murmur, Respiratory: Bilateral Air entry equal and Decreased, no use of accessory muscle, Clear to Auscultation, no Crackles, no wheezes Abdomen: Bowel Sound present, Soft and no tenderness Skin: no redness, no Rash, no induration Extremities: no  Pedal edema, no calf tenderness Neurologic: Grossly no focal neuro deficit. Bilaterally Equal motor strength  Data Reviewed: CBC:  Recent Labs Lab 04/04/16 1201 04/06/16 0323 04/06/16 1629 04/08/16 1333  WBC 10.7* 11.3* 14.4* 11.4*  NEUTROABS  --   --   --  7.2  HGB 9.0* 8.9* 9.0* 8.8*  HCT 27.2* 28.0* 27.4* 27.8*  MCV 85.0 87.5 88.4 89.7  PLT 520* 526* 515* 572*   Basic Metabolic Panel:  Recent Labs Lab 04/04/16 1207 04/06/16 0323 04/06/16 1012 04/06/16 1629 04/08/16 1333  NA 131* 134* 133* 135 131*  K 5.2* 5.2* 5.5* 5.1 4.3  CL 98* 96* 97* 100* 92*  CO2 17* 22 20* 19* 25  GLUCOSE 99 92 92 95 132*  BUN 109* 87* 84* 86* 46*  CREATININE 11.12* 9.58* 10.14* 10.36* 7.83*  CALCIUM 8.4* 9.3 9.0 8.7* 8.9  PHOS 9.8*  --  8.2* 9.1*  --     Liver Function Tests:  Recent Labs Lab 04/04/16 1207 04/06/16 1012 04/06/16 1629 04/08/16 1333  AST  --   --   --  23  ALT  --   --   --  7*  ALKPHOS  --   --   --  52  BILITOT  --   --   --  0.4  PROT  --   --   --  6.5  ALBUMIN 3.0* 3.2* 3.1* 3.0*   No results for input(s): LIPASE, AMYLASE in the last 168 hours. No results for input(s): AMMONIA in the last 168 hours. Coagulation Profile:  Recent Labs Lab 04/06/16 0323  INR 1.03   Cardiac Enzymes: No results for input(s): CKTOTAL, CKMB, CKMBINDEX, TROPONINI in the last 168 hours. BNP (last 3 results) No results for input(s): PROBNP in the last 8760 hours.  CBG: No results for input(s): GLUCAP in the last 168 hours.  Studies: No results found.   Scheduled Meds: . calcitRIOL  0.25 mcg Oral Daily  . darbepoetin (ARANESP) injection - DIALYSIS  150 mcg Intravenous Q Tue-HD  . [START ON 04/10/2016] ferric gluconate (FERRLECIT/NULECIT) IV  125 mg Intravenous Q M,W,F-HD  . heparin  5,000 Units Subcutaneous Q8H  . lanthanum  1,000 mg Oral TID WC  . multivitamin  1 tablet Oral QHS   Continuous Infusions: PRN Meds: acetaminophen, calcium carbonate, methocarbamol,  ondansetron (ZOFRAN) IV  Time spent: 30 minutes  Author: Berle Mull, MD [facility] Pager: [phone] 04/09/2016 3:14 PM  If 7PM-7AM, please contact night-coverage at [email or web address], password [facility]

## 2016-04-09 NOTE — Anesthesia Postprocedure Evaluation (Signed)
Anesthesia Post Note  Patient: Dylan [name]  Procedure(s) Performed: Procedure(s) (LRB): INSERTION OF Tunneled DIALYSIS CATHETER RIGHT INTERNAL JUGULAR (Right) Left Arm Single Stage Bascilic Vein Transposition (Left)  Patient location during evaluation: PACU Anesthesia Type: General Level of consciousness: awake and alert Pain management: pain level controlled Vital Signs Assessment: post-procedure vital signs reviewed and stable Respiratory status: spontaneous breathing, nonlabored ventilation, respiratory function stable and patient connected to nasal cannula oxygen Cardiovascular status: blood pressure returned to baseline and stable Postop Assessment: no signs of nausea or vomiting Anesthetic complications: no    Last Vitals:  Vitals:   04/08/16 2041 04/09/16 0547  BP: 131/70 126/73  Pulse: 89 77  Resp: 17 16  Temp: 37.1 C 36.8 C    Last Pain:  Vitals:   04/09/16 0547  TempSrc: Oral  PainSc:                  [name]

## 2016-04-09 NOTE — Progress Notes (Signed)
Patient ID: Granite Godman, male   DOB: 1960/08/25, 55 y.o.   MRN: [id number] S:Feeling better today O:BP 134/64 (BP Location: Right Arm)   Pulse 78   Temp 98.4 F (36.9 C) (Oral)   Resp 16   Ht 5\' 5"  (1.651 m)   Wt 82.4 kg (181 lb 10.5 oz)   SpO2 97%   BMI 30.23 kg/m   Intake/Output Summary (Last 24 hours) at 04/09/16 0942 Last data filed at 04/09/16 0900  Gross per 24 hour  Intake              780 ml  Output             1001 ml  Net             -221 ml   Intake/Output: I/O last 3 completed shifts: In: 900 [P.O.:900] Out: 1001 [Urine:1000; Stool:1]  Intake/Output this shift:  Total I/O In: 240 [P.O.:240] Out: 0  Weight change: -1.3 kg (-2 lb 13.9 oz) Gen:WD WN NAD CVS:no rub Resp:cta YBF:XOVANV Ext:no edema, LUE AVF +T/B with ecchymosis in antecubital area   Recent Labs Lab 04/04/16 1207 04/06/16 0323 04/06/16 1012 04/06/16 1629 04/08/16 1333  NA 131* 134* 133* 135 131*  K 5.2* 5.2* 5.5* 5.1 4.3  CL 98* 96* 97* 100* 92*  CO2 17* 22 20* 19* 25  GLUCOSE 99 92 92 95 132*  BUN 109* 87* 84* 86* 46*  CREATININE 11.12* 9.58* 10.14* 10.36* 7.83*  ALBUMIN 3.0*  --  3.2* 3.1* 3.0*  CALCIUM 8.4* 9.3 9.0 8.7* 8.9  PHOS 9.8*  --  8.2* 9.1*  --   AST  --   --   --   --  23  ALT  --   --   --   --  7*   Liver Function Tests:  Recent Labs Lab 04/06/16 1012 04/06/16 1629 04/08/16 1333  AST  --   --  23  ALT  --   --  7*  ALKPHOS  --   --  52  BILITOT  --   --  0.4  PROT  --   --  6.5  ALBUMIN 3.2* 3.1* 3.0*   No results for input(s): LIPASE, AMYLASE in the last 168 hours. No results for input(s): AMMONIA in the last 168 hours. CBC:  Recent Labs Lab 04/04/16 1201 04/06/16 0323 04/06/16 1629 04/08/16 1333  WBC 10.7* 11.3* 14.4* 11.4*  NEUTROABS  --   --   --  7.2  HGB 9.0* 8.9* 9.0* 8.8*  HCT 27.2* 28.0* 27.4* 27.8*  MCV 85.0 87.5 88.4 89.7  PLT 520* 526* 515* 532*   Cardiac Enzymes: No results for input(s): CKTOTAL, CKMB, CKMBINDEX, TROPONINI in the  last 168 hours. CBG: No results for input(s): GLUCAP in the last 168 hours.  Iron Studies: No results for input(s): IRON, TIBC, TRANSFERRIN, FERRITIN in the last 72 hours. Studies/Results: No results found. . darbepoetin (ARANESP) injection - DIALYSIS  150 mcg Intravenous Q Tue-HD  . [START ON 04/10/2016] ferric gluconate (FERRLECIT/NULECIT) IV  125 mg Intravenous Q M,W,F-HD  . heparin  5,000 Units Subcutaneous Q8H  . multivitamin  1 tablet Oral QHS    BMET    Component Value Date/Time   NA 131 (L) 04/08/2016 1333   K 4.3 04/08/2016 1333   CL 92 (L) 04/08/2016 1333   CO2 25 04/08/2016 1333   GLUCOSE 132 (H) 04/08/2016 1333   BUN 46 (H) 04/08/2016 1333   CREATININE 7.83 (H) 04/08/2016  1333   CALCIUM 8.9 04/08/2016 1333   GFRNONAA 7 (L) 04/08/2016 1333   GFRAA 8 (L) 04/08/2016 1333   CBC    Component Value Date/Time   WBC 11.4 (H) 04/08/2016 1333   RBC 3.10 (L) 04/08/2016 1333   HGB 8.8 (L) 04/08/2016 1333   HCT 27.8 (L) 04/08/2016 1333   PLT 532 (H) 04/08/2016 1333   MCV 89.7 04/08/2016 1333   MCH 28.4 04/08/2016 1333   MCHC 31.7 04/08/2016 1333   RDW 15.0 04/08/2016 1333   LYMPHSABS 2.8 04/08/2016 1333   MONOABS 1.1 (H) 04/08/2016 1333   EOSABS 0.3 04/08/2016 1333   BASOSABS 0.1 04/08/2016 1333     Assessment/Plan:  1. Hypotension/dizziness- unclear if this is related to an arrythmia or other cardiac issue.  He should not be symptomatic this far out from HD.  Will check cortisol level and will need cardiac workup. 2. ESRD presumably due to HTN although not certain of exact etiology as he is visiting from [place] and had received 2 HD treatments there but recovered enough renal function to stop HD. He is now HD-dependent and has been doing well with IHD. Unclear where he will be staying as he cannot go back to [place] due to lack of medical equipment for ongoing HD.  1. Continue with IHD qMWF for now but looks like he may have a TTS schedule in  [place]. 2. Await outpatient unit acceptance and schedule.  3. Vascular access- s/p RIJtunneled HD catheter and single stage L BVAVF placed by Dr. Donzetta Matters on 04/06/16. +T/B. 4. Anemia of CKD- on Aranesp 5. HTN-stable 6. SHPTH- will need to start binders and check iPTH 7. Disposition- will need SW/CM assistance as he is on tourist visa and will require life-sustaining medical therapy (hemodialysis) that he cannot receive in his native country.   Donetta Potts, MD [employer] [phone]

## 2016-04-10 LAB — CBC WITH DIFFERENTIAL/PLATELET
Basophils Absolute: 0.1 10*3/uL (ref 0.0–0.1)
Basophils Relative: 1 %
Eosinophils Absolute: 0.4 10*3/uL (ref 0.0–0.7)
Eosinophils Relative: 4 %
HCT: 26.6 % — ABNORMAL LOW (ref 39.0–52.0)
Hemoglobin: 8.6 g/dL — ABNORMAL LOW (ref 13.0–17.0)
Lymphocytes Relative: 27 %
Lymphs Abs: 2.5 10*3/uL (ref 0.7–4.0)
MCH: 29.1 pg (ref 26.0–34.0)
MCHC: 32.3 g/dL (ref 30.0–36.0)
MCV: 89.9 fL (ref 78.0–100.0)
Monocytes Absolute: 0.7 10*3/uL (ref 0.1–1.0)
Monocytes Relative: 7 %
Neutro Abs: 5.7 10*3/uL (ref 1.7–7.7)
Neutrophils Relative %: 61 %
Platelets: 535 10*3/uL — ABNORMAL HIGH (ref 150–400)
RBC: 2.96 MIL/uL — ABNORMAL LOW (ref 4.22–5.81)
RDW: 15.3 % (ref 11.5–15.5)
WBC: 9.4 10*3/uL (ref 4.0–10.5)

## 2016-04-10 LAB — RENAL FUNCTION PANEL
Albumin: 3 g/dL — ABNORMAL LOW (ref 3.5–5.0)
Anion gap: 14 (ref 5–15)
BUN: 78 mg/dL — ABNORMAL HIGH (ref 6–20)
CO2: 23 mmol/L (ref 22–32)
Calcium: 9.2 mg/dL (ref 8.9–10.3)
Chloride: 95 mmol/L — ABNORMAL LOW (ref 101–111)
Creatinine, Ser: 10.14 mg/dL — ABNORMAL HIGH (ref 0.61–1.24)
GFR calc Af Amer: 6 mL/min — ABNORMAL LOW (ref >60)
GFR calc non Af Amer: 5 mL/min — ABNORMAL LOW (ref >60)
Glucose, Bld: 90 mg/dL (ref 65–99)
Phosphorus: 7.6 mg/dL — ABNORMAL HIGH (ref 2.5–4.6)
Potassium: 4.9 mmol/L (ref 3.5–5.1)
Sodium: 132 mmol/L — ABNORMAL LOW (ref 135–145)

## 2016-04-10 LAB — MAGNESIUM: Magnesium: 2.1 mg/dL (ref 1.7–2.4)

## 2016-04-10 MED ORDER — RENA-VITE PO TABS: 1.0000 | ORAL_TABLET | Freq: Every day | ORAL | 0 refills | Status: DC

## 2016-04-10 MED ORDER — HEPARIN SODIUM (PORCINE) 1000 UNIT/ML DIALYSIS: 20.0000 [IU]/kg | INTRAMUSCULAR | Status: DC | PRN

## 2016-04-10 MED ORDER — LANTHANUM CARBONATE 1000 MG PO CHEW: 1000.0000 mg | CHEWABLE_TABLET | Freq: Three times a day (TID) | ORAL | 0 refills | Status: DC

## 2016-04-10 MED ORDER — CALCITRIOL 0.25 MCG PO CAPS: 0.2500 ug | ORAL_CAPSULE | Freq: Every day | ORAL | 0 refills | Status: DC

## 2016-04-10 NOTE — Procedures (Signed)
I was present at this dialysis session. I have reviewed the session itself and made appropriate changes.   Informed of [facility] [place] [facility]. 1L UF.  2K bath. +B/T of LUE AVF; using TDC.  Feels well w/o concerns.   Filed Weights   04/07/16 2153 04/08/16 2041 04/10/16 0803  Weight: 82.4 kg (181 lb 10.5 oz) 82.4 kg (181 lb 10.5 oz) 84.7 kg (186 lb 11.7 oz)     Recent Labs Lab 04/10/16 0706  NA 132*  K 4.9  CL 95*  CO2 23  GLUCOSE 90  BUN 78*  CREATININE 10.14*  CALCIUM 9.2  PHOS 7.6*     Recent Labs Lab 04/06/16 1629 04/08/16 1333 04/10/16 0706  WBC 14.4* 11.4* 9.4  NEUTROABS  --  7.2 5.7  HGB 9.0* 8.8* 8.6*  HCT 27.4* 27.8* 26.6*  MCV 88.4 89.7 89.9  PLT 515* 532* 535*    Scheduled Meds: . calcitRIOL  0.25 mcg Oral Daily  . darbepoetin (ARANESP) injection - DIALYSIS  150 mcg Intravenous Q Tue-HD  . ferric gluconate (FERRLECIT/NULECIT) IV  125 mg Intravenous Q M,W,F-HD  . heparin  5,000 Units Subcutaneous Q8H  . lanthanum  1,000 mg Oral TID WC  . multivitamin  1 tablet Oral QHS   Continuous Infusions: PRN Meds:.acetaminophen, calcium carbonate, heparin, methocarbamol, ondansetron (ZOFRAN) IV   Pearson Grippe  MD 04/10/2016, 8:58 AM

## 2016-04-10 NOTE — Progress Notes (Signed)
Dylan [name] to be D/C'd Home per MD order.  Discussed prescriptions and follow up appointments with the patient. Prescriptions given to patient, medication list explained in detail. Pt verbalized understanding.    Medication List    STOP taking these medications   carvedilol 6.25 MG tablet Commonly known as:  COREG   furosemide 20 MG tablet Commonly known as:  LASIX   NIFEdipine 60 MG 24 hr tablet Commonly known as:  PROCARDIA-XL/ADALAT CC   NON FORMULARY     TAKE these medications   calcitRIOL 0.25 MCG capsule Commonly known as:  ROCALTROL Take 1 capsule (0.25 mcg total) by mouth daily.   calcium carbonate 500 MG chewable tablet Commonly known as:  TUMS - dosed in mg elemental calcium Chew 1 tablet by mouth daily as needed for indigestion or heartburn.   epoetin alfa 4000 UNIT/ML injection Commonly known as:  EPOGEN,PROCRIT Inject 4,000 Units into the skin once a week. TUESDAYS   lanthanum 1000 MG chewable tablet Commonly known as:  FOSRENOL Chew 1 tablet (1,000 mg total) by mouth 3 (three) times daily with meals.   multivitamin Tabs tablet Take 1 tablet by mouth at bedtime.   UNABLE TO FIND This note is to certify that [name] is currently with her father Mr.Teaghan Bentz who is currently hospitalized at [facility] in [place], [place], [place] and hence she is unable to travel outside of the country       Vitals:   04/10/16 1130 04/10/16 1145  BP: 132/75 (!) 150/80  Pulse: 78 82  Resp: 16 16  Temp:  98.6 F (37 C)    Skin clean, dry and intact without evidence of skin break down, no evidence of skin tears noted. IV catheter discontinued intact. Site without signs and symptoms of complications. Dressing and pressure applied. Pt denies pain at this time. No complaints noted.  An After Visit Summary was printed and given to the patient. Patient escorted via [place], and D/C home via private auto.  Retta Mac BSN, RN

## 2016-04-10 NOTE — Progress Notes (Signed)
04/10/2016 11:02 AM Hemodialysis Outpatient Update; The [facility] will anticipate Dylan [name] tomorrow at 11:30AM to sign paperwork and consents. He will need to have someone to assist with interpretation for the initial signing of consents. Family will need to provide transportation to and from the center. Thank you. Jenetta Downer.

## 2016-04-11 NOTE — Discharge Summary (Signed)
[facility] Discharge Summary   Patient: Dylan [name] QAS:[id number]   PCP: No primary care provider on file. DOB: 1960/09/09   Date of admission: 03/29/2016   Date of discharge: 04/10/2016     Discharge Diagnoses:  Principal Problem:   ESRD (end stage renal disease) ([place]) Active Problems:   Hyperkalemia, diminished renal excretion   Uremia of renal origin   HTN (hypertension)   Metabolic acidosis, NAG, failure of bicarbonate regeneration   History of anemia due to CKD   Admitted From: Home Disposition:  Home with family  Recommendations for Outpatient Follow-up:  1. Follow-up with vascular surgery, establish care with nephrology as an outpatient follow-up with them for further care.   Follow-up Information    Dylan Snare, MD Follow up in 6 week(s).   Specialties:  Vascular Surgery, Cardiology Why:  Office will call you to arrange your appt (sent) (If you are still in town). Contact information: [address] [place] [address] [phone]          Diet recommendation: Renal diet  Activity: The patient is advised to gradually reintroduce usual activities.  Discharge Condition: good  Code Status: Full code  History of present illness: As per the H and P dictated on admission, "Dylan [name] is a 55 y.o. male with medical history significant of HTN, and what sounds like CKD stage 5 at baseline, has had dialysis 2x previously in [place] but was not put on chronic dialysis.  He is here on vacation with family.  Arrived on Nov 20th and planning to leave Dec 7th although his [name] is for 6 months (can legally remain in [place] for that duration).  [name] is a physician (pediatrician) and helps provide the history. 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 He had an acute illness in February (in [place]) with fever, vomiting, and diarrhea. He went to the hospital and was told he needed dialysis. He had two dialysis treatments and recovered enough  renal function so that he didn't need permanent dialysis. He was told to get a fistula placement but he wasn't able to find all of the supplies needed to schedule the surgery (pts need to find their own supplies for surgical/ medical procedures according to dtr).   He has been feeling well with no change in urination. Two days ago he reported a cough and sore throat and so has been getting amoxicillin 500 mg daily. He has noted no change in his urination. Dtr has noted perhaps a small change in appetite and also noted that he has been a little more sleepy than usual.   Dtr is very concerned about him needing dialysis again. She reports he had a seizure after his first dialysis treatment in [place]. She knows it was not a full session; she thinks it may have been a 30 minute session."  Hospital Course:  Summary of his active problems in the hospital is as following. 1. ESRD on hemodialysis Progressive CKD. Hyperkalemia and uremia. Improving with hemodialysis. She has availability for hemodialysis at [facility]. CM and CSW appreciated  Had AVF placement 12/7 Recommendation I discussed with nephrology as an outpatient for increased urinary output down the road.  2. Hyper and hypotension. Patient has occasional episodes of near syncopal events after hemodialysis. Cortisol level normal TSH and normal. Orthostatic vitals are normal. Unremarkable telemetry as well. Currently symptoms are resolved.  3. Anemia of chronic disease 2/2 to CKD  No Need for transfusion at this time  Iron  def as well - Fe supplement per Renal  Aranesp with HD   All other chronic medical condition were stable during the hospitalization.  Patient was ambulatory without any assistance. On the day of the discharge the patient's vitals are stable, and no other acute medical condition were reported by patient. the patient was felt safe to be discharge at home with family.  Procedures and  Results:  Hemodialysis, AV fistula placement, TDC placement   Consultations:  Nephrology  DISCHARGE MEDICATION: Discharge Medication List as of 04/10/2016  3:58 PM    START taking these medications   Details  calcitRIOL (ROCALTROL) 0.25 MCG capsule Take 1 capsule (0.25 mcg total) by mouth daily., Starting Mon 04/10/2016, Normal    lanthanum (FOSRENOL) 1000 MG chewable tablet Chew 1 tablet (1,000 mg total) by mouth 3 (three) times daily with meals., Starting Mon 04/10/2016, Normal    multivitamin (RENA-VIT) TABS tablet Take 1 tablet by mouth at bedtime., Starting Mon 04/10/2016, Normal    UNABLE TO FIND This note is to certify that Ms.Dylan [name] is currently with her father Mr.Dylan [name] who is currently hospitalized at [facility] in [place], [place], [place] and hence she is unable to travel outside of the country, [employer] these medications which have NOT CHANGED   Details  calcium carbonate (TUMS - DOSED IN MG ELEMENTAL CALCIUM) 500 MG chewable tablet Chew 1 tablet by mouth daily as needed for indigestion or heartburn., Historical Med    epoetin alfa (EPOGEN,PROCRIT) 4000 UNIT/ML injection Inject 4,000 Units into the skin once a week. TUESDAYS, Historical Med      STOP taking these medications     carvedilol (COREG) 6.25 MG tablet      furosemide (LASIX) 20 MG tablet      NIFEdipine (PROCARDIA-XL/ADALAT CC) 60 MG 24 hr tablet      NON FORMULARY        No Known Allergies Discharge Instructions    Diet renal [id number]    Complete by:  As directed    Discharge instructions    Complete by:  As directed    It is important that you read following instructions as well as go over your medication list with RN to help you understand your care after this hospitalization.  Discharge Instructions:  Please request your primary care physician to go over all Hospital Tests and Procedure/Radiological results at the follow up,  Please get all Hospital  records sent to your PCP by signing hospital release before you go home.   Do not take more than prescribed Pain, Sleep and Anxiety Medications. You were cared for by a hospitalist during your hospital stay. If you have any questions about your discharge medications or the care you received while you were in the hospital after you are discharged, you can call the unit and ask to speak with the hospitalist on call if the hospitalist that took care of you is not available.  Once you are discharged, your primary care physician will handle any further medical issues. Please note that NO REFILLS for any discharge medications will be authorized once you are discharged, as it is imperative that you return to your primary care physician (or establish a relationship with a primary care physician if you do not have one) for your aftercare needs so that they can reassess your need for medications and monitor your lab values. You Must read complete instructions/literature along with all the possible adverse reactions/side effects for all  the Medicines you take and that have been prescribed to you. Take any new Medicines after you have completely understood and accept all the possible adverse reactions/side effects. Wear Seat belts while driving. If you have smoked or chewed Tobacco in the last 2 yrs please stop smoking and/or stop any Recreational drug use.   Increase activity slowly    Complete by:  As directed      Discharge Exam: Filed Weights   04/08/16 2041 04/10/16 0803 04/10/16 1145  Weight: 82.4 kg (181 lb 10.5 oz) 84.7 kg (186 lb 11.7 oz) 83.7 kg (184 lb 8.4 oz)   Vitals:   04/10/16 1130 04/10/16 1145  BP: 132/75 (!) 150/80  Pulse: 78 82  Resp: 16 16  Temp:  98.6 F (37 C)   General: Appear in no distress, no Rash; Oral Mucosa moist. Cardiovascular: S1 and S2 Present, n Murmur, no JVD Respiratory: Bilateral Air entry present and Clear to Auscultation, no Crackles, no wheezes Abdomen: Bowel  Sound present, Soft and no tenderness Extremities: no Pedal edema, no calf tenderness Neurology: Grossly no focal neuro deficit.  The results of significant diagnostics from this hospitalization (including imaging, microbiology, ancillary and laboratory) are listed below for reference.    Significant Diagnostic Studies: Dg Chest 2 View  Result Date: 03/29/2016 CLINICAL DATA:  Cough, fatigue, loss of appetite and fever for 3 days, history hypertension, renal disorder EXAM: CHEST  2 VIEW COMPARISON:  None FINDINGS: Enlargement of cardiac silhouette. Mediastinal contours and pulmonary vascularity normal. Minimal peribronchial thickening. No pulmonary infiltrate, pleural effusion, or pneumothorax. Osseous structures unremarkable. IMPRESSION: Enlargement of cardiac silhouette. Minimal bronchitic change without infiltrate. Electronically Signed   By: Lavonia Dana M.D.   On: 03/29/2016 16:58   Ct Head Wo Contrast  Result Date: 03/31/2016 CLINICAL DATA:  55 year old male with progressive chronic renal disease and hyperkalemia. Seizures. Initial encounter. EXAM: CT HEAD WITHOUT CONTRAST TECHNIQUE: Contiguous axial images were obtained from the base of the skull through the vertex without intravenous contrast. COMPARISON:  None. FINDINGS: Brain: No midline shift, ventriculomegaly, mass effect, evidence of mass lesion, intracranial hemorrhage or evidence of cortically based acute infarction. Gray-white matter differentiation is within normal limits throughout the brain. No cortical encephalomalacia identified. Vascular: No suspicious intracranial vascular hyperdensity. Skull: No acute osseous abnormality identified. Sinuses/Orbits: Bubbly opacity in both maxillary sinuses and the right sphenoid sinus. Fluid level in the right sphenoid. Mild to moderate paranasal sinus mucosal thickening elsewhere. Chronic appearing opacification and sclerosis of the right mastoid air cells. Left mastoids and both tympanic cavities  are clear. Other: No acute orbit or scalp soft tissue findings. IMPRESSION: 1. Negative noncontrast CT appearance of the brain. 2. Paranasal sinus disease with some fluid levels and bubbly opacity suggesting acute sinusitis. Electronically Signed   By: [name] M.D.   On: 03/31/2016 16:52   Dg Chest Port 1 View  Result Date: 04/06/2016 CLINICAL DATA:  Status post hemodialysis catheter placement. EXAM: PORTABLE CHEST 1 VIEW COMPARISON:  Radiograph of March 29, 2016. FINDINGS: The heart size and mediastinal contours are within normal limits. Both lungs are clear. No pneumothorax or pleural effusion is noted. Pre-existing right internal jugular catheter is unchanged with distal tip in expected position of the SVC. Interval placement of new dialysis catheter into right internal jugular vein is noted with distal tip in expected position of the SVC. The visualized skeletal structures are unremarkable. IMPRESSION: Interval placement of new dialysis catheter into right internal jugular vein with distal tip in expected  position of SVC. No pneumothorax is noted. Electronically Signed   By: Marijo Conception, M.D.   On: 04/06/2016 16:37   Dg Chest Port 1 View  Result Date: 03/29/2016 CLINICAL DATA:  Central line placement.  Initial encounter. EXAM: PORTABLE CHEST 1 VIEW COMPARISON:  Chest radiograph performed earlier today at 4:36 p.m. FINDINGS: The patient's right IJ line is noted ending about the mid SVC. The lungs are well-aerated. Pulmonary vascularity is at the upper limits of normal. There is no evidence of focal opacification, pleural effusion or pneumothorax. The cardiomediastinal silhouette is borderline normal in size. No acute osseous abnormalities are seen. IMPRESSION: 1. Right IJ line noted ending about the mid SVC. 2. No acute cardiopulmonary process seen. Electronically Signed   By: Garald Balding M.D.   On: 03/29/2016 22:39   Dg Fluoro Guide Cv Line-no Report  Result Date: 04/06/2016 There is no  Radiologist interpretation  for this exam.   Microbiology: Recent Results (from the past 240 hour(s))  Surgical pcr screen     Status: None   Collection Time: 04/05/16 10:36 AM  Result Value Ref Range Status   MRSA, PCR NEGATIVE NEGATIVE Final   Staphylococcus aureus NEGATIVE NEGATIVE Final    Comment:        The Xpert SA Assay (FDA approved for NASAL specimens in patients over 58 years of age), is one component of a comprehensive surveillance program.  Test performance has been validated by [facility] for patients greater than or equal to 63 year old. It is not intended to diagnose infection nor to guide or monitor treatment.      Labs: CBC:  Recent Labs Lab 04/06/16 0323 04/06/16 1629 04/08/16 1333 04/10/16 0706  WBC 11.3* 14.4* 11.4* 9.4  NEUTROABS  --   --  7.2 5.7  HGB 8.9* 9.0* 8.8* 8.6*  HCT 28.0* 27.4* 27.8* 26.6*  MCV 87.5 88.4 89.7 89.9  PLT 526* 515* 532* 629*   Basic Metabolic Panel:  Recent Labs Lab 04/06/16 0323 04/06/16 1012 04/06/16 1629 04/08/16 1333 04/10/16 0706  NA 134* 133* 135 131* 132*  K 5.2* 5.5* 5.1 4.3 4.9  CL 96* 97* 100* 92* 95*  CO2 22 20* 19* 25 23  GLUCOSE 92 92 95 132* 90  BUN 87* 84* 86* 46* 78*  CREATININE 9.58* 10.14* 10.36* 7.83* 10.14*  CALCIUM 9.3 9.0 8.7* 8.9 9.2  MG  --   --   --   --  2.1  PHOS  --  8.2* 9.1*  --  7.6*   Liver Function Tests:  Recent Labs Lab 04/06/16 1012 04/06/16 1629 04/08/16 1333 04/10/16 0706  AST  --   --  23  --   ALT  --   --  7*  --   ALKPHOS  --   --  52  --   BILITOT  --   --  0.4  --   PROT  --   --  6.5  --   ALBUMIN 3.2* 3.1* 3.0* 3.0*   Time spent: 30 minutes  Signed:  Eldin Bonsell  [facility] 04/10/2016 , 6:06 PM

## 2016-05-25 ENCOUNTER — Other Ambulatory Visit: Payer: Self-pay

## 2016-05-25 DIAGNOSIS — N186 End stage renal disease: Secondary | ICD-10-CM

## 2016-05-29 ENCOUNTER — Encounter: Payer: Self-pay | Admitting: Vascular Surgery

## 2016-06-02 ENCOUNTER — Ambulatory Visit (HOSPITAL_COMMUNITY)
Admit: 2016-06-02 | Discharge: 2016-06-02 | Disposition: A | Discharge status: Home / Self Care | Payer: Medicaid Other | Attending: Vascular Surgery | Admitting: Vascular Surgery

## 2016-06-02 ENCOUNTER — Encounter: Payer: Self-pay | Admitting: Vascular Surgery

## 2016-06-02 ENCOUNTER — Ambulatory Visit (INDEPENDENT_AMBULATORY_CARE_PROVIDER_SITE_OTHER): Payer: Self-pay | Admitting: Vascular Surgery

## 2016-06-02 VITALS — BP 129/69 | HR 82 | Temp 97.9°F | Resp 16 | Ht 67.0 in | Wt 198.0 lb

## 2016-06-02 DIAGNOSIS — Z992 Dependence on renal dialysis: Secondary | ICD-10-CM

## 2016-06-02 DIAGNOSIS — I770 Arteriovenous fistula, acquired: Secondary | ICD-10-CM | POA: Insufficient documentation

## 2016-06-02 DIAGNOSIS — N186 End stage renal disease: Secondary | ICD-10-CM | POA: Insufficient documentation

## 2016-06-02 NOTE — Progress Notes (Signed)
Subjective:     Patient ID: Dylan [name], male   DOB: [birth year], 56 y.o.   MRN: [id number]  HPI 56 yo male returns for follow-up of recent placement single stage basilic vein transposition fistula. He has no left arm pain. He does have some numbness at his incision sites. There 92 months out from his fistula but he has used it on a couple occasions. He still has his tunneled dialysis catheter which she has itching that is his chief complaint today. He is on dialysis 3 times a week he has no other complaints at this time.   Review of Systems No complaints related to today's visit.    Objective:   Physical Exam [id number] Non labored respirations Left arm with palpable radial pulse, strong thrill in avf Hand is warm and strength 5/5    Assessment/Plan     Patient returns following single stage basilic vein transposition 2 months ago. He is already used to fistula. I have encouraged him to keep the tunneled dialysis catheter given his social situation and we can remove it in 1 month. The distal want anything to happen to the fistula in the interim where he would require replacement of the [facility]. If he is still here in 1 month he can follow-up for tunneled dialysis catheter removal.   Coco Sharpnack C. Donzetta Matters, MD [facility] Office: [phone] Pager: [phone]

## 2016-06-05 ENCOUNTER — Encounter: Payer: Self-pay | Admitting: Vascular Surgery

## 2016-06-05 ENCOUNTER — Telehealth: Payer: Self-pay | Admitting: *Deleted

## 2016-06-05 ENCOUNTER — Encounter: Payer: Self-pay | Admitting: *Deleted

## 2016-06-05 NOTE — Telephone Encounter (Signed)
Patient sent in a MyChart message to ask if his [facility] could be removed now. I sent him back the following message via MyChart; According to Dr.Cain's note from Friday, 06-02-16, your catheter should stay in place for one more month. Please have your dialysis center contact us in a month and we will schedule the removal with them.

## 2016-06-20 ENCOUNTER — Other Ambulatory Visit (HOSPITAL_COMMUNITY): Payer: Self-pay | Admitting: Nephrology

## 2016-06-20 DIAGNOSIS — N186 End stage renal disease: Secondary | ICD-10-CM

## 2016-06-21 ENCOUNTER — Ambulatory Visit (HOSPITAL_COMMUNITY): Admission: RE | Admit: 2016-06-21 | Payer: MEDICAID | Source: Ambulatory Visit

## 2016-06-26 ENCOUNTER — Ambulatory Visit (HOSPITAL_COMMUNITY)
Admission: RE | Admit: 2016-06-26 | Discharge: 2016-06-26 | Disposition: A | Discharge status: Home / Self Care | Payer: Medicaid Other | Source: Ambulatory Visit | Attending: Nephrology | Admitting: Nephrology

## 2016-06-26 ENCOUNTER — Encounter (HOSPITAL_COMMUNITY): Payer: Self-pay | Admitting: Interventional Radiology

## 2016-06-26 DIAGNOSIS — N186 End stage renal disease: Secondary | ICD-10-CM | POA: Diagnosis not present

## 2016-06-26 DIAGNOSIS — Z452 Encounter for adjustment and management of vascular access device: Secondary | ICD-10-CM | POA: Insufficient documentation

## 2016-06-26 DIAGNOSIS — Z992 Dependence on renal dialysis: Secondary | ICD-10-CM | POA: Insufficient documentation

## 2016-06-26 HISTORY — PX: IR GENERIC HISTORICAL: IMG1180011

## 2016-06-26 MED ORDER — CHLORHEXIDINE GLUCONATE 4 % EX LIQD
CUTANEOUS | Status: AC
  Filled 2016-06-26: qty 15

## 2016-06-26 MED ORDER — LIDOCAINE HCL 1 % IJ SOLN
INTRAMUSCULAR | Status: DC | PRN
  Administered 2016-06-26: 10 mL

## 2016-12-07 ENCOUNTER — Encounter: Payer: Self-pay | Admitting: Cardiology

## 2016-12-07 ENCOUNTER — Ambulatory Visit: Payer: Medicaid Other | Admitting: Cardiology

## 2016-12-07 NOTE — Progress Notes (Deleted)
Clinical Summary Dylan [name] is a 56 y.o.male seen as new patient  1. Palpitations  2. HTN  3. ESRD    Past Medical History:  Diagnosis Date  . Hypertension   . Renal disorder      No Known Allergies   Current Outpatient Prescriptions  Medication Sig Dispense Refill  . calcitRIOL (ROCALTROL) 0.25 MCG capsule Take 1 capsule (0.25 mcg total) by mouth daily. (Patient taking differently: Take 0.25 mcg by mouth every other day. ) 60 capsule 0  . calcium carbonate (TUMS - DOSED IN MG ELEMENTAL CALCIUM) 500 MG chewable tablet Chew 1 tablet by mouth daily as needed for indigestion or heartburn.    Marland Kitchen epoetin alfa (EPOGEN,PROCRIT) 4000 UNIT/ML injection Inject 4,000 Units into the skin once a week. TUESDAYS    . lanthanum (FOSRENOL) 1000 MG chewable tablet Chew 1 tablet (1,000 mg total) by mouth 3 (three) times daily with meals. 90 tablet 0  . multivitamin (RENA-VIT) TABS tablet Take 1 tablet by mouth at bedtime. 30 tablet 0  . UNABLE TO FIND This note is to certify that Dylan [name] is currently with her father Dylan [name] who is currently hospitalized at [facility] in [place], [place], [place] and hence she is unable to travel outside of the country 1 each 0   No current facility-administered medications for this visit.      Past Surgical History:  Procedure Laterality Date  . BASCILIC VEIN TRANSPOSITION Left 04/06/2016   Procedure: Left Arm Single Stage Bascilic Vein Transposition;  Surgeon: Dylan Sandy, MD;  Location: [facility];  Service: Vascular;  Laterality: Left;  . INSERTION OF DIALYSIS CATHETER Right 04/06/2016   Procedure: INSERTION OF Tunneled DIALYSIS CATHETER RIGHT INTERNAL JUGULAR;  Surgeon: Dylan Sandy, MD;  Location: [facility] OR;  Service: Vascular;  Laterality: Right;  . IR GENERIC HISTORICAL  06/26/2016   IR REMOVAL TUN CV CATH W/O FL 06/26/2016 Dylan Cleveland, MD [facility]-INTER[facility] RAD     No Known Allergies    No family history on  file.   Social History Dylan [name] reports that he has never smoked. He has never used smokeless tobacco. Dylan [name] reports that he does not drink alcohol.   Review of Systems CONSTITUTIONAL: No weight loss, fever, chills, weakness or fatigue.  HEENT: Eyes: No visual loss, blurred vision, double vision or yellow sclerae.No hearing loss, sneezing, congestion, runny nose or sore throat.  SKIN: No rash or itching.  CARDIOVASCULAR:  RESPIRATORY: No shortness of breath, cough or sputum.  GASTROINTESTINAL: No anorexia, nausea, vomiting or diarrhea. No abdominal pain or blood.  GENITOURINARY: No burning on urination, no polyuria NEUROLOGICAL: No headache, dizziness, syncope, paralysis, ataxia, numbness or tingling in the extremities. No change in bowel or bladder control.  MUSCULOSKELETAL: No muscle, back pain, joint pain or stiffness.  LYMPHATICS: No enlarged nodes. No history of splenectomy.  PSYCHIATRIC: No history of depression or anxiety.  ENDOCRINOLOGIC: No reports of sweating, cold or heat intolerance. No polyuria or polydipsia.  Marland Kitchen   Physical Examination There were no vitals filed for this visit. There were no vitals filed for this visit.  Gen: resting comfortably, no acute distress HEENT: no scleral icterus, pupils equal round and reactive, no palptable cervical adenopathy,  CV Resp: Clear to auscultation bilaterally GI: abdomen is soft, non-tender, non-distended, normal bowel sounds, no hepatosplenomegaly MSK: extremities are warm, no edema.  Skin: warm, no rash Neuro:  no focal deficits Psych: appropriate affect   Diagnostic Studies  Assessment and Plan        Arnoldo Lenis, M.D., F.A.C.C.

## 2017-10-22 IMAGING — CT CT HEAD W/O CM
3 series · 15 of 47 positions shown, 18 images · non-contrast
Comparison: None.

CLINICAL DATA: 55-year-old male with progressive chronic renal
disease and hyperkalemia. Seizures. Initial encounter.

EXAM:
CT HEAD WITHOUT CONTRAST
TECHNIQUE: Contiguous axial images were obtained from the base of the skull
through the vertex without intravenous contrast.

[Series 2: head 5.0 h30s · axial · 0.46mm/px · z∈[-159,-19]mm · 9 of 34 slices shown, 12 images]
[im 3/34  brain]
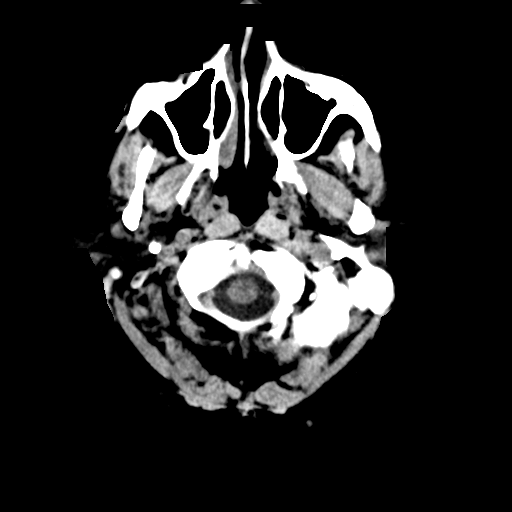
[im 3/34  bone]
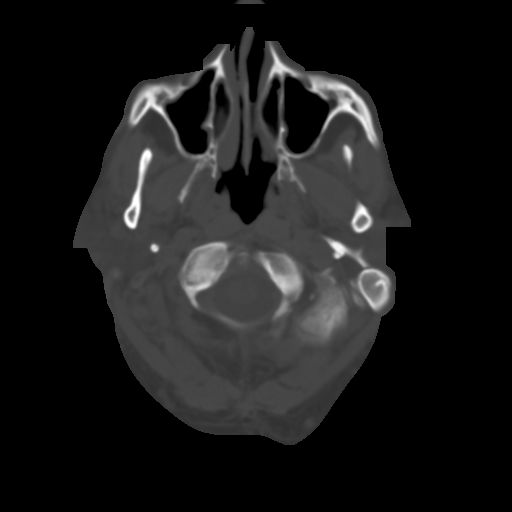
[im 6/34  brain]
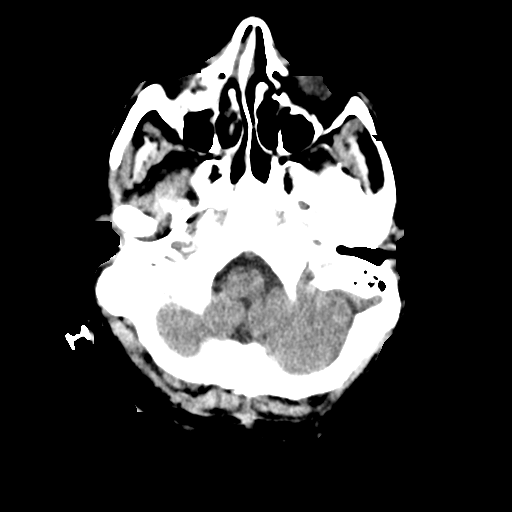
[im 10/34  brain]
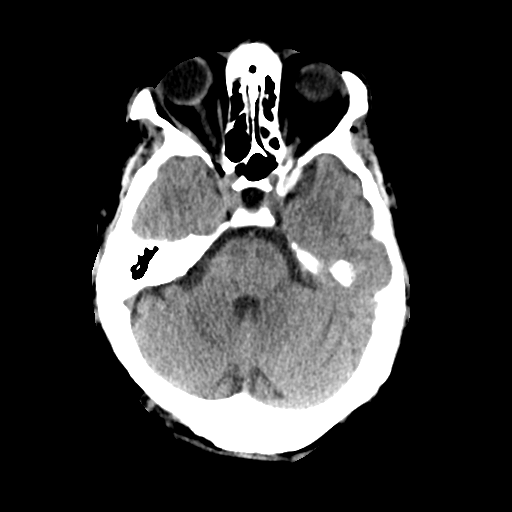
[im 13/34  brain]
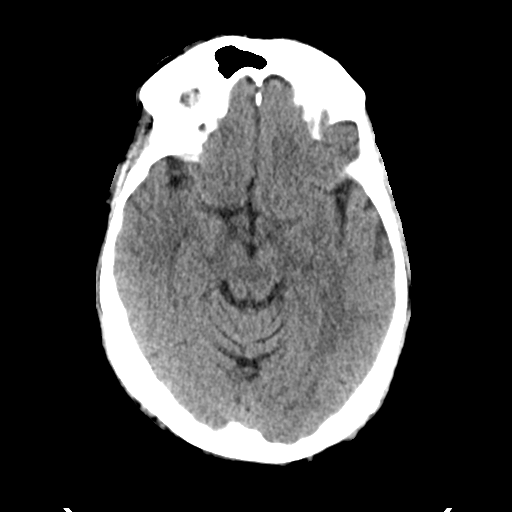
[im 18/34  brain]
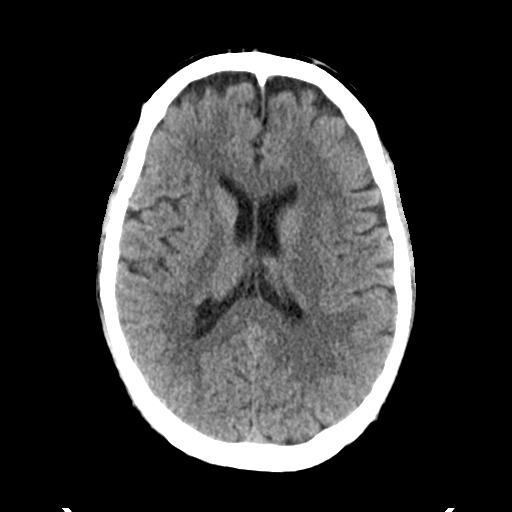
[im 18/34  bone]
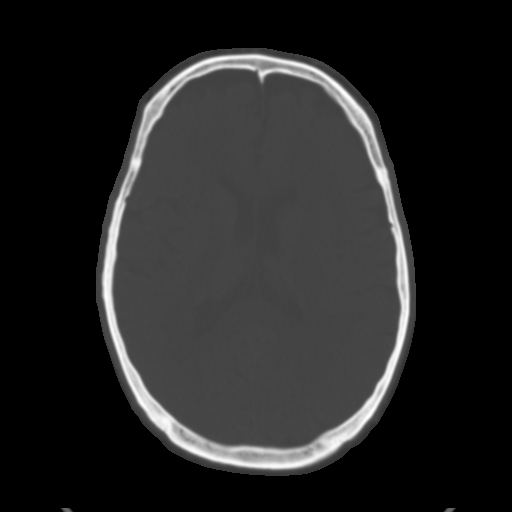
[im 21/34  brain]
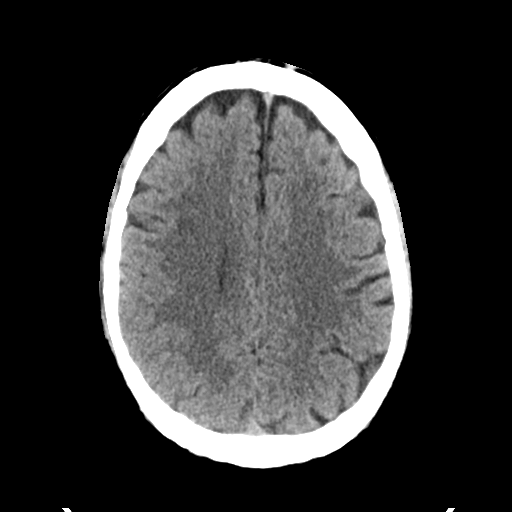
[im 24/34  brain]
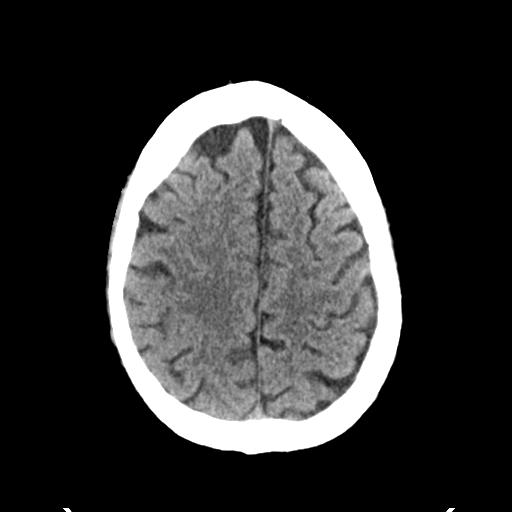
[im 28/34  brain]
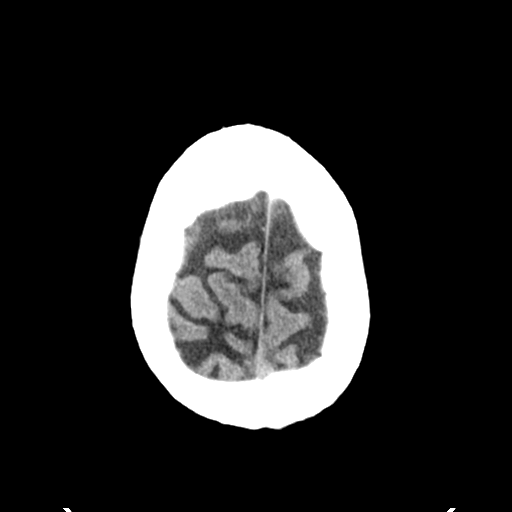
[im 31/34  brain]
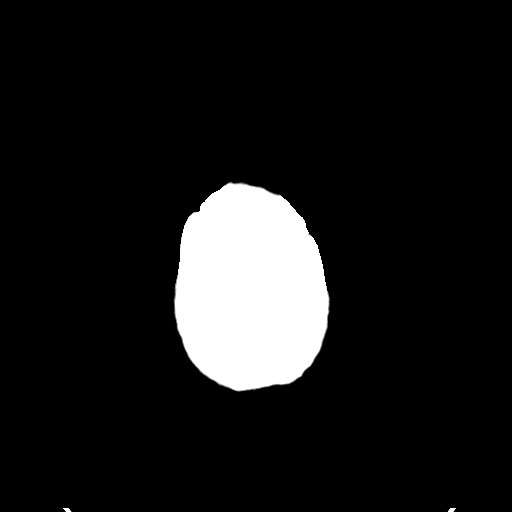
[im 31/34  bone]
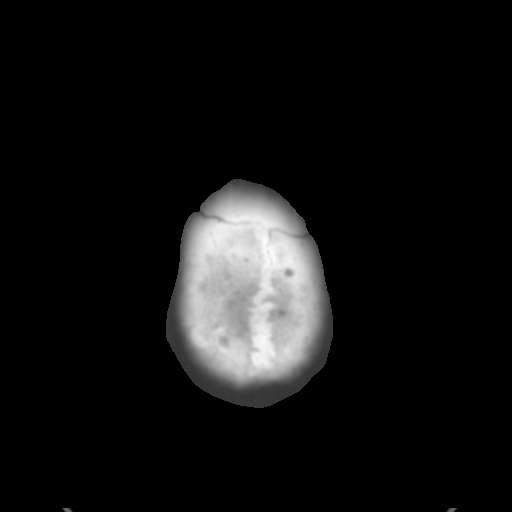

[Series 4: head 3.0 mpr cor · coronal · 0.34mm/px · 3 of 74 slices shown]
[im 25/74  brain]
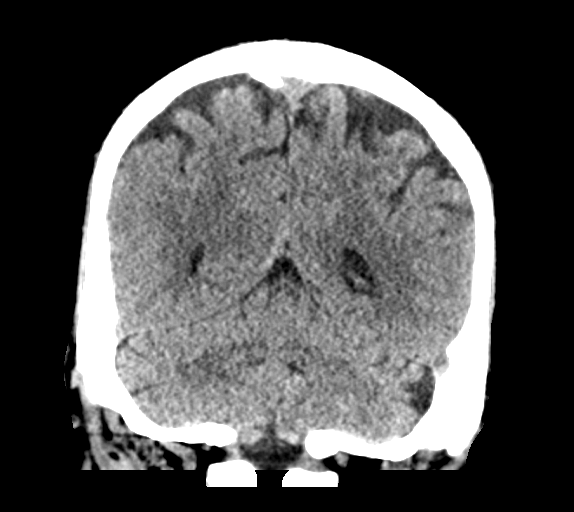
[im 33/74  brain]
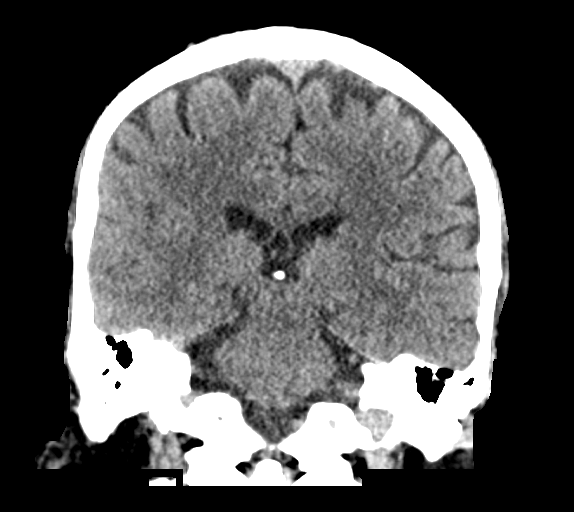
[im 41/74  brain]
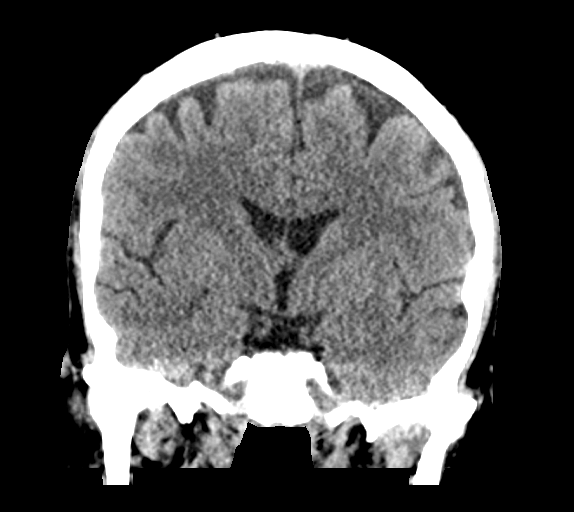

[Series 5: head 3.0 mpr sag · sagittal · 0.33mm/px · 3 of 63 slices shown]
[im 21/63  brain]
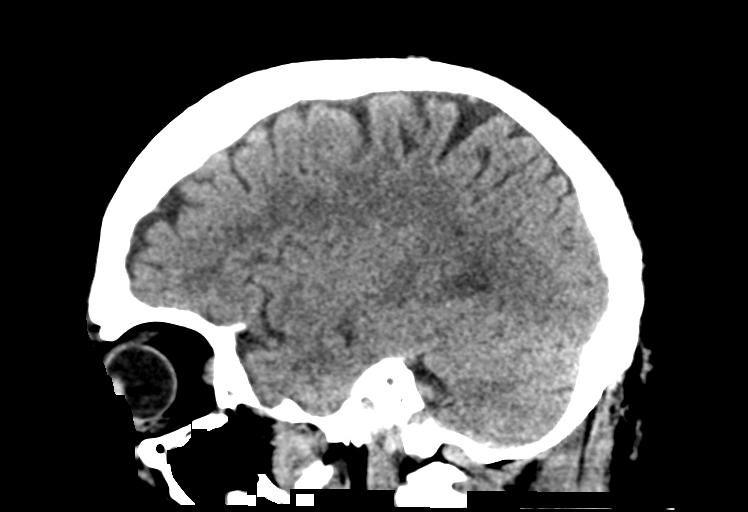
[im 32/63  brain]
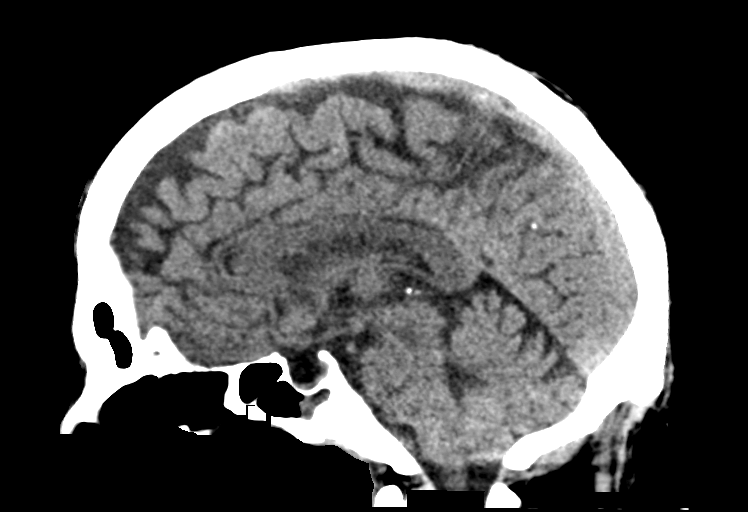
[im 42/63  brain]
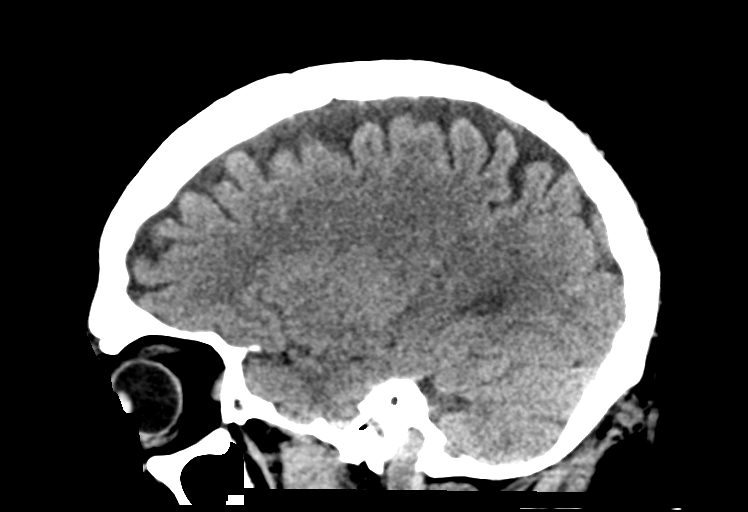

[15 of 47 positions shown; findings below may reference images not displayed]

FINDINGS: Brain: No midline shift, ventriculomegaly, mass effect, evidence of
mass lesion, intracranial hemorrhage or evidence of cortically based
acute infarction. Gray-white matter differentiation is within normal
limits throughout the brain. No cortical encephalomalacia
identified.

Vascular: No suspicious intracranial vascular hyperdensity.

Skull: No acute osseous abnormality identified.

Sinuses/Orbits: Bubbly opacity in both maxillary sinuses and the
right sphenoid sinus. Fluid level in the right sphenoid. Mild to
moderate paranasal sinus mucosal thickening elsewhere. Chronic
appearing opacification and sclerosis of the right mastoid air
cells. Left mastoids and both tympanic cavities are clear.

Other: No acute orbit or scalp soft tissue findings.
IMPRESSION: 1. Negative noncontrast CT appearance of the brain.
2. Paranasal sinus disease with some fluid levels and bubbly opacity
suggesting acute sinusitis.

## 2018-05-01 HISTORY — PX: TOOTH EXTRACTION: SUR596

## 2018-06-05 ENCOUNTER — Ambulatory Visit (INDEPENDENT_AMBULATORY_CARE_PROVIDER_SITE_OTHER): Payer: PRIVATE HEALTH INSURANCE | Admitting: Internal Medicine

## 2018-06-05 ENCOUNTER — Other Ambulatory Visit: Payer: Self-pay

## 2018-06-05 ENCOUNTER — Encounter: Payer: Self-pay | Admitting: Internal Medicine

## 2018-06-05 VITALS — BP 128/68 | HR 60 | Temp 98.4°F | Ht 66.8 in | Wt 198.2 lb

## 2018-06-05 DIAGNOSIS — N179 Acute kidney failure, unspecified: Secondary | ICD-10-CM | POA: Diagnosis not present

## 2018-06-05 DIAGNOSIS — I120 Hypertensive chronic kidney disease with stage 5 chronic kidney disease or end stage renal disease: Secondary | ICD-10-CM

## 2018-06-05 DIAGNOSIS — I10 Essential (primary) hypertension: Secondary | ICD-10-CM

## 2018-06-05 DIAGNOSIS — N186 End stage renal disease: Secondary | ICD-10-CM

## 2018-06-05 DIAGNOSIS — I129 Hypertensive chronic kidney disease with stage 1 through stage 4 chronic kidney disease, or unspecified chronic kidney disease: Secondary | ICD-10-CM

## 2018-06-05 DIAGNOSIS — E781 Pure hyperglyceridemia: Secondary | ICD-10-CM

## 2018-06-05 DIAGNOSIS — Z992 Dependence on renal dialysis: Secondary | ICD-10-CM

## 2018-06-05 NOTE — Patient Instructions (Addendum)
MUCHO GUSTO DE CONOCERLE, SI [place] A LA EXTENCION DIRECTAMENTE EN INGLES AT [address], [address].   PUSE REFERENCIA PARA VER A UN CARDIOLOGY Y ELLOS LE [place] CUANDO TU SEGURO LO [place].

## 2018-06-05 NOTE — Progress Notes (Signed)
Subjective:     Patient ID: Dylan [name] , male    DOB: [birth year] , 59 y.o.   MRN: [id number]   Chief Complaint  Patient presents with  . New Patient (Initial Visit)    renal failure/ dialysis /hypertension    HPI Dylan [name] is a 58 y/o with history of HTN and renal failure currently on dialysis. He is here  to establish care with our practice. Has had HTN since 1999. Age 58y. Has taken up to 6 meds and still could not get his BP med , plus was under a lot of stress.  2017 diagnosed ck 4 and was placed on a diet recommended for nephrology. He started getting  He was in town from     And picked up the flu and his daughter who is an MD took him to ER, and had very high potasium and started on dialysis and eventually got A-V fistula.  Has dialysis 3/week and states he feels well. When he leaves sometimes he feels a little lightheaded.  He needs to have a cardiologist due chronic HTN and had hypertrophic heart, but has not seen one since he lived in [place].  He also has aches on his L elbow and R knee.  He used to wt 130 kg before dialysis.  Colonoscopy and endoscopy- 2017, normal. Done due to diarrhea from parasites Past Medical History:  Diagnosis Date  . Hypertension   . Renal disorder      Family History  Problem Relation Age of Onset  . Cancer Mother   . Cancer Father   . Diabetes Brother      Current Outpatient Medications:  .  cinacalcet (SENSIPAR) 90 MG tablet, Take 90 mg by mouth daily., Disp: , Rfl:  .  epoetin alfa (EPOGEN,PROCRIT) 4000 UNIT/ML injection, Inject 4,000 Units into the skin once a week. TUESDAYS, Disp: , Rfl:  .  multivitamin (RENA-VIT) TABS tablet, Take 1 tablet by mouth at bedtime., Disp: 30 tablet, Rfl: 0 .  sucroferric oxyhydroxide (VELPHORO) 500 MG chewable tablet, Chew 500 mg by mouth 3 (three) times daily with meals., Disp: , Rfl:  .  calcitRIOL (ROCALTROL) 0.25 MCG capsule, Take 1 capsule (0.25 mcg total) by mouth daily. (Patient not taking: Reported  on 06/05/2018), Disp: 60 capsule, Rfl: 0 .  calcium carbonate (TUMS - DOSED IN MG ELEMENTAL CALCIUM) 500 MG chewable tablet, Chew 1 tablet by mouth daily as needed for indigestion or heartburn., Disp: , Rfl:  .  lanthanum (FOSRENOL) 1000 MG chewable tablet, Chew 1 tablet (1,000 mg total) by mouth 3 (three) times daily with meals. (Patient not taking: Reported on 06/05/2018), Disp: 90 tablet, Rfl: 0 .  UNABLE TO FIND, This note is to certify that Ms.Rut Siegman is currently with her father Mr.Jalen Cordner who is currently hospitalized at [facility] in [place], [place], [place] and hence she is unable to travel outside of the country (Patient not taking: Reported on 06/05/2018), Disp: 1 each, Rfl: 0   No Known Allergies   Review of Systems  Constitutional: Negative for diaphoresis.  Respiratory: Negative for chest tightness and shortness of breath.   Cardiovascular: Negative for chest pain, palpitations and leg swelling.  Gastrointestinal: Negative for abdominal pain, constipation, diarrhea, nausea and vomiting.  Genitourinary: Positive for difficulty urinating.       Does not urinate much since dialysis  Musculoskeletal: Negative for gait problem.  Skin: Negative for rash.  Neurological: Negative for weakness and headaches.  Hematological: Negative for adenopathy.  Psychiatric/Behavioral: Negative for sleep disturbance.     Today's Vitals   06/05/18 0905  BP: 128/68  Pulse: 60  Temp: 98.4 F (36.9 C)  TempSrc: Oral  SpO2: 97%  Weight: 198 lb 3.2 oz (89.9 kg)  Height: 5' 6.8" (1.697 m)   Body mass index is 31.23 kg/m.   Objective:  Physical Exam  He is here with his daughter who is a physician  And with his wife. Constitutional: He is oriented to person, place, and time. He appears well-developed and well-nourished. No distress.  HENT:  Head: Normocephalic and atraumatic.  Right Ear: External ear normal.  Left Ear: External ear normal.  Nose: Nose normal.  Eyes:  Conjunctivae are normal. Right eye exhibits no discharge. Left eye exhibits no discharge. No scleral icterus.  Neck: Neck supple. No thyromegaly present.  No carotid bruits bilaterally  Cardiovascular: Normal rate and regular rhythm.  No murmur heard. Pulmonary/Chest: Effort normal and breath sounds normal. No respiratory distress.  Musculoskeletal: Normal range of motion. She exhibits no edema.  Lymphadenopathy:    She has no cervical adenopathy.  Neurological: She is alert and oriented to person, place, and time.  Skin: Skin is warm and dry. Capillary refill takes less than 2 seconds. No rash noted. She is not diaphoretic.  Psychiatric: She has a normal mood and affect. Her behavior is normal. Judgment and thought content normal.  Nursing note reviewed.    Assessment And Plan:    1. Essential hypertension- stable. May continue same medications.  - Lipid Profile - CMP14 + Anion Gap - CBC no Diff - Ambulatory referral to Cardiology  2. Hypertriglyceridemia- unknown status since this has not been checking in > 2 y - Lipid Profile - TSH - T4, Free - T3, free - Ambulatory referral to Cardiology 3- Renal failure, on dialysis- chronic. No action. Will continue care with nephrologist.   FU for complete physical.     Afiya Ferrebee RODRIGUEZ-SOUTHWORTH, PA-C

## 2018-06-06 LAB — CMP14 + ANION GAP
ALT: 13 IU/L (ref 0–44)
AST: 16 IU/L (ref 0–40)
Albumin/Globulin Ratio: 1.8 (ref 1.2–2.2)
Albumin: 4.9 g/dL (ref 3.8–4.9)
Alkaline Phosphatase: 65 IU/L (ref 39–117)
Anion Gap: 22 mmol/L — ABNORMAL HIGH (ref 10.0–18.0)
BUN/Creatinine Ratio: 7 — ABNORMAL LOW (ref 9–20)
BUN: 68 mg/dL — ABNORMAL HIGH (ref 6–24)
Bilirubin Total: 0.4 mg/dL (ref 0.0–1.2)
CO2: 26 mmol/L (ref 20–29)
Calcium: 10.3 mg/dL — ABNORMAL HIGH (ref 8.7–10.2)
Chloride: 93 mmol/L — ABNORMAL LOW (ref 96–106)
Creatinine, Ser: 9.27 mg/dL — ABNORMAL HIGH (ref 0.76–1.27)
GFR calc Af Amer: 6 mL/min/{1.73_m2} — ABNORMAL LOW (ref >59)
GFR calc non Af Amer: 6 mL/min/{1.73_m2} — ABNORMAL LOW (ref >59)
Globulin, Total: 2.7 g/dL (ref 1.5–4.5)
Glucose: 87 mg/dL (ref 65–99)
Potassium: 6 mmol/L — ABNORMAL HIGH (ref 3.5–5.2)
Sodium: 141 mmol/L (ref 134–144)
Total Protein: 7.6 g/dL (ref 6.0–8.5)

## 2018-06-06 LAB — CBC
Hematocrit: 32.8 % — ABNORMAL LOW (ref 37.5–51.0)
Hemoglobin: 11 g/dL — ABNORMAL LOW (ref 13.0–17.7)
MCH: 31.3 pg (ref 26.6–33.0)
MCHC: 33.5 g/dL (ref 31.5–35.7)
MCV: 93 fL (ref 79–97)
Platelets: 317 10*3/uL (ref 150–450)
RBC: 3.51 x10E6/uL — ABNORMAL LOW (ref 4.14–5.80)
RDW: 14.5 % (ref 11.6–15.4)
WBC: 9.8 10*3/uL (ref 3.4–10.8)

## 2018-06-06 LAB — LIPID PANEL
Chol/HDL Ratio: 4.3 ratio (ref 0.0–5.0)
Cholesterol, Total: 189 mg/dL (ref 100–199)
HDL: 44 mg/dL (ref >39)
LDL Calculated: 114 mg/dL — ABNORMAL HIGH (ref 0–99)
Triglycerides: 155 mg/dL — ABNORMAL HIGH (ref 0–149)
VLDL Cholesterol Cal: 31 mg/dL (ref 5–40)

## 2018-06-06 LAB — T3, FREE: T3, Free: 3 pg/mL (ref 2.0–4.4)

## 2018-06-06 LAB — TSH: TSH: 1.51 u[IU]/mL (ref 0.450–4.500)

## 2018-06-06 LAB — T4, FREE: Free T4: 1.21 ng/dL (ref 0.82–1.77)

## 2018-06-11 DIAGNOSIS — I129 Hypertensive chronic kidney disease with stage 1 through stage 4 chronic kidney disease, or unspecified chronic kidney disease: Secondary | ICD-10-CM | POA: Insufficient documentation

## 2018-06-11 HISTORY — DX: Hypertensive chronic kidney disease with stage 1 through stage 4 chronic kidney disease, or unspecified chronic kidney disease: I12.9

## 2018-07-03 ENCOUNTER — Other Ambulatory Visit: Payer: Self-pay

## 2018-07-03 ENCOUNTER — Ambulatory Visit (INDEPENDENT_AMBULATORY_CARE_PROVIDER_SITE_OTHER): Payer: PRIVATE HEALTH INSURANCE | Admitting: Internal Medicine

## 2018-07-03 ENCOUNTER — Encounter: Payer: Self-pay | Admitting: Internal Medicine

## 2018-07-03 VITALS — BP 130/64 | HR 64 | Temp 98.3°F | Ht 66.6 in | Wt 196.0 lb

## 2018-07-03 DIAGNOSIS — M25572 Pain in left ankle and joints of left foot: Secondary | ICD-10-CM

## 2018-07-03 DIAGNOSIS — Z125 Encounter for screening for malignant neoplasm of prostate: Secondary | ICD-10-CM

## 2018-07-03 DIAGNOSIS — Z1211 Encounter for screening for malignant neoplasm of colon: Secondary | ICD-10-CM

## 2018-07-03 DIAGNOSIS — I120 Hypertensive chronic kidney disease with stage 5 chronic kidney disease or end stage renal disease: Secondary | ICD-10-CM | POA: Diagnosis not present

## 2018-07-03 DIAGNOSIS — M25561 Pain in right knee: Secondary | ICD-10-CM

## 2018-07-03 DIAGNOSIS — M25562 Pain in left knee: Secondary | ICD-10-CM

## 2018-07-03 DIAGNOSIS — N186 End stage renal disease: Secondary | ICD-10-CM

## 2018-07-03 DIAGNOSIS — Z0000 Encounter for general adult medical examination without abnormal findings: Secondary | ICD-10-CM

## 2018-07-03 DIAGNOSIS — G8929 Other chronic pain: Secondary | ICD-10-CM

## 2018-07-03 DIAGNOSIS — Z992 Dependence on renal dialysis: Secondary | ICD-10-CM

## 2018-07-03 DIAGNOSIS — I10 Essential (primary) hypertension: Secondary | ICD-10-CM

## 2018-07-03 DIAGNOSIS — R81 Glycosuria: Secondary | ICD-10-CM

## 2018-07-03 LAB — POCT UA - MICROALBUMIN
Albumin/Creatinine Ratio, Urine, POC: >300
Creatinine, POC: 50 mg/dL
Microalbumin Ur, POC: 150 mg/L

## 2018-07-03 LAB — POCT URINALYSIS DIPSTICK
Bilirubin, UA: NEGATIVE
Glucose, UA: POSITIVE — AB
Ketones, UA: NEGATIVE
Leukocytes, UA: NEGATIVE
Nitrite, UA: NEGATIVE
Protein, UA: POSITIVE — AB
Spec Grav, UA: 1.015 (ref 1.010–1.025)
Urobilinogen, UA: 0.2 E.U./dL
pH, UA: 8.5 — AB (ref 5.0–8.0)

## 2018-07-03 NOTE — Patient Instructions (Signed)
Cuidados preventivos en los hombres de 40 a 64 aos de edad  Preventive Care 40-64 Years, Male  Los cuidados preventivos hacen referencia a las opciones en cuanto al estilo de vida y a las visitas al mdico, las cuales pueden promover la salud y el bienestar.  Qu incluyen los cuidados preventivos?     Un examen fsico anual. Esto tambin se conoce como control de bienestar anual.   Exmenes dentales una o dos veces al ao.   Exmenes de la vista de rutina. Pregntele al mdico con qu frecuencia debe realizarse un control de la vista.   Opciones personales de estilo de vida, que incluyen lo siguiente:  ? Cuidarse los dientes y las encas a diario.  ? Realizar actividad fsica con regularidad.  ? Seguir una dieta saludable.  ? Evitar el consumo de tabaco y drogas.  ? Limitar el consumo de bebidas alcohlicas.  ? Practicar el sexo seguro.  ? Tomar una dosis baja de aspirina todos los das a partir de los 50 aos de edad.  Qu sucede durante un control de bienestar anual?  Los servicios y exmenes de deteccin realizados por su mdico durante el control de bienestar anual dependern de su salud general, sus factores de riesgo de estilo de vida y sus antecedentes familiares de enfermedades.  Asesoramiento  Su mdico puede preguntarle acerca de:   Su consumo de alcohol.   Su consumo de tabaco.   Su consumo de drogas.   Su bienestar emocional.   El bienestar en el hogar y sus relaciones personales.   Su actividad sexual.   Sus hbitos de alimentacin.   Su trabajo y ambiente laboral.  Pruebas de deteccin  Pueden hacerle las siguientes pruebas o mediciones:   Estatura, peso e ndice de masa muscular (IMC).   Presin arterial.   Niveles de lpidos y colesterol. Estos se pueden verificar cada 5 aos o, con ms frecuencia, si usted tiene ms de 50 aos de edad.   Control de la piel.   Pruebas de deteccin de cncer de pulmn. Es posible que se le realice esta prueba de deteccin a partir de los 55 aos de  edad, si ha fumado durante 30 aos un paquete diario y sigue fumando o dej el hbito en algn momento en los ltimos 15 aos.   Pruebas de deteccin de cncer colorrectal. Todos los adultos a partir de los 50 aos de edad y hasta los 75 aos de edad deben hacerse esta prueba de deteccin. El mdico puede recomendarle las pruebas de deteccin a partir de los 45 aos de edad. Le realizarn pruebas cada 1 a 10 aos, segn los resultados y el tipo de prueba de deteccin. Las personas que tienen un mayor riesgo deben comenzar con las pruebas de deteccin a una edad ms temprana. Las pruebas de deteccin pueden incluir:  ? Prueba de sangre oculta en materia fecal con guayacol.  ? Prueba inmunoqumica fecal (PIF).  ? Prueba de cido desoxirribonucleico (ADN) en heces.  ? Colonoscopa virtual.  ? Sigmoidoscopa. Durante esta prueba, se utiliza un tubo flexible con una cmara diminuta (sigmoidoscopio) para examinar el recto y la parte inferior del colon. El sigmoidoscopio se inserta a travs del ano en el recto y la parte inferior del colon.  ? Colonoscopa. Durante esta prueba, se utiliza un tubo largo, delgado y flexible con una cmara diminuta (colonoscopio) para examinar todo el colon y el recto.   Examen de deteccin del cncer de prstata. Las recomendaciones variarn segn   sus antecedentes familiares y otros riesgos.   Anlisis de sangre para la deteccin de la hepatitis C.   Anlisis de sangre para la deteccin de la hepatitis B.   Anlisis de enfermedades de transmisin sexual (ETS).   Pruebas de deteccin de la diabetes. Esto se realiza mediante un control del azcar en la sangre (glucosa) despus de no haber comido durante un periodo de tiempo (ayuno). Es posible que se le realice esta prueba cada 1 a 3 aos.  Hable con su mdico para analizar los resultados, las opciones de tratamiento y, si corresponde, la necesidad de realizar ms pruebas.  Vacunas  El mdico puede recomendarle que se aplique algunas  vacunas, por ejemplo:   Vacuna contra la gripe. Se recomienda aplicarse esta vacuna todos los aos.   Vacuna contra la difteria, ttanos y tos ferina acelular (DTPa, DT). Es posible que tenga que aplicarse un refuerzo contra el ttanos y la difteria (DT) cada 10aos.   Vacuna contra la varicela. Es posible que tenga que aplicrsela si no recibi esta vacuna.   Vacuna contra el herpes zster. Es posible que la necesite despus de los 60 aos de edad.   Vacuna contra el sarampin, rubola y paperas (SRP). Es posible que necesite aplicarse al menos una dosis de la vacuna SRP si naci despus de 1957. Podra tambin necesitar una segunda dosis.   Vacuna antineumoccica conjugada 13 valente (PCV13). Puede necesitar esta vacuna si tiene determinadas enfermedades y no se vacun anteriormente.   Vacuna antineumoccica de polisacridos (PPSV23). Quizs tenga que aplicarse una o dos dosis si fuma o si tiene determinadas afecciones.   Vacuna antimeningoccica. Puede necesitar esta vacuna si tiene determinadas afecciones.   Vacuna contra la hepatitis A. Es posible que necesite esta vacuna si tiene ciertas afecciones o si viaja o trabaja en lugares en los que podra estar expuesto a la hepatitis A.   Vacuna contra la hepatitis B. Es posible que necesite esta vacuna si tiene ciertas afecciones o si viaja o trabaja en lugares en los que podra estar expuesto a la hepatitis B.   Vacuna contra la Haemophilus influenzae de tipob (Hib). Es posible que necesite esta vacuna si tiene determinados factores de riesgo.  Hable con el mdico sobre qu pruebas de deteccin y qu vacunas necesita, y con qu frecuencia las necesita.  Esta informacin no tiene como fin reemplazar el consejo del mdico. Asegrese de hacerle al mdico cualquier pregunta que tenga.  Document Released: 08/29/2016 Document Revised: 07/30/2017 Document Reviewed: 02/16/2015  Elsevier Interactive Patient Education  2019 Elsevier Inc.

## 2018-07-03 NOTE — Progress Notes (Signed)
Subjective:     Patient ID: Dylan [name] , male    DOB: 09/18/1960 , 58 y.o.   MRN: [id number]   Chief Complaint  Patient presents with  . Annual Exam    no concerns with anything but his kidneys    HPI Pt is here for yearly physical. Has been doing well, and continues with his dialysis with no problems. Denies complaints.  Last colonoscopy 3 years ago and was normal, but [place]. Per his wife he complains of bilateral knee pain and L ankle pain after he comes back from work, but he does not seem to be concerned about it today. Has no pain right now. He walks a lot at work, and on days with good weather walks outside up to 3 times a week.     Past Medical History:  Diagnosis Date  . Hypertension   . Renal disorder      Family History  Problem Relation Age of Onset  . Cancer Mother   . Breast cancer Mother   . Prostate cancer Father   . Diabetes Brother      Current Outpatient Medications:  .  calcium carbonate (TUMS - DOSED IN MG ELEMENTAL CALCIUM) 500 MG chewable tablet, Chew 1 tablet by mouth daily as needed for indigestion or heartburn., Disp: , Rfl:  .  cinacalcet (SENSIPAR) 90 MG tablet, Take 90 mg by mouth daily., Disp: , Rfl:  .  epoetin alfa (EPOGEN,PROCRIT) 4000 UNIT/ML injection, Inject 4,000 Units into the skin once a week. TUESDAYS, Disp: , Rfl:  .  multivitamin (RENA-VIT) TABS tablet, Take 1 tablet by mouth at bedtime., Disp: 30 tablet, Rfl: 0 .  sucroferric oxyhydroxide (VELPHORO) 500 MG chewable tablet, Chew 500 mg by mouth 3 (three) times daily with meals., Disp: , Rfl:  .  calcitRIOL (ROCALTROL) 0.25 MCG capsule, Take 1 capsule (0.25 mcg total) by mouth daily. (Patient not taking: Reported on 06/05/2018), Disp: 60 capsule, Rfl: 0 .  lanthanum (FOSRENOL) 1000 MG chewable tablet, Chew 1 tablet (1,000 mg total) by mouth 3 (three) times daily with meals. (Patient not taking: Reported on 06/05/2018), Disp: 90 tablet, Rfl: 0 .  UNABLE TO FIND, This note  is to certify that Ms.Rut Chancellor is currently with her father Mr.Bingham Smolinsky who is currently hospitalized at [facility] in [place], [place], [place] and hence she is unable to travel outside of the country (Patient not taking: Reported on 06/05/2018), Disp: 1 each, Rfl: 0   No Known Allergies   Review of Systems  Endocrine: Negative for polydipsia and polyphagia.  Genitourinary: Positive for decreased urine volume.  Musculoskeletal: Positive for arthralgias. Negative for gait problem, joint swelling and myalgias.   The rest is negative.   Today's Vitals   07/03/18 0901  BP: 130/64  Pulse: 64  Temp: 98.3 F (36.8 C)  TempSrc: Oral  SpO2: 98%  Weight: 196 lb (88.9 kg)  Height: 5' 6.6" (1.692 m)   Body mass index is 31.07 kg/m.   Objective:  Physical Exam    BP 130/64 (BP Location: Right Arm, Patient Position: Sitting, Cuff Size: Normal)   Pulse 64   Temp 98.3 F (36.8 C) (Oral)   Ht 5' 6.6" (1.692 m)   Wt 196 lb (88.9 kg)   SpO2 98%   BMI 31.07 kg/m   General Appearance:    Alert, cooperative, no distress, appears stated age  Head:    Normocephalic, without obvious abnormality, atraumatic  Eyes:  PERRL, conjunctiva/corneas clear, EOM's intact, fundi    benign, both eyes       Ears:    Normal TM's and external ear canals, both ears  Nose:   Nares normal, septum midline, mucosa normal, no drainage   or sinus tenderness  Throat:   Lips, mucosa, and tongue normal; teeth and gums normal  Neck:   Supple, symmetrical, trachea midline, no adenopathy;       thyroid:  No enlargement/tenderness/nodules; no carotid   bruit  Back:     Symmetric, no curvature, ROM normal, no CVA tenderness  Lungs:     Clear to auscultation bilaterally, respirations unlabored  Chest wall:    No tenderness or deformity  Heart:    Regular rate and rhythm, S1 and S2 normal, no murmur, rub   or gallop  Abdomen:     Soft, non-tender, bowel sounds active all four quadrants,    no masses, no  organomegaly  Genitalia:    Normal male without lesion, discharge or tenderness  Rectal:    Normal tone, normal prostate, no masses or tenderness;   guaiac negative stool  Extremities:   Extremities normal, atraumatic, no cyanosis or edema. Has mild crepitations of both knees, but no pain with palpation on them or L ankle.   Pulses:   2+ and symmetric all extremities  Skin:   Skin color, texture, turgor normal, no rashes or lesions  Lymph nodes:   Cervical, supraclavicular, and axillary nodes normal  Neurologic:   CNII-XII intact. Normal strength, sensation and reflexes      throughout  EKG- badycardia, otherwise WNL Assessment And Plan:  1. Screen for colon cancer- screen. GI referral done.   2. Routine medical exam- routine. FU 1 y - POCT Urinalysis Dipstick (81002)- +1 glucose - POCT UA - Microalbumin  3. Chronic pain of left ankle- chronic. No pain right now. No action.  4. Bilateral chronic knee pain- chronic. No symptoms right now.   He may have mild OA. No action.   5. Screening for prostate cancer-screen   - PSA  6. CKD (chronic kidney disease) requiring chronic dialysis ([facility])- chronic. Will continue FU with nephrologist and dialysis as scheduled.  - POCT Urinalysis Dipstick (81002) - POCT UA - Microalbumin  7. Essential hypertension- stable. May continue current meds. FU 3 months.  - EKG 12-Lead- brady, otherwise nl.  - POCT UA - Microalbumin  8. Glucosuria- new. Has no past hx of DM.  - Hemoglobin A1c    Adalbert Alberto RODRIGUEZ-SOUTHWORTH, PA-C

## 2018-07-04 DIAGNOSIS — M25561 Pain in right knee: Secondary | ICD-10-CM

## 2018-07-04 DIAGNOSIS — R81 Glycosuria: Secondary | ICD-10-CM | POA: Insufficient documentation

## 2018-07-04 DIAGNOSIS — M25562 Pain in left knee: Secondary | ICD-10-CM

## 2018-07-04 DIAGNOSIS — G8929 Other chronic pain: Secondary | ICD-10-CM | POA: Insufficient documentation

## 2018-07-04 DIAGNOSIS — M25572 Pain in left ankle and joints of left foot: Secondary | ICD-10-CM

## 2018-07-04 HISTORY — DX: Pain in right knee: M25.561

## 2018-07-04 HISTORY — DX: Pain in left ankle and joints of left foot: M25.572

## 2018-07-04 HISTORY — DX: Glycosuria: R81

## 2018-07-04 HISTORY — DX: Other chronic pain: G89.29

## 2018-07-04 LAB — PSA: Prostate Specific Ag, Serum: 3.1 ng/mL (ref 0.0–4.0)

## 2018-07-04 LAB — POC HEMOCCULT BLD/STL (OFFICE/1-CARD/DIAGNOSTIC): Fecal Occult Blood, POC: NEGATIVE

## 2018-07-05 ENCOUNTER — Other Ambulatory Visit: Payer: PRIVATE HEALTH INSURANCE

## 2018-07-06 LAB — HEMOGLOBIN A1C
Est. average glucose Bld gHb Est-mCnc: 88 mg/dL
Hgb A1c MFr Bld: 4.7 % — ABNORMAL LOW (ref 4.8–5.6)

## 2018-07-16 ENCOUNTER — Other Ambulatory Visit (HOSPITAL_COMMUNITY): Payer: Self-pay | Admitting: Nephrology

## 2018-07-16 DIAGNOSIS — R7611 Nonspecific reaction to tuberculin skin test without active tuberculosis: Secondary | ICD-10-CM

## 2018-07-17 ENCOUNTER — Ambulatory Visit (HOSPITAL_COMMUNITY)
Admission: RE | Admit: 2018-07-17 | Discharge: 2018-07-17 | Disposition: A | Discharge status: Home / Self Care | Payer: PRIVATE HEALTH INSURANCE | Source: Ambulatory Visit | Attending: Nephrology | Admitting: Nephrology

## 2018-07-17 ENCOUNTER — Other Ambulatory Visit: Payer: Self-pay

## 2018-07-17 DIAGNOSIS — R7611 Nonspecific reaction to tuberculin skin test without active tuberculosis: Secondary | ICD-10-CM | POA: Diagnosis present

## 2018-08-06 ENCOUNTER — Telehealth: Payer: Self-pay

## 2018-08-06 NOTE — Telephone Encounter (Signed)
I tried calling the Pt with no success so I left him a voicemail to call back the office number to reschedule his appointment.

## 2018-08-16 ENCOUNTER — Ambulatory Visit: Payer: Self-pay | Admitting: Cardiology

## 2018-08-25 ENCOUNTER — Telehealth: Payer: Self-pay | Admitting: Internal Medicine

## 2018-08-25 NOTE — Telephone Encounter (Signed)
I called to check on pt, but voice mail picked up and I left her my  message I was checking on her and to save my cell # in case she needed me.

## 2018-08-28 DIAGNOSIS — I770 Arteriovenous fistula, acquired: Secondary | ICD-10-CM | POA: Insufficient documentation

## 2018-08-28 DIAGNOSIS — D631 Anemia in chronic kidney disease: Secondary | ICD-10-CM | POA: Insufficient documentation

## 2018-08-28 DIAGNOSIS — E872 Acidosis, unspecified: Secondary | ICD-10-CM | POA: Insufficient documentation

## 2018-08-28 DIAGNOSIS — Z4931 Encounter for adequacy testing for hemodialysis: Secondary | ICD-10-CM | POA: Insufficient documentation

## 2018-08-28 HISTORY — DX: Acidosis, unspecified: E87.20

## 2018-10-03 ENCOUNTER — Ambulatory Visit: Payer: PRIVATE HEALTH INSURANCE | Admitting: Internal Medicine

## 2018-10-09 ENCOUNTER — Ambulatory Visit: Payer: PRIVATE HEALTH INSURANCE | Admitting: Internal Medicine

## 2018-10-27 ENCOUNTER — Telehealth: Payer: Self-pay | Admitting: Internal Medicine

## 2018-10-27 MED ORDER — ACYCLOVIR 5 % EX OINT: TOPICAL_OINTMENT | CUTANEOUS | 2 refills | Status: DC

## 2018-10-27 NOTE — Telephone Encounter (Signed)
Pt developed a recurrent rash due to increased stress at work and when he lived in [place] his PCP prescribed Acyclovir. His wife texted me a picture of the rash ,and looks vesicular like a herpetic rash. I sent Acyclovir ointment to his pharmacy

## 2018-12-26 NOTE — Progress Notes (Signed)
Cardiology Office Note   Date:  12/27/2018   ID:  Karanvir, Balderston 1960/05/09, MRN [id number]  PCP:  Minus Breeding, MD  Cardiologist:   No primary care provider on file. Referring:  Minus Breeding, MD  Chief Complaint  Patient presents with  . Hypertension  . LVH      History of Present Illness: Dylan [name] is a 58 y.o. male who presents for evaluation of difficult to control hypertension.  He had hypertension for years and not well controlled.  He was living in [place].  His daughter says that they did an extensive work-up and could never find a secondary etiology.  He has a significant family history of hypertension and he also blames a lot of his blood pressure on the politics of [place].  He eventually developed hypertensive urgency and renal failure.  He is actually on home dialysis.  His blood pressure came down when he started dialysis.  Started creeping up recently and he is been started on Norvasc.  His daughter also reports that he had LVH on echocardiography.  He does relatively well apparently.  His blood pressure sometimes runs low but he takes it on himself not to take his Norvasc if his systolics are around 277.  He might get lightheaded and even have syncope if he goes too low.  He still works full-time.  He denies any cardiovascular symptoms such as chest pressure, neck or arm discomfort.  He has no new shortness of breath, PND or orthopnea.  He has had no weight gain or edema.  Past Medical History:  Diagnosis Date  . Bilateral chronic knee pain 07/04/2018  . Chronic pain of left ankle 07/04/2018  . ESRD (end stage renal disease) ([place]) 03/29/2016  . Glucosuria 07/04/2018  . History of anemia due to CKD 03/29/2016  . Hyperkalemia, diminished renal excretion 03/29/2016  . Hypertension   . Hypertensive nephropathy 06/11/2018  . 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 Renal disorder   . Uremia of renal origin 03/29/2016     Past Surgical History:  Procedure Laterality Date  . BASCILIC VEIN TRANSPOSITION Left 04/06/2016   Procedure: Left Arm Single Stage Bascilic Vein Transposition;  Surgeon: Waynetta Sandy, MD;  Location: [facility];  Service: Vascular;  Laterality: Left;  . INSERTION OF DIALYSIS CATHETER Right 04/06/2016   Procedure: INSERTION OF Tunneled DIALYSIS CATHETER RIGHT INTERNAL JUGULAR;  Surgeon: Waynetta Sandy, MD;  Location: [facility];  Service: Vascular;  Laterality: Right;  . IR GENERIC HISTORICAL  06/26/2016   IR REMOVAL TUN CV CATH W/O FL 06/26/2016 Arne Cleveland, MD [facility]  . TOOTH EXTRACTION  2020     Current Outpatient Medications  Medication Sig Dispense Refill  . amLODipine (NORVASC) 5 MG tablet Take 5 mg by mouth daily.    Marland Kitchen CALCITRIOL PO Take by mouth daily.    Marland Kitchen epoetin alfa (EPOGEN,PROCRIT) 4000 UNIT/ML injection Inject 4,000 Units into the skin once a week. TUESDAYS    . multivitamin (RENA-VIT) TABS tablet Take 1 tablet by mouth at bedtime. 30 tablet 0   No current facility-administered medications for this visit.     Allergies:   Patient has no known allergies.    Social History:  The patient  reports that he has never smoked. He has never used smokeless tobacco. He reports that he does not drink alcohol or use drugs.   Family History:  The patient's family history includes Breast cancer in his mother; Cancer in  his mother; Diabetes in his brother; Prostate cancer in his father.    ROS:  Please see the history of present illness.   Otherwise, review of systems are positive for none.   All other systems are reviewed and negative.    PHYSICAL EXAM: VS:  BP 120/70   Pulse 72   Temp (!) 97.3 F (36.3 C)   Ht 5' 6.6" (1.692 m)   Wt 197 lb 12.8 oz (89.7 kg)   BMI 31.35 kg/m  , BMI Body mass index is 31.35 kg/m. GENERAL:  Well appearing HEENT:  Pupils equal round and reactive, fundi not visualized, oral mucosa unremarkable NECK:  No jugular venous  distention, waveform within normal limits, carotid upstroke brisk and symmetric, no bruits, no thyromegaly LYMPHATICS:  No cervical, inguinal adenopathy LUNGS:  Clear to auscultation bilaterally BACK:  No CVA tenderness CHEST:  Unremarkable HEART:  PMI not displaced or sustained,S1 and S2 within normal limits, no S3, no S4, no clicks, no rubs, no murmurs ABD:  Flat, positive bowel sounds normal in frequency in pitch, no bruits, no rebound, no guarding, no midline pulsatile mass, no hepatomegaly, no splenomegaly EXT:  2 plus pulses throughout, no edema, no cyanosis no clubbing SKIN:  No rashes no nodules NEURO:  Cranial nerves II through XII grossly intact, motor grossly intact throughout PSYCH:  Cognitively intact, oriented to person place and time    EKG:  EKG is ordered today. The ekg ordered today demonstrates sinus rhythm, rate 72, axis within normal limits, intervals within normal limits, no acute ST-T wave changes.   Recent Labs: 06/05/2018: ALT 13; BUN 68; Creatinine, Ser 9.27; Hemoglobin 11.0; Platelets 317; Potassium 6.0; Sodium 141; TSH 1.510    Lipid Panel    Component Value Date/Time   CHOL 189 06/05/2018 1414   TRIG 155 (H) 06/05/2018 1414   HDL 44 06/05/2018 1414   CHOLHDL 4.3 06/05/2018 1414   LDLCALC 114 (H) 06/05/2018 1414      Wt Readings from Last 3 Encounters:  12/27/18 197 lb 12.8 oz (89.7 kg)  07/03/18 196 lb (88.9 kg)  06/05/18 198 lb 3.2 oz (89.9 kg)      Other studies Reviewed: Additional studies/ records that were reviewed today include: Labs. Review of the above records demonstrates:  Please see elsewhere in the note.     ASSESSMENT AND PLAN:  LVH: He does need follow-up echocardiography to make sure this is resolved with blood pressure control.  However, I do not think further work-up is indicated.  Further testing will be based on these results.  HTN: His blood pressure is controlled.  It sounds like he had an extensive work-up for secondary  etiology and could not be identified.  It is creeping up a little bit but I think he is taking his amlodipine quite appropriately.  I talked the the patient and his daughter about this and I think is reasonable for him not to take the Norvasc on occasion if his blood pressures running low.  No further work-up or change in therapy is suggested.   Current medicines are reviewed at length with the patient today.  The patient does not have concerns regarding medicines.  The following changes have been made:  no change  Labs/ tests ordered today include:   Orders Placed This Encounter  Procedures  . EKG 12-Lead  . ECHOCARDIOGRAM COMPLETE     Disposition:   FU with me as needed.      Signed, Minus Breeding, MD  12/27/2018  4:37 PM    [facility]

## 2018-12-27 ENCOUNTER — Ambulatory Visit (INDEPENDENT_AMBULATORY_CARE_PROVIDER_SITE_OTHER): Payer: PRIVATE HEALTH INSURANCE | Admitting: Cardiology

## 2018-12-27 ENCOUNTER — Encounter: Payer: Self-pay | Admitting: Cardiology

## 2018-12-27 ENCOUNTER — Other Ambulatory Visit: Payer: Self-pay

## 2018-12-27 VITALS — BP 120/70 | HR 72 | Temp 97.3°F | Ht 66.6 in | Wt 197.8 lb

## 2018-12-27 DIAGNOSIS — I10 Essential (primary) hypertension: Secondary | ICD-10-CM

## 2018-12-27 DIAGNOSIS — I517 Cardiomegaly: Secondary | ICD-10-CM | POA: Diagnosis not present

## 2018-12-27 NOTE — Patient Instructions (Signed)
Medication Instructions:  Your physician recommends that you continue on your current medications as directed. Please refer to the Current Medication list given to you today.  If you need a refill on your cardiac medications before your next appointment, please call your pharmacy.   Lab work: NONE   Testing/Procedures: Your physician has requested that you have an echocardiogram. Echocardiography is a painless test that uses sound waves to create images of your heart. It provides your doctor with information about the size and shape of your heart and how well your heart's chambers and valves are working. This procedure takes approximately one hour. There are no restrictions for this procedure.  Follow-Up: As needed

## 2018-12-30 ENCOUNTER — Ambulatory Visit: Payer: Self-pay | Admitting: Cardiology

## 2019-01-01 ENCOUNTER — Ambulatory Visit: Payer: PRIVATE HEALTH INSURANCE | Admitting: Internal Medicine

## 2019-01-01 ENCOUNTER — Other Ambulatory Visit (HOSPITAL_COMMUNITY): Payer: PRIVATE HEALTH INSURANCE

## 2019-01-09 ENCOUNTER — Ambulatory Visit (INDEPENDENT_AMBULATORY_CARE_PROVIDER_SITE_OTHER): Payer: PRIVATE HEALTH INSURANCE | Admitting: Internal Medicine

## 2019-01-09 ENCOUNTER — Other Ambulatory Visit: Payer: Self-pay

## 2019-01-09 ENCOUNTER — Encounter: Payer: Self-pay | Admitting: Internal Medicine

## 2019-01-09 ENCOUNTER — Ambulatory Visit: Payer: PRIVATE HEALTH INSURANCE | Admitting: Internal Medicine

## 2019-01-09 VITALS — BP 130/64 | HR 79 | Temp 98.7°F | Ht 66.6 in | Wt 199.4 lb

## 2019-01-09 DIAGNOSIS — G8929 Other chronic pain: Secondary | ICD-10-CM

## 2019-01-09 DIAGNOSIS — M25562 Pain in left knee: Secondary | ICD-10-CM

## 2019-01-09 DIAGNOSIS — Z992 Dependence on renal dialysis: Secondary | ICD-10-CM

## 2019-01-09 DIAGNOSIS — I10 Essential (primary) hypertension: Secondary | ICD-10-CM

## 2019-01-09 DIAGNOSIS — M25561 Pain in right knee: Secondary | ICD-10-CM | POA: Diagnosis not present

## 2019-01-09 DIAGNOSIS — I120 Hypertensive chronic kidney disease with stage 5 chronic kidney disease or end stage renal disease: Secondary | ICD-10-CM

## 2019-01-09 DIAGNOSIS — N186 End stage renal disease: Secondary | ICD-10-CM

## 2019-01-09 DIAGNOSIS — Z79899 Other long term (current) drug therapy: Secondary | ICD-10-CM

## 2019-01-09 DIAGNOSIS — I129 Hypertensive chronic kidney disease with stage 1 through stage 4 chronic kidney disease, or unspecified chronic kidney disease: Secondary | ICD-10-CM

## 2019-01-09 MED ORDER — SILDENAFIL CITRATE 50 MG PO TABS: 50.0000 mg | ORAL_TABLET | ORAL | 0 refills | Status: DC | PRN

## 2019-01-09 MED ORDER — AMLODIPINE BESYLATE 5 MG PO TABS: ORAL_TABLET | ORAL | 0 refills | Status: DC

## 2019-01-09 MED ORDER — SUCROFERRIC OXYHYDROXIDE 500 MG PO CHEW: 500.0000 mg | CHEWABLE_TABLET | Freq: Three times a day (TID) | ORAL | 0 refills | Status: DC

## 2019-01-09 MED ORDER — CALCITRIOL 0.25 MCG PO CAPS: 0.2500 ug | ORAL_CAPSULE | Freq: Every day | ORAL | 0 refills | Status: DC

## 2019-01-09 MED ORDER — MUPIROCIN 2 % EX OINT: 1.0000 "application " | TOPICAL_OINTMENT | Freq: Two times a day (BID) | CUTANEOUS | 0 refills | Status: DC

## 2019-01-09 NOTE — Progress Notes (Signed)
Subjective:     Patient ID: Dylan [name] , male    DOB: [birth year] , 58 y.o.   MRN: [id number]   Chief Complaint  Patient presents with  . Hypertension    f/u pain in both knees    HPI   1-FU HTN. He saw his cardiologist who told him to hold from taking Norvasc since his BP was getting very low. Told him to only take it if it got hot.  Does home dyalisis 4/week.  BP at home ran around 118/68-70. He tends to have white coat HTN.  The cardiologist told him to hold from taking his HTN med since it was low x 2 weeks.  Sees his nephrologist monthly who does labs and follows up with him.  He showed me his labs from nephrology and are:  9/1  triglycer- 204 Creat 11.4 potas- 4.4 hgb- 10.8 2- has hx of chronic knee pain R>L x 20+ years. Gets worse since he stands at work for 8h in the same position. Has used samples of Voltaren gel which helped some, but is too expensive since his insurance will not cover it.  Past Medical History:  Diagnosis Date  . Bilateral chronic knee pain 07/04/2018  . Chronic pain of left ankle 07/04/2018  . ESRD (end stage renal disease) ([place]) 03/29/2016  . Glucosuria 07/04/2018  . History of anemia due to CKD 03/29/2016  . Hyperkalemia, diminished renal excretion 03/29/2016  . Hypertension   . Hypertensive nephropathy 06/11/2018  . 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 Renal disorder   . Uremia of renal origin 03/29/2016     Family History  Problem Relation Age of Onset  . Cancer Mother   . Breast cancer Mother   . Prostate cancer Father   . Diabetes Brother      Current Outpatient Medications:  .  amLODipine (NORVASC) 5 MG tablet, On hold, Disp: 1 tablet, Rfl: 0 .  epoetin alfa (EPOGEN,PROCRIT) 4000 UNIT/ML injection, Inject 4,000 Units into the skin once a week. TUESDAYS, Disp: , Rfl:  .  multivitamin (RENA-VIT) TABS tablet, Take 1 tablet by mouth at bedtime., Disp: 30 tablet, Rfl: 0 .  calcitRIOL (ROCALTROL) 0.25  MCG capsule, Take 1 capsule (0.25 mcg total) by mouth daily., Disp: 1 capsule, Rfl: 0 .  mupirocin ointment (BACTROBAN) 2 %, Place 1 application into the nose 2 (two) times daily., Disp: 22 g, Rfl: 0 .  sildenafil (VIAGRA) 50 MG tablet, Take 1 tablet (50 mg total) by mouth as needed for erectile dysfunction., Disp: 1 tablet, Rfl: 0 .  sucroferric oxyhydroxide (VELPHORO) 500 MG chewable tablet, Chew 1 tablet (500 mg total) by mouth 3 (three) times daily with meals., Disp: 1 tablet, Rfl: 0 No Known Allergies   Review of Systems  + bilateral knee pain, neg for CP, SOB, edema, abdominal pain, constipation, or urinary difficulties.  Today's Vitals   01/09/19 1549  BP: (!) 164/82  Pulse: 79  Temp: 98.7 F (37.1 C)  TempSrc: Oral  Weight: 199 lb 6.4 oz (90.4 kg)  Height: 5' 6.6" (1.692 m)  PainSc: 4    Body mass index is 31.61 kg/m.   Objective:  Physical Exam  Repeated BP 130/64 Constitutional: he is oriented to person, place, and time. he appears well-developed and well-nourished. No distress.  HENT:  Head: Normocephalic and atraumatic.  Right Ear: External ear normal.  Left Ear: External ear normal.  Nose: Nose normal.  Eyes: Conjunctivae are normal. Right eye exhibits  no discharge. Left eye exhibits no discharge. No scleral icterus.  Neck: Neck supple. No thyromegaly present.  No carotid bruits bilaterally  Cardiovascular: Normal rate and regular rhythm.  No murmur heard. Pulmonary/Chest: Effort normal and breath sounds normal. No respiratory distress.  Musculoskeletal: Normal range of motion. He exhibits no edema. KNEES- R knee joint is larger than L. Has mild crepitations in both, no effusion. Has bilateral joint line tenderness R>L.  Lymphadenopathy: he has no cervical adenopathy.  Neurological:he is alert and oriented to person, place, and time.  Skin: Skin is warm and dry. Capillary refill takes less than 2 seconds. No rash noted. he is not diaphoretic.  Psychiatric: he has  a normal mood and affect. Hisbehavior is normal. Judgment and thought content normal.  Nursing note reviewed.  Assessment And Plan:    1. Essential hypertension- in remission without medication. Will hold from taking Norvasc unless it gets > 140/90 - CBC no Diff  2. Chronic pain of both knees- sent to ortho to try Synvic or hyalgan.  - DG Knee 3 Views Left; Future - DG Knee 3 Views Right; Future - Ambulatory referral to Orthopedic Surgery  3. Long-term use of high-risk medication- chronic - Liver Profile  4. Hypertensive nephropathy- chronic. No action.   5. ESRD (end stage renal disease) ([place])- stable. No action.   FU in 6 months. We will inform him of his labs when they are back.     Anushri Casalino RODRIGUEZ-SOUTHWORTH, PA-C    THE PATIENT IS ENCOURAGED TO PRACTICE SOCIAL DISTANCING DUE TO THE COVID-19 PANDEMIC.

## 2019-01-10 LAB — HEPATIC FUNCTION PANEL
ALT: 11 IU/L (ref 0–44)
AST: 10 IU/L (ref 0–40)
Albumin: 5.1 g/dL — ABNORMAL HIGH (ref 3.8–4.9)
Alkaline Phosphatase: 72 IU/L (ref 39–117)
Bilirubin Total: 0.5 mg/dL (ref 0.0–1.2)
Bilirubin, Direct: 0.11 mg/dL (ref 0.00–0.40)
Total Protein: 7.8 g/dL (ref 6.0–8.5)

## 2019-01-10 LAB — CBC
Hematocrit: 32.1 % — ABNORMAL LOW (ref 37.5–51.0)
Hemoglobin: 10.9 g/dL — ABNORMAL LOW (ref 13.0–17.7)
MCH: 29.1 pg (ref 26.6–33.0)
MCHC: 34 g/dL (ref 31.5–35.7)
MCV: 86 fL (ref 79–97)
Platelets: 240 10*3/uL (ref 150–450)
RBC: 3.75 x10E6/uL — ABNORMAL LOW (ref 4.14–5.80)
RDW: 15 % (ref 11.6–15.4)
WBC: 9 10*3/uL (ref 3.4–10.8)

## 2019-01-14 ENCOUNTER — Other Ambulatory Visit: Payer: Self-pay

## 2019-01-14 ENCOUNTER — Ambulatory Visit: Payer: Self-pay

## 2019-01-14 ENCOUNTER — Ambulatory Visit (INDEPENDENT_AMBULATORY_CARE_PROVIDER_SITE_OTHER): Payer: PRIVATE HEALTH INSURANCE | Admitting: Orthopaedic Surgery

## 2019-01-14 DIAGNOSIS — M1712 Unilateral primary osteoarthritis, left knee: Secondary | ICD-10-CM | POA: Insufficient documentation

## 2019-01-14 DIAGNOSIS — M1711 Unilateral primary osteoarthritis, right knee: Secondary | ICD-10-CM | POA: Diagnosis not present

## 2019-01-14 DIAGNOSIS — M25461 Effusion, right knee: Secondary | ICD-10-CM | POA: Insufficient documentation

## 2019-01-14 MED ORDER — BUPIVACAINE HCL 0.5 % IJ SOLN
2.0000 mL | INTRAMUSCULAR | Status: AC | PRN
  Administered 2019-01-14: 2 mL via INTRA_ARTICULAR

## 2019-01-14 MED ORDER — METHYLPREDNISOLONE ACETATE 40 MG/ML IJ SUSP
40.0000 mg | INTRAMUSCULAR | Status: AC | PRN
  Administered 2019-01-14: 40 mg via INTRA_ARTICULAR

## 2019-01-14 MED ORDER — LIDOCAINE HCL 1 % IJ SOLN
2.0000 mL | INTRAMUSCULAR | Status: AC | PRN
  Administered 2019-01-14: 2 mL

## 2019-01-14 NOTE — Progress Notes (Signed)
Office Visit Note   Patient: Dylan [name]           Date of Birth: [birth year]           MRN: [id number] Visit Date: 01/14/2019              Requested by: Minus Breeding, MD [address] [place],  [place] [address] PCP: Minus Breeding, MD   Assessment & Plan: Visit Diagnoses:  1. Primary osteoarthritis of right knee   2. Primary osteoarthritis of left knee   3. Effusion, right knee     Plan: Impression is bilateral knee osteoarthritis with right knee joint effusion.  Aspiration of 45 cc blood tinged fluid and injection performed today.  Patient tolerated this well.  In terms of his left knee, he's doing fine with this so no intervention needed today.  Today's encounter was performed through an interpreter.  Follow-Up Instructions: Return if symptoms worsen or fail to improve.   Orders:  Orders Placed This Encounter  Procedures   Large Joint Inj   XR Knee Complete 4 Views Right   XR Knee Complete 4 Views Left   No orders of the defined types were placed in this encounter.     Procedures: Large Joint Inj: R knee on 01/14/2019 3:47 PM Indications: pain Details: 22 G needle  Arthrogram: No  Medications: 40 mg methylPREDNISolone acetate 40 MG/ML; 2 mL lidocaine 1 %; 2 mL bupivacaine 0.5 % Consent was given by the patient. Patient was prepped and draped in the usual sterile fashion.       Clinical Data: No additional findings.   Subjective: Chief Complaint  Patient presents with   Right Knee - Pain   Left Knee - Pain    Dylan [name] is a 58 year old [place] gentleman who comes in with his daughter and his Spanish interpreter for evaluation of bilateral knee pain mostly the right knee.  He states that he had a left knee cortisone injection about 3 years ago but as well which helped significantly.  Recently his right knee has been bothering him and he has noticed significant swelling.  This is worse with prolonged activity and standing.  He cannot take  NSAIDs due to ESRD on HD.     Review of Systems  Constitutional: Negative.   All other systems reviewed and are negative.    Objective: Vital Signs: There were no vitals taken for this visit.  Physical Exam Vitals signs and nursing note reviewed.  Constitutional:      Appearance: He is well-developed.  HENT:     Head: Normocephalic and atraumatic.  Eyes:     Pupils: Pupils are equal, round, and reactive to light.  Neck:     Musculoskeletal: Neck supple.  Pulmonary:     Effort: Pulmonary effort is normal.  Abdominal:     Palpations: Abdomen is soft.  Musculoskeletal: Normal range of motion.  Skin:    General: Skin is warm.  Neurological:     Mental Status: He is alert and oriented to person, place, and time.  Psychiatric:        Behavior: Behavior normal.        Thought Content: Thought content normal.        Judgment: Judgment normal.     Ortho Exam Left knee exam shows a trace joint effusion.  Otherwise exam is unremarkable. Right knee exam shows a large joint effusion.  No joint line tenderness.  Collaterals and cruciates are stable.  Relatively well-preserved range  of motion. Specialty Comments:  No specialty comments available.  Imaging: Xr Knee Complete 4 Views Left  Result Date: 01/14/2019 Bone-on-bone medial joint space narrowing.  Advanced tricompartmental degenerative joint disease.  Xr Knee Complete 4 Views Right  Result Date: 01/14/2019 Moderate DJD.    PMFS History: Patient Active Problem List   Diagnosis Date Noted   Primary osteoarthritis of right knee 01/14/2019   Primary osteoarthritis of left knee 01/14/2019   Effusion, right knee 01/14/2019   LVH (left ventricular hypertrophy) 12/27/2018   Chronic pain of left ankle 07/04/2018   Bilateral chronic knee pain 07/04/2018   Glucosuria 07/04/2018   Hypertensive nephropathy 06/11/2018   Hyperkalemia, diminished renal excretion 03/29/2016   ESRD (end stage renal disease) ([place])  03/29/2016   Uremia of renal origin 03/29/2016   HTN (hypertension) 16/01/9603   Metabolic acidosis, NAG, 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   History of anemia due to CKD 03/29/2016   Past Medical History:  Diagnosis Date   Bilateral chronic knee pain 07/04/2018   Chronic pain of left ankle 07/04/2018   ESRD (end stage renal disease) ([place]) 03/29/2016   Glucosuria 07/04/2018   History of anemia due to CKD 03/29/2016   Hyperkalemia, diminished renal excretion 03/29/2016   Hypertension    Hypertensive nephropathy 5/40/9811   Metabolic acidosis, NAG, 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   Renal disorder    Uremia of renal origin 03/29/2016    Family History  Problem Relation Age of Onset   Cancer Mother    Breast cancer Mother    Prostate cancer Father    Diabetes Brother     Past Surgical History:  Procedure Laterality Date   BASCILIC VEIN TRANSPOSITION Left 04/06/2016   Procedure: Left Arm Single Stage Bascilic Vein Transposition;  Surgeon: Waynetta Sandy, MD;  Location: [facility];  Service: Vascular;  Laterality: Left;   INSERTION OF DIALYSIS CATHETER Right 04/06/2016   Procedure: INSERTION OF Tunneled DIALYSIS CATHETER RIGHT INTERNAL JUGULAR;  Surgeon: Waynetta Sandy, MD;  Location: [facility];  Service: Vascular;  Laterality: Right;   IR GENERIC HISTORICAL  06/26/2016   IR REMOVAL TUN CV CATH W/O FL 06/26/2016 Arne Cleveland, MD [facility]   TOOTH EXTRACTION  2020   Social History   Occupational History   Occupation: [employer]  Tobacco Use   Smoking status: Never Smoker   Smokeless tobacco: Never Used  Substance and Sexual Activity   Alcohol use: No   Drug use: No   Sexual activity: Yes    Birth control/protection: None

## 2019-01-14 NOTE — Telephone Encounter (Signed)
Opened in error

## 2019-01-15 LAB — SYNOVIAL CELL COUNT + DIFF, W/ CRYSTALS
Basophils, %: 0 %
Eosinophils-Synovial: 0 % (ref 0–2)
Lymphocytes-Synovial Fld: 30 % (ref 0–74)
Monocyte/Macrophage: 58 % (ref 0–69)
Neutrophil, Synovial: 10 % (ref 0–24)
Synoviocytes, %: 2 % (ref 0–15)
WBC, Synovial: 210 cells/uL — ABNORMAL HIGH (ref <150)

## 2019-01-15 LAB — TIQ-NTM

## 2019-01-15 NOTE — Progress Notes (Signed)
Fluid is normal.

## 2019-01-16 ENCOUNTER — Ambulatory Visit: Payer: PRIVATE HEALTH INSURANCE | Admitting: Orthopaedic Surgery

## 2019-01-23 ENCOUNTER — Encounter (HOSPITAL_COMMUNITY): Payer: Self-pay | Admitting: Cardiology

## 2019-01-24 ENCOUNTER — Telehealth (HOSPITAL_COMMUNITY): Payer: Self-pay

## 2019-01-24 NOTE — Telephone Encounter (Signed)
New message    Just an FYI. We have made several attempts to contact this patient including sending a letter to schedule or reschedule their echocardiogram. We will be removing the patient from the echo WQ.   9.24.20 mail reminder letter Dylan [name]   9.3.20 @ 8:55am lm on home vm - Dylan [name]. 9.2.20no show

## 2019-01-24 NOTE — Telephone Encounter (Deleted)
Patient seeing Dr Roxy Manns 10/1 and will call office afterward to discuss [facility] appointment

## 2019-03-15 DIAGNOSIS — Z992 Dependence on renal dialysis: Secondary | ICD-10-CM | POA: Insufficient documentation

## 2019-03-15 DIAGNOSIS — N186 End stage renal disease: Secondary | ICD-10-CM | POA: Insufficient documentation

## 2019-03-19 ENCOUNTER — Encounter: Payer: Self-pay | Admitting: Internal Medicine

## 2019-03-20 ENCOUNTER — Other Ambulatory Visit: Payer: Self-pay | Admitting: Internal Medicine

## 2019-03-20 DIAGNOSIS — Z1211 Encounter for screening for malignant neoplasm of colon: Secondary | ICD-10-CM

## 2019-03-20 NOTE — Progress Notes (Signed)
Screen colonoscopy ordered, this is request per [facility] renal transplant provider.

## 2019-04-16 ENCOUNTER — Ambulatory Visit (INDEPENDENT_AMBULATORY_CARE_PROVIDER_SITE_OTHER): Payer: PRIVATE HEALTH INSURANCE | Admitting: Gastroenterology

## 2019-04-16 ENCOUNTER — Encounter: Payer: Self-pay | Admitting: Gastroenterology

## 2019-04-16 VITALS — BP 128/80 | HR 88 | Temp 98.3°F | Ht 65.5 in | Wt 204.0 lb

## 2019-04-16 DIAGNOSIS — Z1211 Encounter for screening for malignant neoplasm of colon: Secondary | ICD-10-CM | POA: Diagnosis not present

## 2019-04-16 DIAGNOSIS — N186 End stage renal disease: Secondary | ICD-10-CM

## 2019-04-16 DIAGNOSIS — Z992 Dependence on renal dialysis: Secondary | ICD-10-CM

## 2019-04-16 DIAGNOSIS — M1990 Unspecified osteoarthritis, unspecified site: Secondary | ICD-10-CM

## 2019-04-16 DIAGNOSIS — Z1159 Encounter for screening for other viral diseases: Secondary | ICD-10-CM

## 2019-04-16 DIAGNOSIS — D631 Anemia in chronic kidney disease: Secondary | ICD-10-CM

## 2019-04-16 DIAGNOSIS — I120 Hypertensive chronic kidney disease with stage 5 chronic kidney disease or end stage renal disease: Secondary | ICD-10-CM

## 2019-04-16 MED ORDER — SUPREP BOWEL PREP KIT 17.5-3.13-1.6 GM/177ML PO SOLN: 1.0000 | ORAL | 0 refills | Status: DC

## 2019-04-16 NOTE — Patient Instructions (Signed)
You have been scheduled for a colonoscopy. Please follow written instructions given to you at your visit today.  Please pick up your prep supplies at the pharmacy within the next 1-3 days. If you use inhalers (even only as needed), please bring them with you on the day of your procedure.  If you are age 60 or younger, your body mass index should be between 19-25. Your Body mass index is 33.43 kg/m. If this is out of the aformentioned range listed, please consider follow up with your Primary Care Provider.   We have sent the following medications to your pharmacy for you to pick up at your convenience: Suprep   Thank you for choosing me and [facility].  Dr. Rush Landmark

## 2019-04-16 NOTE — Progress Notes (Signed)
[place] VISIT   Primary Care Provider Donato Heinz, MD [address] [place] [address] [phone]  Referring Provider Shelby Mattocks, [place],  [place] [address] [phone]  Patient Profile: Dylan [name] is a 58 y.o. male with a pmh significant for end-stage renal disease (on HD Mon/Tue/Thur/Fri)-awaiting kidney transplant evaluation, anemia of chronic disease, hypertension, arthritis.  The patient presents to the [facility] for an evaluation and management of problem(s) noted below:  Problem List 1. Encounter for screening colonoscopy   2. Screening for viral disease     History of Present Illness This is the patient's first visit to the outpatient [place] clinic.  The patient was referred by his nephrology team at [facility] for consideration of a screening colonoscopy to get him up-to-date before kidney transplant evaluation could continue.  Patient states that in 2015 or 2016 he underwent a colonoscopy back in [place] and was told that it was normal without polyps.  They cannot get these records however.  Patient states that he has a bowel movement on a daily basis.  No blood in his stools.  Patient does not have any other significant GI symptoms including any issues with heartburn or reflux or dysphagia or odynophagia.  Weight has been relatively stable.  No abdominal pain.  There is no family history of GI malignancies.  Patient has never had an upper endoscopy.  He does not take any significant nonsteroidals or BC/Goody powders.  GI Review of Systems Positive as above Negative for nausea, vomiting, change in bowel habits, melena, hematochezia  Review of Systems General: Denies fevers/chills/weight loss HEENT: Denies oral lesions Cardiovascular: Denies chest pain/palpitations Pulmonary: Denies shortness of breath Gastroenterological: See HPI Hematological: Denies easy  bruising/bleeding Endocrine: Denies temperature intolerance Dermatological: Denies jaundice Psychological: Mood is stable Musculoskeletal: Denies new arthralgias   Medications Current Outpatient Medications  Medication Sig Dispense Refill  . amLODipine (NORVASC) 5 MG tablet On hold 1 tablet 0  . calcitRIOL (ROCALTROL) 0.25 MCG capsule Take 1 capsule (0.25 mcg total) by mouth daily. 1 capsule 0  . cinacalcet (SENSIPAR) 60 MG tablet Take 1 tablet by mouth daily.    Marland Kitchen epoetin alfa (EPOGEN,PROCRIT) 4000 UNIT/ML injection Inject 4,000 Units into the skin once a week. TUESDAYS    . multivitamin (RENA-VIT) TABS tablet Take 1 tablet by mouth at bedtime. 30 tablet 0  . sildenafil (VIAGRA) 50 MG tablet Take 1 tablet (50 mg total) by mouth as needed for erectile dysfunction. 1 tablet 0  . sucroferric oxyhydroxide (VELPHORO) 500 MG chewable tablet Chew 1 tablet (500 mg total) by mouth 3 (three) times daily with meals. 1 tablet 0  . Na Sulfate-K Sulfate-Mg Sulf (SUPREP BOWEL PREP KIT) 17.5-3.13-1.6 GM/177ML SOLN Take 1 kit by mouth as directed. For colonoscopy prep 354 mL 0   No current facility-administered medications for this visit.    Allergies No Known Allergies  Histories Past Medical History:  Diagnosis Date  . Bilateral chronic knee pain 07/04/2018  . Chronic pain of left ankle 07/04/2018  . ESRD (end stage renal disease) ([facility]) 03/29/2016  . Glucosuria 07/04/2018  . History of anemia due to CKD 03/29/2016  . Hyperkalemia, diminished renal excretion 03/29/2016  . Hypertension   . Hypertensive nephropathy 06/11/2018  . 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 Renal disorder   . Uremia of renal origin 03/29/2016   Past Surgical History:  Procedure Laterality Date  . BASCILIC VEIN TRANSPOSITION Left 04/06/2016  Procedure: Left Arm Single Stage Bascilic Vein Transposition;  Surgeon: Waynetta Sandy, MD;  Location: [facility];  Service: Vascular;   Laterality: Left;  . INSERTION OF DIALYSIS CATHETER Right 04/06/2016   Procedure: INSERTION OF Tunneled DIALYSIS CATHETER RIGHT INTERNAL JUGULAR;  Surgeon: Waynetta Sandy, MD;  Location: [facility];  Service: Vascular;  Laterality: Right;  . IR GENERIC HISTORICAL  06/26/2016   IR REMOVAL TUN CV CATH W/O FL 06/26/2016 Arne Cleveland, MD [facility]-INTERV RAD  . TOOTH EXTRACTION  2020   Social History   Socioeconomic History  . Marital status: Married    Spouse name: Not on file  . Number of children: 3  . Years of education: Not on file  . Highest education level: Not on file  Occupational History  . Occupation: [employer]  Tobacco Use  . Smoking status: Never Smoker  . Smokeless tobacco: Never Used  Substance and Sexual Activity  . Alcohol use: No  . Drug use: No  . Sexual activity: Yes    Birth control/protection: None  Other Topics Concern  . Not on file  Social History Narrative  . Not on file   Social Determinants of Health   Financial Resource Strain:   . Difficulty of Paying Living Expenses: Not on file  Food Insecurity:   . Worried About Charity fundraiser in the Last Year: Not on file  . Ran Out of Food in the Last Year: Not on file  Transportation Needs:   . Lack of Transportation (Medical): Not on file  . Lack of Transportation (Non-Medical): Not on file  Physical Activity:   . Days of Exercise per Week: Not on file  . Minutes of Exercise per Session: Not on file  Stress:   . Feeling of Stress : Not on file  Social Connections:   . Frequency of Communication with Friends and Family: Not on file  . Frequency of Social Gatherings with Friends and Family: Not on file  . Attends Religious Services: Not on file  . Active Member of Clubs or Organizations: Not on file  . Attends Archivist Meetings: Not on file  . Marital Status: Not on file  Intimate Partner Violence:   . Fear of Current or Ex-Partner: Not on file  . Emotionally Abused: Not on file  .  Physically Abused: Not on file  . Sexually Abused: Not on file   Family History  Problem Relation Age of Onset  . Breast cancer Mother   . Prostate cancer Father   . Diabetes Brother   . Breast cancer Sister   . Colon cancer Neg Hx   . Esophageal cancer Neg Hx   . Inflammatory bowel disease Neg Hx   . Liver disease Neg Hx   . Pancreatic cancer Neg Hx   . Rectal cancer Neg Hx   . Stomach cancer Neg Hx    I have reviewed his medical, social, and family history in detail and updated the electronic medical record as necessary.    PHYSICAL EXAMINATION  BP 128/80 (BP Location: Right Arm, Patient Position: Sitting, Cuff Size: Normal)   Pulse 88   Temp 98.3 F (36.8 C)   Ht 5' 5.5" (1.664 m) Comment: height measured without shoes  Wt 204 lb (92.5 kg)   BMI 33.43 kg/m  Wt Readings from Last 3 Encounters:  04/16/19 204 lb (92.5 kg)  01/09/19 199 lb 6.4 oz (90.4 kg)  12/27/18 197 lb 12.8 oz (89.7 kg)  GEN: NAD,  appears stated age, doesn't appear chronically ill PSYCH: Cooperative, without pressured speech EYE: Conjunctivae pink, sclerae anicteric ENT: MMM, without oral ulcers, no erythema or exudates noted NECK: Supple CV: RR without R/Gs  RESP: CTAB posteriorly, without wheezing GI: NABS, soft, protuberant abdomen, NT, without rebound or guarding, no HSM appreciated MSK/EXT: Bilateral lower extremity edema present SKIN: No jaundice NEURO:  Alert & Oriented x 3, no focal deficits   REVIEW OF DATA  I reviewed the following data at the time of this encounter:  GI Procedures and Studies  Reported 2015/2016 colonoscopy in [place] reportedly abnormal unable to get records  Laboratory Studies  Reviewed that in epic and care everywhere  Imaging Studies  December 2020 CT chest abdomen pelvis (at [facility]) FINDINGS: Identification of pathology in the solid organs and bowel is limited due to the absence of intravenous contrast. Chest:  - Pleura: No pleural effusion.  - Cardiac:  No pericardial effusion. Coronary artery calcifications.  - Pulmonary: No pulmonary consolidation. No suspicious pulmonary nodules.  - Lymph Nodes: No enlarged lymph nodes. Abdomen/Pelvis:  - Liver: Normal liver contour.  - Billary: Unremarkable  - Spleen: Unremarkable  - Pancreas: No peripancreatic fluid collection.  - Adrenal Glands: Unremarkable  - Kidneys: An exophytic left lower pole renal lesion measuring approximately 1.9 cm is identified with small amount of peripheral calcification. This demonstrates an average density 27 HU.  - Bladder: Unremarkable  - Bowel: No bowel obstruction.  - Reproductive: Unremarkable  - Lymph Nodes: No enlarged lymph nodes.  - Other: There is moderate calcification of the infrarenal abdominal aorta. There is mild calcified atherosclerotic plaque the bilateral common iliac arteries. There is minimal atherosclerotic plaque of the bilateral external iliac arteries. Musculoskeletal:  - Bones: Lucent foci are identified at L1 and L2. These are favored to be degenerative in nature. A sclerotic focus in L4 is identified. This measures 1.5 cm. This is favored to represent an atypical hemangioma.  - Soft Tissues: Unremarkable IMPRESSION: 1) An indeterminate lesion is identified at the lower pole of the left kidney. This could be further evaluated with dedicated contrast-enhanced cyst protocol CT or MRI. 2) There is mild calcified atherosclerotic plaque the bilateral common iliac arteries. There is minimal atherosclerotic plaque of the bilateral external iliac arteries.    ASSESSMENT  Mr. Schertzer is a 58 y.o. male  with a pmh significant for end-stage renal disease (on HD Mon/Tue/Thur/Fri)-awaiting kidney transplant evaluation, anemia of chronic disease, hypertension, arthritis.  The patient is seen today for evaluation and management of:  1. Encounter for screening colonoscopy   2. Screening for viral disease    The patient is clinically and  hemodynamically stable.  He has no evidence of significant GI symptoms.  He reports a previous colonoscopy in 2015/2060 in [place] that was normal however his nephrology transplant team needs to have an updated colonoscopy before moving forward with further work-up/management.  We will move forward with a screening colonoscopy for the patient.  The risks and benefits of endoscopic evaluation were discussed with the patient; these include but are not limited to the risk of perforation, infection, bleeding, missed lesions, lack of diagnosis, severe illness requiring hospitalization, as well as anesthesia and sedation related illnesses.  The patient is agreeable to proceed.  All patient questions were answered, to the best of my ability, and the patient agrees to the aforementioned plan of action with follow-up as indicated.   PLAN  Proceed with scheduling colonoscopy Further recommendations based on findings at time of  colonoscopy   Orders Placed This Encounter  Procedures  . SARS Coronavirus 2 ([facility] ONLY)  . Ambulatory referral to Gastroenterology    New Prescriptions   NA SULFATE-K SULFATE-MG SULF (SUPREP BOWEL PREP KIT) 17.5-3.13-1.6 GM/177ML SOLN    Take 1 kit by mouth as directed. For colonoscopy prep   Modified Medications   No medications on file    Planned Follow Up No follow-ups on file.   Justice Britain, MD [facility] Advanced Endoscopy Office # [phone]

## 2019-04-17 ENCOUNTER — Encounter: Payer: Self-pay | Admitting: Gastroenterology

## 2019-04-17 DIAGNOSIS — Z0000 Encounter for general adult medical examination without abnormal findings: Secondary | ICD-10-CM | POA: Insufficient documentation

## 2019-04-17 DIAGNOSIS — Z1159 Encounter for screening for other viral diseases: Secondary | ICD-10-CM | POA: Insufficient documentation

## 2019-04-17 DIAGNOSIS — Z1211 Encounter for screening for malignant neoplasm of colon: Secondary | ICD-10-CM | POA: Insufficient documentation

## 2019-04-23 ENCOUNTER — Other Ambulatory Visit: Payer: Self-pay

## 2019-04-23 MED ORDER — SUPREP BOWEL PREP KIT 17.5-3.13-1.6 GM/177ML PO SOLN: 1.0000 | ORAL | 0 refills | Status: DC

## 2019-05-02 DIAGNOSIS — N186 End stage renal disease: Secondary | ICD-10-CM | POA: Diagnosis not present

## 2019-05-02 DIAGNOSIS — E8770 Fluid overload, unspecified: Secondary | ICD-10-CM | POA: Diagnosis not present

## 2019-05-02 DIAGNOSIS — N2581 Secondary hyperparathyroidism of renal origin: Secondary | ICD-10-CM | POA: Diagnosis not present

## 2019-05-02 DIAGNOSIS — E875 Hyperkalemia: Secondary | ICD-10-CM | POA: Diagnosis not present

## 2019-05-02 DIAGNOSIS — I120 Hypertensive chronic kidney disease with stage 5 chronic kidney disease or end stage renal disease: Secondary | ICD-10-CM | POA: Diagnosis not present

## 2019-05-02 DIAGNOSIS — Z992 Dependence on renal dialysis: Secondary | ICD-10-CM | POA: Diagnosis not present

## 2019-05-05 DIAGNOSIS — E8770 Fluid overload, unspecified: Secondary | ICD-10-CM | POA: Diagnosis not present

## 2019-05-05 DIAGNOSIS — Z992 Dependence on renal dialysis: Secondary | ICD-10-CM | POA: Diagnosis not present

## 2019-05-05 DIAGNOSIS — N2581 Secondary hyperparathyroidism of renal origin: Secondary | ICD-10-CM | POA: Diagnosis not present

## 2019-05-05 DIAGNOSIS — E875 Hyperkalemia: Secondary | ICD-10-CM | POA: Diagnosis not present

## 2019-05-05 DIAGNOSIS — I120 Hypertensive chronic kidney disease with stage 5 chronic kidney disease or end stage renal disease: Secondary | ICD-10-CM | POA: Diagnosis not present

## 2019-05-05 DIAGNOSIS — N186 End stage renal disease: Secondary | ICD-10-CM | POA: Diagnosis not present

## 2019-05-06 ENCOUNTER — Encounter: Payer: Self-pay | Admitting: Gastroenterology

## 2019-05-06 DIAGNOSIS — I120 Hypertensive chronic kidney disease with stage 5 chronic kidney disease or end stage renal disease: Secondary | ICD-10-CM | POA: Diagnosis not present

## 2019-05-06 DIAGNOSIS — E8770 Fluid overload, unspecified: Secondary | ICD-10-CM | POA: Diagnosis not present

## 2019-05-06 DIAGNOSIS — N2581 Secondary hyperparathyroidism of renal origin: Secondary | ICD-10-CM | POA: Diagnosis not present

## 2019-05-06 DIAGNOSIS — E875 Hyperkalemia: Secondary | ICD-10-CM | POA: Diagnosis not present

## 2019-05-06 DIAGNOSIS — Z992 Dependence on renal dialysis: Secondary | ICD-10-CM | POA: Diagnosis not present

## 2019-05-06 DIAGNOSIS — N186 End stage renal disease: Secondary | ICD-10-CM | POA: Diagnosis not present

## 2019-05-07 ENCOUNTER — Other Ambulatory Visit: Payer: Self-pay | Admitting: Gastroenterology

## 2019-05-07 ENCOUNTER — Ambulatory Visit (INDEPENDENT_AMBULATORY_CARE_PROVIDER_SITE_OTHER): Payer: BC Managed Care – PPO

## 2019-05-07 DIAGNOSIS — Z1159 Encounter for screening for other viral diseases: Secondary | ICD-10-CM | POA: Diagnosis not present

## 2019-05-08 DIAGNOSIS — E8770 Fluid overload, unspecified: Secondary | ICD-10-CM | POA: Diagnosis not present

## 2019-05-08 DIAGNOSIS — I120 Hypertensive chronic kidney disease with stage 5 chronic kidney disease or end stage renal disease: Secondary | ICD-10-CM | POA: Diagnosis not present

## 2019-05-08 DIAGNOSIS — E875 Hyperkalemia: Secondary | ICD-10-CM | POA: Diagnosis not present

## 2019-05-08 DIAGNOSIS — N186 End stage renal disease: Secondary | ICD-10-CM | POA: Diagnosis not present

## 2019-05-08 DIAGNOSIS — Z992 Dependence on renal dialysis: Secondary | ICD-10-CM | POA: Diagnosis not present

## 2019-05-08 DIAGNOSIS — N2581 Secondary hyperparathyroidism of renal origin: Secondary | ICD-10-CM | POA: Diagnosis not present

## 2019-05-08 LAB — SARS CORONAVIRUS 2 (TAT 6-24 HRS): SARS Coronavirus 2: NEGATIVE

## 2019-05-09 ENCOUNTER — Other Ambulatory Visit: Payer: Self-pay

## 2019-05-09 ENCOUNTER — Encounter: Payer: Self-pay | Admitting: Gastroenterology

## 2019-05-09 ENCOUNTER — Ambulatory Visit (AMBULATORY_SURGERY_CENTER): Payer: BC Managed Care – PPO | Admitting: Gastroenterology

## 2019-05-09 VITALS — BP 100/62 | HR 68 | Temp 97.8°F | Resp 18 | Ht 65.5 in | Wt 204.0 lb

## 2019-05-09 DIAGNOSIS — N2581 Secondary hyperparathyroidism of renal origin: Secondary | ICD-10-CM | POA: Diagnosis not present

## 2019-05-09 DIAGNOSIS — Z1211 Encounter for screening for malignant neoplasm of colon: Secondary | ICD-10-CM

## 2019-05-09 DIAGNOSIS — I120 Hypertensive chronic kidney disease with stage 5 chronic kidney disease or end stage renal disease: Secondary | ICD-10-CM | POA: Diagnosis not present

## 2019-05-09 DIAGNOSIS — D123 Benign neoplasm of transverse colon: Secondary | ICD-10-CM

## 2019-05-09 DIAGNOSIS — E875 Hyperkalemia: Secondary | ICD-10-CM | POA: Diagnosis not present

## 2019-05-09 DIAGNOSIS — D122 Benign neoplasm of ascending colon: Secondary | ICD-10-CM

## 2019-05-09 DIAGNOSIS — D128 Benign neoplasm of rectum: Secondary | ICD-10-CM | POA: Diagnosis not present

## 2019-05-09 DIAGNOSIS — D129 Benign neoplasm of anus and anal canal: Secondary | ICD-10-CM | POA: Diagnosis not present

## 2019-05-09 DIAGNOSIS — K621 Rectal polyp: Secondary | ICD-10-CM | POA: Diagnosis not present

## 2019-05-09 DIAGNOSIS — N186 End stage renal disease: Secondary | ICD-10-CM | POA: Diagnosis not present

## 2019-05-09 DIAGNOSIS — E8770 Fluid overload, unspecified: Secondary | ICD-10-CM | POA: Diagnosis not present

## 2019-05-09 DIAGNOSIS — Z992 Dependence on renal dialysis: Secondary | ICD-10-CM | POA: Diagnosis not present

## 2019-05-09 MED ORDER — SODIUM CHLORIDE 0.9 % IV SOLN: 500.0000 mL | Freq: Once | INTRAVENOUS | Status: DC

## 2019-05-09 NOTE — Progress Notes (Signed)
Called to room to assist during endoscopic procedure.  Patient ID and intended procedure confirmed with present staff. Received instructions for my participation in the procedure from the performing physician.  

## 2019-05-09 NOTE — Patient Instructions (Signed)
6 small polyps removed.  You may see some bleeding.  Only be concerned if its a lot. You have diverticulosis and non-bleeding hemorrhoids.   USTED TUVO UN PROCEDIMIENTO ENDOSCPICO HOY EN EL [facility]:   Lea el informe del procedimiento que se le entreg para cualquier pregunta especfica sobre lo que se Primary school teacher.  Si el informe del examen no responde a sus preguntas, por favor llame a su gastroenterlogo para aclararlo.  Si usted solicit que no se le den [employer] de lo que se Estate manager/land agent en su procedimiento al [employer] va a cuidar, entonces el informe del procedimiento se ha incluido en un sobre sellado para que usted lo revise despus cuando le sea ms conveniente.   LO QUE PUEDE ESPERAR: Algunas sensaciones de hinchazn en el abdomen.  Puede tener ms gases de lo normal.  El caminar puede ayudarle a eliminar el aire que se le puso en el tracto gastrointestinal durante el procedimiento y reducir la hinchazn.  Si le hicieron una endoscopia inferior (como una colonoscopia o una sigmoidoscopia flexible), podra notar manchas de sangre en las heces fecales o en el papel higinico.  Si se someti a una preparacin intestinal para su procedimiento, es posible que no tenga una evacuacin intestinal normal durante [employer].   Tenga en cuenta:  Es posible que note un poco de irritacin y congestin en la nariz o algn drenaje.  Esto es debido al oxgeno [employer] durante su procedimiento.  No hay que preocuparse y esto debe desaparecer ms o Scientist, research (medical).   SNTOMAS PARA REPORTAR INMEDIATAMENTE:  Despus de una endoscopia inferior (colonoscopia o sigmoidoscopia flexible):  Cantidades excesivas de sangre en las heces fecales  Sensibilidad significativa o empeoramiento de los dolores abdominales   Hinchazn aguda del abdomen que antes no tena   Fiebre de 100F o ms   Despus de la endoscopia superior (EGD)  Vmitos de Biochemist, clinical o material como caf molido   Dolor en el pecho o dolor debajo de los omplatos que antes no tena   Dolor o dificultad persistente para tragar  Falta de aire que antes no tena   Fiebre de 100F o ms  Heces fecales negras y pegajosas   Para asuntos urgentes o de Freight forwarder, puede comunicarse con un gastroenterlogo a cualquier hora llamando al [phone].  DIETA:  Recomendamos una comida pequea al principio, pero luego puede continuar con su dieta normal.  Tome muchos lquidos, Teacher, adult education las bebidas alcohlicas durante 24 horas.    ACTIVIDAD:  Debe planear tomarse las cosas con calma por el resto del da y no debe CONDUCIR ni usar maquinaria pesada Programmer, applications (debido a los medicamentos de sedacin utilizados durante el examen).     SEGUIMIENTO: Nuestro personal llamar al nmero que aparece en su historial al siguiente da hbil de su procedimiento para ver cmo se siente y para responder cualquier pregunta o inquietud que pueda tener con respecto a la informacin que se le dio despus del procedimiento. Si no podemos contactarle, le dejaremos un mensaje.  Sin embargo, si se siente bien y no tiene Paediatric nurse, no es necesario que nos devuelva la llamada.  Asumiremos que ha regresado a sus actividades diarias normales sin incidentes. Si se le tomaron algunas biopsias, le contactaremos por telfono o por carta en las prximas 3 semanas.  Si no ha sabido [employer] biopsias en el transcurso de 3 semanas, por favor llmenos al [phone].  FIRMAS/CONFIDENCIALIDAD: Usted y/o el acompaante que le cuide han firmado documentos que se ingresarn en su historial mdico Emergency planning/management officer.  Estas firmas atestiguan el hecho de que la informacin anterior    YOU HAD AN ENDOSCOPIC PROCEDURE TODAY AT [facility]:   Refer to the procedure report that was given to you for any specific questions about what was found during the examination.  If the procedure report does not answer your questions, please  call your gastroenterologist to clarify.  If you requested that your care partner not be given the details of your procedure findings, then the procedure report has been included in a sealed envelope for you to review at your convenience later.  YOU SHOULD EXPECT: Some feelings of bloating in the abdomen. Passage of more gas than usual.  Walking can help get rid of the air that was put into your GI tract during the procedure and reduce the bloating. If you had a lower endoscopy (such as a colonoscopy or flexible sigmoidoscopy) you may notice spotting of blood in your stool or on the toilet paper. If you underwent a bowel prep for your procedure, you may not have a normal bowel movement for a few days.  Please Note:  You might notice some irritation and congestion in your nose or some drainage.  This is from the oxygen used during your procedure.  There is no need for concern and it should clear up in a day or so.  SYMPTOMS TO REPORT IMMEDIATELY:   Following lower endoscopy (colonoscopy or flexible sigmoidoscopy):  Excessive amounts of blood in the stool  Significant tenderness or worsening of abdominal pains  Swelling of the abdomen that is new, acute  Fever of 100F or higher   For urgent or emergent issues, a gastroenterologist can be reached at any hour by calling [phone].   DIET:  We do recommend a small meal at first, but then you may proceed to your regular diet.  Drink plenty of fluids but you should avoid alcoholic beverages for 24 hours.  ACTIVITY:  You should plan to take it easy for the rest of today and you should NOT DRIVE or use heavy machinery until tomorrow (because of the sedation medicines used during the test).    FOLLOW UP: Our staff will call the number listed on your records 48-72 hours following your procedure to check on you and address any questions or concerns that you may have regarding the information given to you following your procedure. If we do not reach  you, we will leave a message.  We will attempt to reach you two times.  During this call, we will ask if you have developed any symptoms of COVID 19. If you develop any symptoms (ie: fever, flu-like symptoms, shortness of breath, cough etc.) before then, please call [phone].  If you test positive for Covid 19 in the 2 weeks post procedure, please call and report this information to [place].    If any biopsies were taken you will be contacted by phone or by letter within the next 1-3 weeks.  Please call us at [phone] if you have not heard about the biopsies in 3 weeks.    SIGNATURES/CONFIDENTIALITY: You and/or your care partner have signed paperwork which will be entered into your electronic medical record.  These signatures attest to the fact that that the information above on your After Visit Summary has been reviewed and is understood.  Full responsibility of the confidentiality of this discharge  information lies with you and/or your care-partner. 

## 2019-05-09 NOTE — Progress Notes (Signed)
Temp by [place], Vitals by KA

## 2019-05-09 NOTE — Progress Notes (Signed)
PT taken to PACU. Monitors in place. VSS. Report given to RN. 

## 2019-05-09 NOTE — Op Note (Signed)
[place] Patient Name: Dylan [name] Procedure Date: 05/09/2019 1:59 PM MRN: [id number] Endoscopist: Justice Britain , MD Age: 59 Referring MD:  Date of Birth: 1960/12/19 Gender: Male Account #: [id number] Procedure:                Colonoscopy Indications:              Screening for colorectal malignant neoplasm - Needs                            prior to Renal Transplantation Evaluation Completion Medicines:                Monitored Anesthesia Care Procedure:                Pre-Anesthesia Assessment:                           - Prior to the procedure, a History and Physical                            was performed, and patient medications and                            allergies were reviewed. The patient's tolerance of                            previous anesthesia was also reviewed. The risks                            and benefits of the procedure and the sedation                            options and risks were discussed with the patient.                            All questions were answered, and informed consent                            was obtained. Prior Anticoagulants: The patient has                            taken no previous anticoagulant or antiplatelet                            agents. ASA Grade Assessment: III - A patient with                            severe systemic disease. After reviewing the risks                            and benefits, the patient was deemed in                            satisfactory condition to undergo the procedure.  After obtaining informed consent, the colonoscope                            was passed under direct vision. Throughout the                            procedure, the patient's blood pressure, pulse, and                            oxygen saturations were monitored continuously. The                            Colonoscope was introduced through the anus and   advanced to the 5 cm into the ileum. The                            colonoscopy was performed without difficulty. The                            patient tolerated the procedure. The quality of the                            bowel preparation was adequate. The terminal ileum,                            ileocecal valve, appendiceal orifice, and rectum                            were photographed. Scope In: 2:18:30 PM Scope Out: 2:47:12 PM Scope Withdrawal Time: 0 hours 25 minutes 34 seconds  Total Procedure Duration: 0 hours 28 minutes 42 seconds  Findings:                 The digital rectal exam findings include                            hemorrhoids. Pertinent negatives include no                            palpable rectal lesions.                           The terminal ileum and ileocecal valve appeared                            normal.                           Six sessile polyps were found in the rectum (1),                            transverse colon (3) and ascending colon (2). The                            polyps were 2 to 5 mm in size. These polyps were  removed with a cold snare. Resection and retrieval                            were complete. The rectal polyp was just above the                            dentate line and had some oozing that slowed by                            time of completion of procedure after pressure to                            the region.                           Multiple small-mouthed diverticula were found in                            the recto-sigmoid colon and sigmoid colon and the                            ascending colon.                           Normal mucosa was found in the entire colon                            otherwise.                           Non-bleeding non-thrombosed internal hemorrhoids                            were found during retroflexion, during perianal                            exam and  during digital exam. The hemorrhoids were                            Grade II (internal hemorrhoids that prolapse but                            reduce spontaneously). Complications:            No immediate complications. Estimated Blood Loss:     Estimated blood loss was minimal. Impression:               - Hemorrhoids found on digital rectal exam.                           - The examined portion of the ileum was normal.                           - Six 2 to 5 mm polyps in the rectum, in the  transverse colon and in the ascending colon,                            removed with a cold snare. Resected and retrieved.                           - Diverticulosis in the recto-sigmoid colon and in                            the sigmoid colon and the ascending colon.                           - Normal mucosa in the entire examined colon                            otherwise.                           - Non-bleeding non-thrombosed internal hemorrhoids. Recommendation:           - The patient will be observed post-procedure,                            until all discharge criteria are met.                           - Discharge patient to home.                           - Patient has a contact number available for                            emergencies. The signs and symptoms of potential                            delayed complications were discussed with the                            patient. Return to normal activities tomorrow.                            Written discharge instructions were provided to the                            patient.                           - High fiber diet.                           - Use FiberCon 1 tablet PO daily.                           - Continue present medications.                           -  Await pathology results.                           - Repeat colonoscopy in 3 - 5 years for                            surveillance based on  pathology results and                            findings of adenomatous tissue.                           - The findings and recommendations were discussed                            with the patient. Justice Britain, MD 05/09/2019 2:53:44 PM

## 2019-05-12 DIAGNOSIS — E8770 Fluid overload, unspecified: Secondary | ICD-10-CM | POA: Diagnosis not present

## 2019-05-12 DIAGNOSIS — Z992 Dependence on renal dialysis: Secondary | ICD-10-CM | POA: Diagnosis not present

## 2019-05-12 DIAGNOSIS — N186 End stage renal disease: Secondary | ICD-10-CM | POA: Diagnosis not present

## 2019-05-12 DIAGNOSIS — I120 Hypertensive chronic kidney disease with stage 5 chronic kidney disease or end stage renal disease: Secondary | ICD-10-CM | POA: Diagnosis not present

## 2019-05-12 DIAGNOSIS — N2581 Secondary hyperparathyroidism of renal origin: Secondary | ICD-10-CM | POA: Diagnosis not present

## 2019-05-12 DIAGNOSIS — E875 Hyperkalemia: Secondary | ICD-10-CM | POA: Diagnosis not present

## 2019-05-13 ENCOUNTER — Telehealth: Payer: Self-pay

## 2019-05-13 DIAGNOSIS — I120 Hypertensive chronic kidney disease with stage 5 chronic kidney disease or end stage renal disease: Secondary | ICD-10-CM | POA: Diagnosis not present

## 2019-05-13 DIAGNOSIS — E8770 Fluid overload, unspecified: Secondary | ICD-10-CM | POA: Diagnosis not present

## 2019-05-13 DIAGNOSIS — Z992 Dependence on renal dialysis: Secondary | ICD-10-CM | POA: Diagnosis not present

## 2019-05-13 DIAGNOSIS — N2581 Secondary hyperparathyroidism of renal origin: Secondary | ICD-10-CM | POA: Diagnosis not present

## 2019-05-13 DIAGNOSIS — N186 End stage renal disease: Secondary | ICD-10-CM | POA: Diagnosis not present

## 2019-05-13 DIAGNOSIS — E875 Hyperkalemia: Secondary | ICD-10-CM | POA: Diagnosis not present

## 2019-05-13 NOTE — Telephone Encounter (Signed)
  Follow up Call-  Call back number 05/09/2019  Post procedure Call Back phone  # [phone]  Permission to leave phone message Yes  Some recent data might be hidden     Patient questions:  Do you have a fever, pain , or abdominal swelling? No. Pain Score  0 *  Have you tolerated food without any problems? Yes.    Have you been able to return to your normal activities? Yes.    Do you have any questions about your discharge instructions: Diet   No. Medications  No. Follow up visit  No.  Do you have questions or concerns about your Care? No.  Actions: * If pain score is 4 or above: 1. No action needed, pain <4.Have you developed a fever since your procedure? no  2.   Have you had an respiratory symptoms (SOB or cough) since your procedure? no  3.   Have you tested positive for COVID 19 since your procedure no  4.   Have you had any family members/close contacts diagnosed with the COVID 19 since your procedure?  no   If yes to any of these questions please route to Joylene John, RN and Alphonsa Gin, Therapist, sports.

## 2019-05-14 ENCOUNTER — Encounter: Payer: Self-pay | Admitting: Gastroenterology

## 2019-05-15 DIAGNOSIS — N186 End stage renal disease: Secondary | ICD-10-CM | POA: Diagnosis not present

## 2019-05-15 DIAGNOSIS — I120 Hypertensive chronic kidney disease with stage 5 chronic kidney disease or end stage renal disease: Secondary | ICD-10-CM | POA: Diagnosis not present

## 2019-05-15 DIAGNOSIS — Z992 Dependence on renal dialysis: Secondary | ICD-10-CM | POA: Diagnosis not present

## 2019-05-15 DIAGNOSIS — E875 Hyperkalemia: Secondary | ICD-10-CM | POA: Diagnosis not present

## 2019-05-15 DIAGNOSIS — N2581 Secondary hyperparathyroidism of renal origin: Secondary | ICD-10-CM | POA: Diagnosis not present

## 2019-05-15 DIAGNOSIS — E8770 Fluid overload, unspecified: Secondary | ICD-10-CM | POA: Diagnosis not present

## 2019-05-16 DIAGNOSIS — I120 Hypertensive chronic kidney disease with stage 5 chronic kidney disease or end stage renal disease: Secondary | ICD-10-CM | POA: Diagnosis not present

## 2019-05-16 DIAGNOSIS — E8770 Fluid overload, unspecified: Secondary | ICD-10-CM | POA: Diagnosis not present

## 2019-05-16 DIAGNOSIS — N186 End stage renal disease: Secondary | ICD-10-CM | POA: Diagnosis not present

## 2019-05-16 DIAGNOSIS — N2581 Secondary hyperparathyroidism of renal origin: Secondary | ICD-10-CM | POA: Diagnosis not present

## 2019-05-16 DIAGNOSIS — Z992 Dependence on renal dialysis: Secondary | ICD-10-CM | POA: Diagnosis not present

## 2019-05-16 DIAGNOSIS — E875 Hyperkalemia: Secondary | ICD-10-CM | POA: Diagnosis not present

## 2019-05-19 DIAGNOSIS — I120 Hypertensive chronic kidney disease with stage 5 chronic kidney disease or end stage renal disease: Secondary | ICD-10-CM | POA: Diagnosis not present

## 2019-05-19 DIAGNOSIS — E8770 Fluid overload, unspecified: Secondary | ICD-10-CM | POA: Diagnosis not present

## 2019-05-19 DIAGNOSIS — E875 Hyperkalemia: Secondary | ICD-10-CM | POA: Diagnosis not present

## 2019-05-19 DIAGNOSIS — Z992 Dependence on renal dialysis: Secondary | ICD-10-CM | POA: Diagnosis not present

## 2019-05-19 DIAGNOSIS — N186 End stage renal disease: Secondary | ICD-10-CM | POA: Diagnosis not present

## 2019-05-19 DIAGNOSIS — N2581 Secondary hyperparathyroidism of renal origin: Secondary | ICD-10-CM | POA: Diagnosis not present

## 2019-05-20 DIAGNOSIS — N2581 Secondary hyperparathyroidism of renal origin: Secondary | ICD-10-CM | POA: Diagnosis not present

## 2019-05-20 DIAGNOSIS — Z992 Dependence on renal dialysis: Secondary | ICD-10-CM | POA: Diagnosis not present

## 2019-05-20 DIAGNOSIS — E875 Hyperkalemia: Secondary | ICD-10-CM | POA: Diagnosis not present

## 2019-05-20 DIAGNOSIS — I120 Hypertensive chronic kidney disease with stage 5 chronic kidney disease or end stage renal disease: Secondary | ICD-10-CM | POA: Diagnosis not present

## 2019-05-20 DIAGNOSIS — N186 End stage renal disease: Secondary | ICD-10-CM | POA: Diagnosis not present

## 2019-05-20 DIAGNOSIS — E8770 Fluid overload, unspecified: Secondary | ICD-10-CM | POA: Diagnosis not present

## 2019-05-21 DIAGNOSIS — N2581 Secondary hyperparathyroidism of renal origin: Secondary | ICD-10-CM | POA: Diagnosis not present

## 2019-05-21 DIAGNOSIS — Z992 Dependence on renal dialysis: Secondary | ICD-10-CM | POA: Diagnosis not present

## 2019-05-21 DIAGNOSIS — I120 Hypertensive chronic kidney disease with stage 5 chronic kidney disease or end stage renal disease: Secondary | ICD-10-CM | POA: Diagnosis not present

## 2019-05-21 DIAGNOSIS — E8770 Fluid overload, unspecified: Secondary | ICD-10-CM | POA: Diagnosis not present

## 2019-05-21 DIAGNOSIS — N186 End stage renal disease: Secondary | ICD-10-CM | POA: Diagnosis not present

## 2019-05-21 DIAGNOSIS — E875 Hyperkalemia: Secondary | ICD-10-CM | POA: Diagnosis not present

## 2019-05-22 DIAGNOSIS — N186 End stage renal disease: Secondary | ICD-10-CM | POA: Diagnosis not present

## 2019-05-22 DIAGNOSIS — I120 Hypertensive chronic kidney disease with stage 5 chronic kidney disease or end stage renal disease: Secondary | ICD-10-CM | POA: Diagnosis not present

## 2019-05-22 DIAGNOSIS — Z992 Dependence on renal dialysis: Secondary | ICD-10-CM | POA: Diagnosis not present

## 2019-05-22 DIAGNOSIS — N2581 Secondary hyperparathyroidism of renal origin: Secondary | ICD-10-CM | POA: Diagnosis not present

## 2019-05-22 DIAGNOSIS — E8770 Fluid overload, unspecified: Secondary | ICD-10-CM | POA: Diagnosis not present

## 2019-05-22 DIAGNOSIS — E875 Hyperkalemia: Secondary | ICD-10-CM | POA: Diagnosis not present

## 2019-05-23 DIAGNOSIS — E8770 Fluid overload, unspecified: Secondary | ICD-10-CM | POA: Diagnosis not present

## 2019-05-23 DIAGNOSIS — N2581 Secondary hyperparathyroidism of renal origin: Secondary | ICD-10-CM | POA: Diagnosis not present

## 2019-05-23 DIAGNOSIS — E875 Hyperkalemia: Secondary | ICD-10-CM | POA: Diagnosis not present

## 2019-05-23 DIAGNOSIS — I120 Hypertensive chronic kidney disease with stage 5 chronic kidney disease or end stage renal disease: Secondary | ICD-10-CM | POA: Diagnosis not present

## 2019-05-23 DIAGNOSIS — Z992 Dependence on renal dialysis: Secondary | ICD-10-CM | POA: Diagnosis not present

## 2019-05-23 DIAGNOSIS — N186 End stage renal disease: Secondary | ICD-10-CM | POA: Diagnosis not present

## 2019-05-26 DIAGNOSIS — Z992 Dependence on renal dialysis: Secondary | ICD-10-CM | POA: Diagnosis not present

## 2019-05-26 DIAGNOSIS — I120 Hypertensive chronic kidney disease with stage 5 chronic kidney disease or end stage renal disease: Secondary | ICD-10-CM | POA: Diagnosis not present

## 2019-05-26 DIAGNOSIS — N186 End stage renal disease: Secondary | ICD-10-CM | POA: Diagnosis not present

## 2019-05-26 DIAGNOSIS — N2581 Secondary hyperparathyroidism of renal origin: Secondary | ICD-10-CM | POA: Diagnosis not present

## 2019-05-26 DIAGNOSIS — E875 Hyperkalemia: Secondary | ICD-10-CM | POA: Diagnosis not present

## 2019-05-26 DIAGNOSIS — E8770 Fluid overload, unspecified: Secondary | ICD-10-CM | POA: Diagnosis not present

## 2019-05-27 DIAGNOSIS — E8770 Fluid overload, unspecified: Secondary | ICD-10-CM | POA: Diagnosis not present

## 2019-05-27 DIAGNOSIS — E875 Hyperkalemia: Secondary | ICD-10-CM | POA: Diagnosis not present

## 2019-05-27 DIAGNOSIS — N186 End stage renal disease: Secondary | ICD-10-CM | POA: Diagnosis not present

## 2019-05-27 DIAGNOSIS — I120 Hypertensive chronic kidney disease with stage 5 chronic kidney disease or end stage renal disease: Secondary | ICD-10-CM | POA: Diagnosis not present

## 2019-05-27 DIAGNOSIS — Z992 Dependence on renal dialysis: Secondary | ICD-10-CM | POA: Diagnosis not present

## 2019-05-27 DIAGNOSIS — N2581 Secondary hyperparathyroidism of renal origin: Secondary | ICD-10-CM | POA: Diagnosis not present

## 2019-05-29 DIAGNOSIS — N2581 Secondary hyperparathyroidism of renal origin: Secondary | ICD-10-CM | POA: Diagnosis not present

## 2019-05-29 DIAGNOSIS — Z992 Dependence on renal dialysis: Secondary | ICD-10-CM | POA: Diagnosis not present

## 2019-05-29 DIAGNOSIS — N186 End stage renal disease: Secondary | ICD-10-CM | POA: Diagnosis not present

## 2019-05-29 DIAGNOSIS — E8770 Fluid overload, unspecified: Secondary | ICD-10-CM | POA: Diagnosis not present

## 2019-05-29 DIAGNOSIS — I120 Hypertensive chronic kidney disease with stage 5 chronic kidney disease or end stage renal disease: Secondary | ICD-10-CM | POA: Diagnosis not present

## 2019-05-29 DIAGNOSIS — E875 Hyperkalemia: Secondary | ICD-10-CM | POA: Diagnosis not present

## 2019-05-30 DIAGNOSIS — E875 Hyperkalemia: Secondary | ICD-10-CM | POA: Diagnosis not present

## 2019-05-30 DIAGNOSIS — E8770 Fluid overload, unspecified: Secondary | ICD-10-CM | POA: Diagnosis not present

## 2019-05-30 DIAGNOSIS — Z992 Dependence on renal dialysis: Secondary | ICD-10-CM | POA: Diagnosis not present

## 2019-05-30 DIAGNOSIS — N2581 Secondary hyperparathyroidism of renal origin: Secondary | ICD-10-CM | POA: Diagnosis not present

## 2019-05-30 DIAGNOSIS — N186 End stage renal disease: Secondary | ICD-10-CM | POA: Diagnosis not present

## 2019-05-30 DIAGNOSIS — I120 Hypertensive chronic kidney disease with stage 5 chronic kidney disease or end stage renal disease: Secondary | ICD-10-CM | POA: Diagnosis not present

## 2019-06-02 DIAGNOSIS — Z992 Dependence on renal dialysis: Secondary | ICD-10-CM | POA: Diagnosis not present

## 2019-06-02 DIAGNOSIS — E8770 Fluid overload, unspecified: Secondary | ICD-10-CM | POA: Diagnosis not present

## 2019-06-02 DIAGNOSIS — N2581 Secondary hyperparathyroidism of renal origin: Secondary | ICD-10-CM | POA: Diagnosis not present

## 2019-06-02 DIAGNOSIS — N186 End stage renal disease: Secondary | ICD-10-CM | POA: Diagnosis not present

## 2019-06-02 DIAGNOSIS — I120 Hypertensive chronic kidney disease with stage 5 chronic kidney disease or end stage renal disease: Secondary | ICD-10-CM | POA: Diagnosis not present

## 2019-06-02 DIAGNOSIS — E875 Hyperkalemia: Secondary | ICD-10-CM | POA: Diagnosis not present

## 2019-06-03 DIAGNOSIS — N186 End stage renal disease: Secondary | ICD-10-CM | POA: Diagnosis not present

## 2019-06-03 DIAGNOSIS — N2581 Secondary hyperparathyroidism of renal origin: Secondary | ICD-10-CM | POA: Diagnosis not present

## 2019-06-03 DIAGNOSIS — E875 Hyperkalemia: Secondary | ICD-10-CM | POA: Diagnosis not present

## 2019-06-03 DIAGNOSIS — I120 Hypertensive chronic kidney disease with stage 5 chronic kidney disease or end stage renal disease: Secondary | ICD-10-CM | POA: Diagnosis not present

## 2019-06-03 DIAGNOSIS — E8770 Fluid overload, unspecified: Secondary | ICD-10-CM | POA: Diagnosis not present

## 2019-06-03 DIAGNOSIS — Z992 Dependence on renal dialysis: Secondary | ICD-10-CM | POA: Diagnosis not present

## 2019-06-05 DIAGNOSIS — Z992 Dependence on renal dialysis: Secondary | ICD-10-CM | POA: Diagnosis not present

## 2019-06-05 DIAGNOSIS — E875 Hyperkalemia: Secondary | ICD-10-CM | POA: Diagnosis not present

## 2019-06-05 DIAGNOSIS — E8770 Fluid overload, unspecified: Secondary | ICD-10-CM | POA: Diagnosis not present

## 2019-06-05 DIAGNOSIS — I120 Hypertensive chronic kidney disease with stage 5 chronic kidney disease or end stage renal disease: Secondary | ICD-10-CM | POA: Diagnosis not present

## 2019-06-05 DIAGNOSIS — N186 End stage renal disease: Secondary | ICD-10-CM | POA: Diagnosis not present

## 2019-06-05 DIAGNOSIS — N2581 Secondary hyperparathyroidism of renal origin: Secondary | ICD-10-CM | POA: Diagnosis not present

## 2019-06-06 DIAGNOSIS — Z992 Dependence on renal dialysis: Secondary | ICD-10-CM | POA: Diagnosis not present

## 2019-06-06 DIAGNOSIS — E8770 Fluid overload, unspecified: Secondary | ICD-10-CM | POA: Diagnosis not present

## 2019-06-06 DIAGNOSIS — E875 Hyperkalemia: Secondary | ICD-10-CM | POA: Diagnosis not present

## 2019-06-06 DIAGNOSIS — N186 End stage renal disease: Secondary | ICD-10-CM | POA: Diagnosis not present

## 2019-06-06 DIAGNOSIS — N2581 Secondary hyperparathyroidism of renal origin: Secondary | ICD-10-CM | POA: Diagnosis not present

## 2019-06-06 DIAGNOSIS — I120 Hypertensive chronic kidney disease with stage 5 chronic kidney disease or end stage renal disease: Secondary | ICD-10-CM | POA: Diagnosis not present

## 2019-06-09 DIAGNOSIS — N2581 Secondary hyperparathyroidism of renal origin: Secondary | ICD-10-CM | POA: Diagnosis not present

## 2019-06-09 DIAGNOSIS — N186 End stage renal disease: Secondary | ICD-10-CM | POA: Diagnosis not present

## 2019-06-09 DIAGNOSIS — E8770 Fluid overload, unspecified: Secondary | ICD-10-CM | POA: Diagnosis not present

## 2019-06-09 DIAGNOSIS — Z992 Dependence on renal dialysis: Secondary | ICD-10-CM | POA: Diagnosis not present

## 2019-06-09 DIAGNOSIS — I120 Hypertensive chronic kidney disease with stage 5 chronic kidney disease or end stage renal disease: Secondary | ICD-10-CM | POA: Diagnosis not present

## 2019-06-09 DIAGNOSIS — E875 Hyperkalemia: Secondary | ICD-10-CM | POA: Diagnosis not present

## 2019-06-10 DIAGNOSIS — Z992 Dependence on renal dialysis: Secondary | ICD-10-CM | POA: Diagnosis not present

## 2019-06-10 DIAGNOSIS — N2581 Secondary hyperparathyroidism of renal origin: Secondary | ICD-10-CM | POA: Diagnosis not present

## 2019-06-10 DIAGNOSIS — E875 Hyperkalemia: Secondary | ICD-10-CM | POA: Diagnosis not present

## 2019-06-10 DIAGNOSIS — E8770 Fluid overload, unspecified: Secondary | ICD-10-CM | POA: Diagnosis not present

## 2019-06-10 DIAGNOSIS — N186 End stage renal disease: Secondary | ICD-10-CM | POA: Diagnosis not present

## 2019-06-10 DIAGNOSIS — I120 Hypertensive chronic kidney disease with stage 5 chronic kidney disease or end stage renal disease: Secondary | ICD-10-CM | POA: Diagnosis not present

## 2019-06-12 DIAGNOSIS — E8770 Fluid overload, unspecified: Secondary | ICD-10-CM | POA: Diagnosis not present

## 2019-06-12 DIAGNOSIS — Z992 Dependence on renal dialysis: Secondary | ICD-10-CM | POA: Diagnosis not present

## 2019-06-12 DIAGNOSIS — E875 Hyperkalemia: Secondary | ICD-10-CM | POA: Diagnosis not present

## 2019-06-12 DIAGNOSIS — N2581 Secondary hyperparathyroidism of renal origin: Secondary | ICD-10-CM | POA: Diagnosis not present

## 2019-06-12 DIAGNOSIS — I120 Hypertensive chronic kidney disease with stage 5 chronic kidney disease or end stage renal disease: Secondary | ICD-10-CM | POA: Diagnosis not present

## 2019-06-12 DIAGNOSIS — N186 End stage renal disease: Secondary | ICD-10-CM | POA: Diagnosis not present

## 2019-06-13 DIAGNOSIS — Z992 Dependence on renal dialysis: Secondary | ICD-10-CM | POA: Diagnosis not present

## 2019-06-13 DIAGNOSIS — E8770 Fluid overload, unspecified: Secondary | ICD-10-CM | POA: Diagnosis not present

## 2019-06-13 DIAGNOSIS — E875 Hyperkalemia: Secondary | ICD-10-CM | POA: Diagnosis not present

## 2019-06-13 DIAGNOSIS — N2581 Secondary hyperparathyroidism of renal origin: Secondary | ICD-10-CM | POA: Diagnosis not present

## 2019-06-13 DIAGNOSIS — N186 End stage renal disease: Secondary | ICD-10-CM | POA: Diagnosis not present

## 2019-06-13 DIAGNOSIS — I120 Hypertensive chronic kidney disease with stage 5 chronic kidney disease or end stage renal disease: Secondary | ICD-10-CM | POA: Diagnosis not present

## 2019-06-16 DIAGNOSIS — N186 End stage renal disease: Secondary | ICD-10-CM | POA: Diagnosis not present

## 2019-06-16 DIAGNOSIS — N2581 Secondary hyperparathyroidism of renal origin: Secondary | ICD-10-CM | POA: Diagnosis not present

## 2019-06-16 DIAGNOSIS — E8770 Fluid overload, unspecified: Secondary | ICD-10-CM | POA: Diagnosis not present

## 2019-06-16 DIAGNOSIS — E875 Hyperkalemia: Secondary | ICD-10-CM | POA: Diagnosis not present

## 2019-06-16 DIAGNOSIS — Z992 Dependence on renal dialysis: Secondary | ICD-10-CM | POA: Diagnosis not present

## 2019-06-16 DIAGNOSIS — I120 Hypertensive chronic kidney disease with stage 5 chronic kidney disease or end stage renal disease: Secondary | ICD-10-CM | POA: Diagnosis not present

## 2019-06-17 DIAGNOSIS — N2581 Secondary hyperparathyroidism of renal origin: Secondary | ICD-10-CM | POA: Diagnosis not present

## 2019-06-17 DIAGNOSIS — Z992 Dependence on renal dialysis: Secondary | ICD-10-CM | POA: Diagnosis not present

## 2019-06-17 DIAGNOSIS — E875 Hyperkalemia: Secondary | ICD-10-CM | POA: Diagnosis not present

## 2019-06-17 DIAGNOSIS — E8770 Fluid overload, unspecified: Secondary | ICD-10-CM | POA: Diagnosis not present

## 2019-06-17 DIAGNOSIS — I120 Hypertensive chronic kidney disease with stage 5 chronic kidney disease or end stage renal disease: Secondary | ICD-10-CM | POA: Diagnosis not present

## 2019-06-17 DIAGNOSIS — N186 End stage renal disease: Secondary | ICD-10-CM | POA: Diagnosis not present

## 2019-06-19 DIAGNOSIS — E875 Hyperkalemia: Secondary | ICD-10-CM | POA: Diagnosis not present

## 2019-06-19 DIAGNOSIS — Z992 Dependence on renal dialysis: Secondary | ICD-10-CM | POA: Diagnosis not present

## 2019-06-19 DIAGNOSIS — E8770 Fluid overload, unspecified: Secondary | ICD-10-CM | POA: Diagnosis not present

## 2019-06-19 DIAGNOSIS — I120 Hypertensive chronic kidney disease with stage 5 chronic kidney disease or end stage renal disease: Secondary | ICD-10-CM | POA: Diagnosis not present

## 2019-06-19 DIAGNOSIS — N186 End stage renal disease: Secondary | ICD-10-CM | POA: Diagnosis not present

## 2019-06-19 DIAGNOSIS — N2581 Secondary hyperparathyroidism of renal origin: Secondary | ICD-10-CM | POA: Diagnosis not present

## 2019-06-20 DIAGNOSIS — E875 Hyperkalemia: Secondary | ICD-10-CM | POA: Diagnosis not present

## 2019-06-20 DIAGNOSIS — I120 Hypertensive chronic kidney disease with stage 5 chronic kidney disease or end stage renal disease: Secondary | ICD-10-CM | POA: Diagnosis not present

## 2019-06-20 DIAGNOSIS — N2581 Secondary hyperparathyroidism of renal origin: Secondary | ICD-10-CM | POA: Diagnosis not present

## 2019-06-20 DIAGNOSIS — N186 End stage renal disease: Secondary | ICD-10-CM | POA: Diagnosis not present

## 2019-06-20 DIAGNOSIS — E8770 Fluid overload, unspecified: Secondary | ICD-10-CM | POA: Diagnosis not present

## 2019-06-20 DIAGNOSIS — Z992 Dependence on renal dialysis: Secondary | ICD-10-CM | POA: Diagnosis not present

## 2019-06-23 DIAGNOSIS — N186 End stage renal disease: Secondary | ICD-10-CM | POA: Diagnosis not present

## 2019-06-23 DIAGNOSIS — N2581 Secondary hyperparathyroidism of renal origin: Secondary | ICD-10-CM | POA: Diagnosis not present

## 2019-06-23 DIAGNOSIS — I120 Hypertensive chronic kidney disease with stage 5 chronic kidney disease or end stage renal disease: Secondary | ICD-10-CM | POA: Diagnosis not present

## 2019-06-23 DIAGNOSIS — E875 Hyperkalemia: Secondary | ICD-10-CM | POA: Diagnosis not present

## 2019-06-23 DIAGNOSIS — E8770 Fluid overload, unspecified: Secondary | ICD-10-CM | POA: Diagnosis not present

## 2019-06-23 DIAGNOSIS — Z992 Dependence on renal dialysis: Secondary | ICD-10-CM | POA: Diagnosis not present

## 2019-06-24 DIAGNOSIS — E8770 Fluid overload, unspecified: Secondary | ICD-10-CM | POA: Diagnosis not present

## 2019-06-24 DIAGNOSIS — Z992 Dependence on renal dialysis: Secondary | ICD-10-CM | POA: Diagnosis not present

## 2019-06-24 DIAGNOSIS — E875 Hyperkalemia: Secondary | ICD-10-CM | POA: Diagnosis not present

## 2019-06-24 DIAGNOSIS — N2581 Secondary hyperparathyroidism of renal origin: Secondary | ICD-10-CM | POA: Diagnosis not present

## 2019-06-24 DIAGNOSIS — I120 Hypertensive chronic kidney disease with stage 5 chronic kidney disease or end stage renal disease: Secondary | ICD-10-CM | POA: Diagnosis not present

## 2019-06-24 DIAGNOSIS — N186 End stage renal disease: Secondary | ICD-10-CM | POA: Diagnosis not present

## 2019-06-26 DIAGNOSIS — E875 Hyperkalemia: Secondary | ICD-10-CM | POA: Diagnosis not present

## 2019-06-26 DIAGNOSIS — N186 End stage renal disease: Secondary | ICD-10-CM | POA: Diagnosis not present

## 2019-06-26 DIAGNOSIS — Z992 Dependence on renal dialysis: Secondary | ICD-10-CM | POA: Diagnosis not present

## 2019-06-26 DIAGNOSIS — I120 Hypertensive chronic kidney disease with stage 5 chronic kidney disease or end stage renal disease: Secondary | ICD-10-CM | POA: Diagnosis not present

## 2019-06-26 DIAGNOSIS — N2581 Secondary hyperparathyroidism of renal origin: Secondary | ICD-10-CM | POA: Diagnosis not present

## 2019-06-26 DIAGNOSIS — E8770 Fluid overload, unspecified: Secondary | ICD-10-CM | POA: Diagnosis not present

## 2019-06-27 DIAGNOSIS — N2581 Secondary hyperparathyroidism of renal origin: Secondary | ICD-10-CM | POA: Diagnosis not present

## 2019-06-27 DIAGNOSIS — E875 Hyperkalemia: Secondary | ICD-10-CM | POA: Diagnosis not present

## 2019-06-27 DIAGNOSIS — E8770 Fluid overload, unspecified: Secondary | ICD-10-CM | POA: Diagnosis not present

## 2019-06-27 DIAGNOSIS — Z992 Dependence on renal dialysis: Secondary | ICD-10-CM | POA: Diagnosis not present

## 2019-06-27 DIAGNOSIS — I120 Hypertensive chronic kidney disease with stage 5 chronic kidney disease or end stage renal disease: Secondary | ICD-10-CM | POA: Diagnosis not present

## 2019-06-27 DIAGNOSIS — N186 End stage renal disease: Secondary | ICD-10-CM | POA: Diagnosis not present

## 2019-06-29 DIAGNOSIS — Z992 Dependence on renal dialysis: Secondary | ICD-10-CM | POA: Diagnosis not present

## 2019-06-29 DIAGNOSIS — N186 End stage renal disease: Secondary | ICD-10-CM | POA: Diagnosis not present

## 2019-06-29 DIAGNOSIS — I158 Other secondary hypertension: Secondary | ICD-10-CM | POA: Diagnosis not present

## 2019-06-30 DIAGNOSIS — E8770 Fluid overload, unspecified: Secondary | ICD-10-CM | POA: Diagnosis not present

## 2019-06-30 DIAGNOSIS — Z992 Dependence on renal dialysis: Secondary | ICD-10-CM | POA: Diagnosis not present

## 2019-06-30 DIAGNOSIS — I120 Hypertensive chronic kidney disease with stage 5 chronic kidney disease or end stage renal disease: Secondary | ICD-10-CM | POA: Diagnosis not present

## 2019-06-30 DIAGNOSIS — N2581 Secondary hyperparathyroidism of renal origin: Secondary | ICD-10-CM | POA: Diagnosis not present

## 2019-06-30 DIAGNOSIS — E875 Hyperkalemia: Secondary | ICD-10-CM | POA: Diagnosis not present

## 2019-06-30 DIAGNOSIS — N186 End stage renal disease: Secondary | ICD-10-CM | POA: Diagnosis not present

## 2019-07-01 DIAGNOSIS — I120 Hypertensive chronic kidney disease with stage 5 chronic kidney disease or end stage renal disease: Secondary | ICD-10-CM | POA: Diagnosis not present

## 2019-07-01 DIAGNOSIS — E8770 Fluid overload, unspecified: Secondary | ICD-10-CM | POA: Diagnosis not present

## 2019-07-01 DIAGNOSIS — N2581 Secondary hyperparathyroidism of renal origin: Secondary | ICD-10-CM | POA: Diagnosis not present

## 2019-07-01 DIAGNOSIS — Z992 Dependence on renal dialysis: Secondary | ICD-10-CM | POA: Diagnosis not present

## 2019-07-01 DIAGNOSIS — E875 Hyperkalemia: Secondary | ICD-10-CM | POA: Diagnosis not present

## 2019-07-01 DIAGNOSIS — N186 End stage renal disease: Secondary | ICD-10-CM | POA: Diagnosis not present

## 2019-07-03 DIAGNOSIS — N186 End stage renal disease: Secondary | ICD-10-CM | POA: Diagnosis not present

## 2019-07-03 DIAGNOSIS — I120 Hypertensive chronic kidney disease with stage 5 chronic kidney disease or end stage renal disease: Secondary | ICD-10-CM | POA: Diagnosis not present

## 2019-07-03 DIAGNOSIS — N2581 Secondary hyperparathyroidism of renal origin: Secondary | ICD-10-CM | POA: Diagnosis not present

## 2019-07-03 DIAGNOSIS — E875 Hyperkalemia: Secondary | ICD-10-CM | POA: Diagnosis not present

## 2019-07-03 DIAGNOSIS — Z992 Dependence on renal dialysis: Secondary | ICD-10-CM | POA: Diagnosis not present

## 2019-07-03 DIAGNOSIS — E8770 Fluid overload, unspecified: Secondary | ICD-10-CM | POA: Diagnosis not present

## 2019-07-04 DIAGNOSIS — N186 End stage renal disease: Secondary | ICD-10-CM | POA: Diagnosis not present

## 2019-07-04 DIAGNOSIS — I120 Hypertensive chronic kidney disease with stage 5 chronic kidney disease or end stage renal disease: Secondary | ICD-10-CM | POA: Diagnosis not present

## 2019-07-04 DIAGNOSIS — E8770 Fluid overload, unspecified: Secondary | ICD-10-CM | POA: Diagnosis not present

## 2019-07-04 DIAGNOSIS — N2581 Secondary hyperparathyroidism of renal origin: Secondary | ICD-10-CM | POA: Diagnosis not present

## 2019-07-04 DIAGNOSIS — E875 Hyperkalemia: Secondary | ICD-10-CM | POA: Diagnosis not present

## 2019-07-04 DIAGNOSIS — Z992 Dependence on renal dialysis: Secondary | ICD-10-CM | POA: Diagnosis not present

## 2019-07-07 DIAGNOSIS — N186 End stage renal disease: Secondary | ICD-10-CM | POA: Diagnosis not present

## 2019-07-07 DIAGNOSIS — E8770 Fluid overload, unspecified: Secondary | ICD-10-CM | POA: Diagnosis not present

## 2019-07-07 DIAGNOSIS — E875 Hyperkalemia: Secondary | ICD-10-CM | POA: Diagnosis not present

## 2019-07-07 DIAGNOSIS — I120 Hypertensive chronic kidney disease with stage 5 chronic kidney disease or end stage renal disease: Secondary | ICD-10-CM | POA: Diagnosis not present

## 2019-07-07 DIAGNOSIS — Z992 Dependence on renal dialysis: Secondary | ICD-10-CM | POA: Diagnosis not present

## 2019-07-07 DIAGNOSIS — N2581 Secondary hyperparathyroidism of renal origin: Secondary | ICD-10-CM | POA: Diagnosis not present

## 2019-07-08 ENCOUNTER — Encounter: Payer: PRIVATE HEALTH INSURANCE | Admitting: Internal Medicine

## 2019-07-08 DIAGNOSIS — E8770 Fluid overload, unspecified: Secondary | ICD-10-CM | POA: Diagnosis not present

## 2019-07-08 DIAGNOSIS — E875 Hyperkalemia: Secondary | ICD-10-CM | POA: Diagnosis not present

## 2019-07-08 DIAGNOSIS — N2581 Secondary hyperparathyroidism of renal origin: Secondary | ICD-10-CM | POA: Diagnosis not present

## 2019-07-08 DIAGNOSIS — Z992 Dependence on renal dialysis: Secondary | ICD-10-CM | POA: Diagnosis not present

## 2019-07-08 DIAGNOSIS — I120 Hypertensive chronic kidney disease with stage 5 chronic kidney disease or end stage renal disease: Secondary | ICD-10-CM | POA: Diagnosis not present

## 2019-07-08 DIAGNOSIS — N186 End stage renal disease: Secondary | ICD-10-CM | POA: Diagnosis not present

## 2019-07-10 ENCOUNTER — Encounter: Payer: PRIVATE HEALTH INSURANCE | Admitting: Internal Medicine

## 2019-07-10 DIAGNOSIS — I120 Hypertensive chronic kidney disease with stage 5 chronic kidney disease or end stage renal disease: Secondary | ICD-10-CM | POA: Diagnosis not present

## 2019-07-10 DIAGNOSIS — N2581 Secondary hyperparathyroidism of renal origin: Secondary | ICD-10-CM | POA: Diagnosis not present

## 2019-07-10 DIAGNOSIS — E875 Hyperkalemia: Secondary | ICD-10-CM | POA: Diagnosis not present

## 2019-07-10 DIAGNOSIS — E8770 Fluid overload, unspecified: Secondary | ICD-10-CM | POA: Diagnosis not present

## 2019-07-10 DIAGNOSIS — N186 End stage renal disease: Secondary | ICD-10-CM | POA: Diagnosis not present

## 2019-07-10 DIAGNOSIS — Z992 Dependence on renal dialysis: Secondary | ICD-10-CM | POA: Diagnosis not present

## 2019-07-11 DIAGNOSIS — E8770 Fluid overload, unspecified: Secondary | ICD-10-CM | POA: Diagnosis not present

## 2019-07-11 DIAGNOSIS — N186 End stage renal disease: Secondary | ICD-10-CM | POA: Diagnosis not present

## 2019-07-11 DIAGNOSIS — I120 Hypertensive chronic kidney disease with stage 5 chronic kidney disease or end stage renal disease: Secondary | ICD-10-CM | POA: Diagnosis not present

## 2019-07-11 DIAGNOSIS — N2581 Secondary hyperparathyroidism of renal origin: Secondary | ICD-10-CM | POA: Diagnosis not present

## 2019-07-11 DIAGNOSIS — E875 Hyperkalemia: Secondary | ICD-10-CM | POA: Diagnosis not present

## 2019-07-11 DIAGNOSIS — Z992 Dependence on renal dialysis: Secondary | ICD-10-CM | POA: Diagnosis not present

## 2019-07-14 DIAGNOSIS — I120 Hypertensive chronic kidney disease with stage 5 chronic kidney disease or end stage renal disease: Secondary | ICD-10-CM | POA: Diagnosis not present

## 2019-07-14 DIAGNOSIS — Z992 Dependence on renal dialysis: Secondary | ICD-10-CM | POA: Diagnosis not present

## 2019-07-14 DIAGNOSIS — N2581 Secondary hyperparathyroidism of renal origin: Secondary | ICD-10-CM | POA: Diagnosis not present

## 2019-07-14 DIAGNOSIS — E875 Hyperkalemia: Secondary | ICD-10-CM | POA: Diagnosis not present

## 2019-07-14 DIAGNOSIS — N186 End stage renal disease: Secondary | ICD-10-CM | POA: Diagnosis not present

## 2019-07-14 DIAGNOSIS — E8770 Fluid overload, unspecified: Secondary | ICD-10-CM | POA: Diagnosis not present

## 2019-07-15 DIAGNOSIS — N186 End stage renal disease: Secondary | ICD-10-CM | POA: Diagnosis not present

## 2019-07-15 DIAGNOSIS — E8770 Fluid overload, unspecified: Secondary | ICD-10-CM | POA: Diagnosis not present

## 2019-07-15 DIAGNOSIS — E875 Hyperkalemia: Secondary | ICD-10-CM | POA: Diagnosis not present

## 2019-07-15 DIAGNOSIS — Z992 Dependence on renal dialysis: Secondary | ICD-10-CM | POA: Diagnosis not present

## 2019-07-15 DIAGNOSIS — I120 Hypertensive chronic kidney disease with stage 5 chronic kidney disease or end stage renal disease: Secondary | ICD-10-CM | POA: Diagnosis not present

## 2019-07-15 DIAGNOSIS — N2581 Secondary hyperparathyroidism of renal origin: Secondary | ICD-10-CM | POA: Diagnosis not present

## 2019-07-17 ENCOUNTER — Encounter: Payer: Self-pay | Admitting: Internal Medicine

## 2019-07-17 DIAGNOSIS — N186 End stage renal disease: Secondary | ICD-10-CM | POA: Diagnosis not present

## 2019-07-17 DIAGNOSIS — I120 Hypertensive chronic kidney disease with stage 5 chronic kidney disease or end stage renal disease: Secondary | ICD-10-CM | POA: Diagnosis not present

## 2019-07-17 DIAGNOSIS — E875 Hyperkalemia: Secondary | ICD-10-CM | POA: Diagnosis not present

## 2019-07-17 DIAGNOSIS — N2581 Secondary hyperparathyroidism of renal origin: Secondary | ICD-10-CM | POA: Diagnosis not present

## 2019-07-17 DIAGNOSIS — Z992 Dependence on renal dialysis: Secondary | ICD-10-CM | POA: Diagnosis not present

## 2019-07-17 DIAGNOSIS — E8770 Fluid overload, unspecified: Secondary | ICD-10-CM | POA: Diagnosis not present

## 2019-07-18 DIAGNOSIS — E875 Hyperkalemia: Secondary | ICD-10-CM | POA: Diagnosis not present

## 2019-07-18 DIAGNOSIS — Z992 Dependence on renal dialysis: Secondary | ICD-10-CM | POA: Diagnosis not present

## 2019-07-18 DIAGNOSIS — I120 Hypertensive chronic kidney disease with stage 5 chronic kidney disease or end stage renal disease: Secondary | ICD-10-CM | POA: Diagnosis not present

## 2019-07-18 DIAGNOSIS — N2581 Secondary hyperparathyroidism of renal origin: Secondary | ICD-10-CM | POA: Diagnosis not present

## 2019-07-18 DIAGNOSIS — N186 End stage renal disease: Secondary | ICD-10-CM | POA: Diagnosis not present

## 2019-07-18 DIAGNOSIS — E8770 Fluid overload, unspecified: Secondary | ICD-10-CM | POA: Diagnosis not present

## 2019-07-21 DIAGNOSIS — I120 Hypertensive chronic kidney disease with stage 5 chronic kidney disease or end stage renal disease: Secondary | ICD-10-CM | POA: Diagnosis not present

## 2019-07-21 DIAGNOSIS — Z992 Dependence on renal dialysis: Secondary | ICD-10-CM | POA: Diagnosis not present

## 2019-07-21 DIAGNOSIS — E8770 Fluid overload, unspecified: Secondary | ICD-10-CM | POA: Diagnosis not present

## 2019-07-21 DIAGNOSIS — N2581 Secondary hyperparathyroidism of renal origin: Secondary | ICD-10-CM | POA: Diagnosis not present

## 2019-07-21 DIAGNOSIS — E875 Hyperkalemia: Secondary | ICD-10-CM | POA: Diagnosis not present

## 2019-07-21 DIAGNOSIS — N186 End stage renal disease: Secondary | ICD-10-CM | POA: Diagnosis not present

## 2019-07-22 DIAGNOSIS — N186 End stage renal disease: Secondary | ICD-10-CM | POA: Diagnosis not present

## 2019-07-22 DIAGNOSIS — N2581 Secondary hyperparathyroidism of renal origin: Secondary | ICD-10-CM | POA: Diagnosis not present

## 2019-07-22 DIAGNOSIS — I120 Hypertensive chronic kidney disease with stage 5 chronic kidney disease or end stage renal disease: Secondary | ICD-10-CM | POA: Diagnosis not present

## 2019-07-22 DIAGNOSIS — E8770 Fluid overload, unspecified: Secondary | ICD-10-CM | POA: Diagnosis not present

## 2019-07-22 DIAGNOSIS — E875 Hyperkalemia: Secondary | ICD-10-CM | POA: Diagnosis not present

## 2019-07-22 DIAGNOSIS — Z992 Dependence on renal dialysis: Secondary | ICD-10-CM | POA: Diagnosis not present

## 2019-07-24 DIAGNOSIS — E8770 Fluid overload, unspecified: Secondary | ICD-10-CM | POA: Diagnosis not present

## 2019-07-24 DIAGNOSIS — N2581 Secondary hyperparathyroidism of renal origin: Secondary | ICD-10-CM | POA: Diagnosis not present

## 2019-07-24 DIAGNOSIS — E875 Hyperkalemia: Secondary | ICD-10-CM | POA: Diagnosis not present

## 2019-07-24 DIAGNOSIS — N186 End stage renal disease: Secondary | ICD-10-CM | POA: Diagnosis not present

## 2019-07-24 DIAGNOSIS — I120 Hypertensive chronic kidney disease with stage 5 chronic kidney disease or end stage renal disease: Secondary | ICD-10-CM | POA: Diagnosis not present

## 2019-07-24 DIAGNOSIS — Z992 Dependence on renal dialysis: Secondary | ICD-10-CM | POA: Diagnosis not present

## 2019-07-25 DIAGNOSIS — I120 Hypertensive chronic kidney disease with stage 5 chronic kidney disease or end stage renal disease: Secondary | ICD-10-CM | POA: Diagnosis not present

## 2019-07-25 DIAGNOSIS — N186 End stage renal disease: Secondary | ICD-10-CM | POA: Diagnosis not present

## 2019-07-25 DIAGNOSIS — N2581 Secondary hyperparathyroidism of renal origin: Secondary | ICD-10-CM | POA: Diagnosis not present

## 2019-07-25 DIAGNOSIS — E8770 Fluid overload, unspecified: Secondary | ICD-10-CM | POA: Diagnosis not present

## 2019-07-25 DIAGNOSIS — Z992 Dependence on renal dialysis: Secondary | ICD-10-CM | POA: Diagnosis not present

## 2019-07-25 DIAGNOSIS — E875 Hyperkalemia: Secondary | ICD-10-CM | POA: Diagnosis not present

## 2019-07-28 DIAGNOSIS — E8770 Fluid overload, unspecified: Secondary | ICD-10-CM | POA: Diagnosis not present

## 2019-07-28 DIAGNOSIS — I120 Hypertensive chronic kidney disease with stage 5 chronic kidney disease or end stage renal disease: Secondary | ICD-10-CM | POA: Diagnosis not present

## 2019-07-28 DIAGNOSIS — E875 Hyperkalemia: Secondary | ICD-10-CM | POA: Diagnosis not present

## 2019-07-28 DIAGNOSIS — N186 End stage renal disease: Secondary | ICD-10-CM | POA: Diagnosis not present

## 2019-07-28 DIAGNOSIS — Z992 Dependence on renal dialysis: Secondary | ICD-10-CM | POA: Diagnosis not present

## 2019-07-28 DIAGNOSIS — N2581 Secondary hyperparathyroidism of renal origin: Secondary | ICD-10-CM | POA: Diagnosis not present

## 2019-07-29 DIAGNOSIS — E875 Hyperkalemia: Secondary | ICD-10-CM | POA: Diagnosis not present

## 2019-07-29 DIAGNOSIS — E8770 Fluid overload, unspecified: Secondary | ICD-10-CM | POA: Diagnosis not present

## 2019-07-29 DIAGNOSIS — I120 Hypertensive chronic kidney disease with stage 5 chronic kidney disease or end stage renal disease: Secondary | ICD-10-CM | POA: Diagnosis not present

## 2019-07-29 DIAGNOSIS — N186 End stage renal disease: Secondary | ICD-10-CM | POA: Diagnosis not present

## 2019-07-29 DIAGNOSIS — Z992 Dependence on renal dialysis: Secondary | ICD-10-CM | POA: Diagnosis not present

## 2019-07-29 DIAGNOSIS — N2581 Secondary hyperparathyroidism of renal origin: Secondary | ICD-10-CM | POA: Diagnosis not present

## 2019-07-30 DIAGNOSIS — I158 Other secondary hypertension: Secondary | ICD-10-CM | POA: Diagnosis not present

## 2019-07-30 DIAGNOSIS — Z992 Dependence on renal dialysis: Secondary | ICD-10-CM | POA: Diagnosis not present

## 2019-07-30 DIAGNOSIS — N186 End stage renal disease: Secondary | ICD-10-CM | POA: Diagnosis not present

## 2019-07-31 DIAGNOSIS — I120 Hypertensive chronic kidney disease with stage 5 chronic kidney disease or end stage renal disease: Secondary | ICD-10-CM | POA: Diagnosis not present

## 2019-07-31 DIAGNOSIS — N2581 Secondary hyperparathyroidism of renal origin: Secondary | ICD-10-CM | POA: Diagnosis not present

## 2019-07-31 DIAGNOSIS — E875 Hyperkalemia: Secondary | ICD-10-CM | POA: Diagnosis not present

## 2019-07-31 DIAGNOSIS — N186 End stage renal disease: Secondary | ICD-10-CM | POA: Diagnosis not present

## 2019-07-31 DIAGNOSIS — E8770 Fluid overload, unspecified: Secondary | ICD-10-CM | POA: Diagnosis not present

## 2019-07-31 DIAGNOSIS — Z992 Dependence on renal dialysis: Secondary | ICD-10-CM | POA: Diagnosis not present

## 2019-08-01 DIAGNOSIS — E8770 Fluid overload, unspecified: Secondary | ICD-10-CM | POA: Diagnosis not present

## 2019-08-01 DIAGNOSIS — I120 Hypertensive chronic kidney disease with stage 5 chronic kidney disease or end stage renal disease: Secondary | ICD-10-CM | POA: Diagnosis not present

## 2019-08-01 DIAGNOSIS — E875 Hyperkalemia: Secondary | ICD-10-CM | POA: Diagnosis not present

## 2019-08-01 DIAGNOSIS — N186 End stage renal disease: Secondary | ICD-10-CM | POA: Diagnosis not present

## 2019-08-01 DIAGNOSIS — N2581 Secondary hyperparathyroidism of renal origin: Secondary | ICD-10-CM | POA: Diagnosis not present

## 2019-08-01 DIAGNOSIS — Z992 Dependence on renal dialysis: Secondary | ICD-10-CM | POA: Diagnosis not present

## 2019-08-04 DIAGNOSIS — I120 Hypertensive chronic kidney disease with stage 5 chronic kidney disease or end stage renal disease: Secondary | ICD-10-CM | POA: Diagnosis not present

## 2019-08-04 DIAGNOSIS — E8770 Fluid overload, unspecified: Secondary | ICD-10-CM | POA: Diagnosis not present

## 2019-08-04 DIAGNOSIS — E875 Hyperkalemia: Secondary | ICD-10-CM | POA: Diagnosis not present

## 2019-08-04 DIAGNOSIS — Z992 Dependence on renal dialysis: Secondary | ICD-10-CM | POA: Diagnosis not present

## 2019-08-04 DIAGNOSIS — N186 End stage renal disease: Secondary | ICD-10-CM | POA: Diagnosis not present

## 2019-08-04 DIAGNOSIS — N2581 Secondary hyperparathyroidism of renal origin: Secondary | ICD-10-CM | POA: Diagnosis not present

## 2019-08-05 DIAGNOSIS — N186 End stage renal disease: Secondary | ICD-10-CM | POA: Diagnosis not present

## 2019-08-05 DIAGNOSIS — E8770 Fluid overload, unspecified: Secondary | ICD-10-CM | POA: Diagnosis not present

## 2019-08-05 DIAGNOSIS — I120 Hypertensive chronic kidney disease with stage 5 chronic kidney disease or end stage renal disease: Secondary | ICD-10-CM | POA: Diagnosis not present

## 2019-08-05 DIAGNOSIS — N2581 Secondary hyperparathyroidism of renal origin: Secondary | ICD-10-CM | POA: Diagnosis not present

## 2019-08-05 DIAGNOSIS — Z992 Dependence on renal dialysis: Secondary | ICD-10-CM | POA: Diagnosis not present

## 2019-08-05 DIAGNOSIS — E875 Hyperkalemia: Secondary | ICD-10-CM | POA: Diagnosis not present

## 2019-08-07 DIAGNOSIS — N2581 Secondary hyperparathyroidism of renal origin: Secondary | ICD-10-CM | POA: Diagnosis not present

## 2019-08-07 DIAGNOSIS — E875 Hyperkalemia: Secondary | ICD-10-CM | POA: Diagnosis not present

## 2019-08-07 DIAGNOSIS — I120 Hypertensive chronic kidney disease with stage 5 chronic kidney disease or end stage renal disease: Secondary | ICD-10-CM | POA: Diagnosis not present

## 2019-08-07 DIAGNOSIS — N186 End stage renal disease: Secondary | ICD-10-CM | POA: Diagnosis not present

## 2019-08-07 DIAGNOSIS — E8770 Fluid overload, unspecified: Secondary | ICD-10-CM | POA: Diagnosis not present

## 2019-08-07 DIAGNOSIS — Z992 Dependence on renal dialysis: Secondary | ICD-10-CM | POA: Diagnosis not present

## 2019-08-08 DIAGNOSIS — Z992 Dependence on renal dialysis: Secondary | ICD-10-CM | POA: Diagnosis not present

## 2019-08-08 DIAGNOSIS — N186 End stage renal disease: Secondary | ICD-10-CM | POA: Diagnosis not present

## 2019-08-08 DIAGNOSIS — I120 Hypertensive chronic kidney disease with stage 5 chronic kidney disease or end stage renal disease: Secondary | ICD-10-CM | POA: Diagnosis not present

## 2019-08-08 DIAGNOSIS — N2581 Secondary hyperparathyroidism of renal origin: Secondary | ICD-10-CM | POA: Diagnosis not present

## 2019-08-08 DIAGNOSIS — E8770 Fluid overload, unspecified: Secondary | ICD-10-CM | POA: Diagnosis not present

## 2019-08-08 DIAGNOSIS — E875 Hyperkalemia: Secondary | ICD-10-CM | POA: Diagnosis not present

## 2019-08-11 DIAGNOSIS — I120 Hypertensive chronic kidney disease with stage 5 chronic kidney disease or end stage renal disease: Secondary | ICD-10-CM | POA: Diagnosis not present

## 2019-08-11 DIAGNOSIS — E875 Hyperkalemia: Secondary | ICD-10-CM | POA: Diagnosis not present

## 2019-08-11 DIAGNOSIS — Z992 Dependence on renal dialysis: Secondary | ICD-10-CM | POA: Diagnosis not present

## 2019-08-11 DIAGNOSIS — N2581 Secondary hyperparathyroidism of renal origin: Secondary | ICD-10-CM | POA: Diagnosis not present

## 2019-08-11 DIAGNOSIS — N186 End stage renal disease: Secondary | ICD-10-CM | POA: Diagnosis not present

## 2019-08-11 DIAGNOSIS — E8770 Fluid overload, unspecified: Secondary | ICD-10-CM | POA: Diagnosis not present

## 2019-08-12 DIAGNOSIS — I120 Hypertensive chronic kidney disease with stage 5 chronic kidney disease or end stage renal disease: Secondary | ICD-10-CM | POA: Diagnosis not present

## 2019-08-12 DIAGNOSIS — N2581 Secondary hyperparathyroidism of renal origin: Secondary | ICD-10-CM | POA: Diagnosis not present

## 2019-08-12 DIAGNOSIS — E8770 Fluid overload, unspecified: Secondary | ICD-10-CM | POA: Diagnosis not present

## 2019-08-12 DIAGNOSIS — Z992 Dependence on renal dialysis: Secondary | ICD-10-CM | POA: Diagnosis not present

## 2019-08-12 DIAGNOSIS — N186 End stage renal disease: Secondary | ICD-10-CM | POA: Diagnosis not present

## 2019-08-12 DIAGNOSIS — E875 Hyperkalemia: Secondary | ICD-10-CM | POA: Diagnosis not present

## 2019-08-14 DIAGNOSIS — E875 Hyperkalemia: Secondary | ICD-10-CM | POA: Diagnosis not present

## 2019-08-14 DIAGNOSIS — I120 Hypertensive chronic kidney disease with stage 5 chronic kidney disease or end stage renal disease: Secondary | ICD-10-CM | POA: Diagnosis not present

## 2019-08-14 DIAGNOSIS — N186 End stage renal disease: Secondary | ICD-10-CM | POA: Diagnosis not present

## 2019-08-14 DIAGNOSIS — N2581 Secondary hyperparathyroidism of renal origin: Secondary | ICD-10-CM | POA: Diagnosis not present

## 2019-08-14 DIAGNOSIS — Z992 Dependence on renal dialysis: Secondary | ICD-10-CM | POA: Diagnosis not present

## 2019-08-14 DIAGNOSIS — E8770 Fluid overload, unspecified: Secondary | ICD-10-CM | POA: Diagnosis not present

## 2019-08-15 DIAGNOSIS — I120 Hypertensive chronic kidney disease with stage 5 chronic kidney disease or end stage renal disease: Secondary | ICD-10-CM | POA: Diagnosis not present

## 2019-08-15 DIAGNOSIS — Z992 Dependence on renal dialysis: Secondary | ICD-10-CM | POA: Diagnosis not present

## 2019-08-15 DIAGNOSIS — N2581 Secondary hyperparathyroidism of renal origin: Secondary | ICD-10-CM | POA: Diagnosis not present

## 2019-08-15 DIAGNOSIS — N186 End stage renal disease: Secondary | ICD-10-CM | POA: Diagnosis not present

## 2019-08-15 DIAGNOSIS — E8770 Fluid overload, unspecified: Secondary | ICD-10-CM | POA: Diagnosis not present

## 2019-08-15 DIAGNOSIS — E875 Hyperkalemia: Secondary | ICD-10-CM | POA: Diagnosis not present

## 2019-08-18 DIAGNOSIS — N186 End stage renal disease: Secondary | ICD-10-CM | POA: Diagnosis not present

## 2019-08-18 DIAGNOSIS — E875 Hyperkalemia: Secondary | ICD-10-CM | POA: Diagnosis not present

## 2019-08-18 DIAGNOSIS — N2581 Secondary hyperparathyroidism of renal origin: Secondary | ICD-10-CM | POA: Diagnosis not present

## 2019-08-18 DIAGNOSIS — E8770 Fluid overload, unspecified: Secondary | ICD-10-CM | POA: Diagnosis not present

## 2019-08-18 DIAGNOSIS — Z992 Dependence on renal dialysis: Secondary | ICD-10-CM | POA: Diagnosis not present

## 2019-08-18 DIAGNOSIS — I120 Hypertensive chronic kidney disease with stage 5 chronic kidney disease or end stage renal disease: Secondary | ICD-10-CM | POA: Diagnosis not present

## 2019-08-19 DIAGNOSIS — N186 End stage renal disease: Secondary | ICD-10-CM | POA: Diagnosis not present

## 2019-08-19 DIAGNOSIS — Z992 Dependence on renal dialysis: Secondary | ICD-10-CM | POA: Diagnosis not present

## 2019-08-19 DIAGNOSIS — I120 Hypertensive chronic kidney disease with stage 5 chronic kidney disease or end stage renal disease: Secondary | ICD-10-CM | POA: Diagnosis not present

## 2019-08-19 DIAGNOSIS — E875 Hyperkalemia: Secondary | ICD-10-CM | POA: Diagnosis not present

## 2019-08-19 DIAGNOSIS — E8770 Fluid overload, unspecified: Secondary | ICD-10-CM | POA: Diagnosis not present

## 2019-08-19 DIAGNOSIS — N2581 Secondary hyperparathyroidism of renal origin: Secondary | ICD-10-CM | POA: Diagnosis not present

## 2019-08-21 DIAGNOSIS — N186 End stage renal disease: Secondary | ICD-10-CM | POA: Diagnosis not present

## 2019-08-21 DIAGNOSIS — E875 Hyperkalemia: Secondary | ICD-10-CM | POA: Diagnosis not present

## 2019-08-21 DIAGNOSIS — E8770 Fluid overload, unspecified: Secondary | ICD-10-CM | POA: Diagnosis not present

## 2019-08-21 DIAGNOSIS — N2581 Secondary hyperparathyroidism of renal origin: Secondary | ICD-10-CM | POA: Diagnosis not present

## 2019-08-21 DIAGNOSIS — I120 Hypertensive chronic kidney disease with stage 5 chronic kidney disease or end stage renal disease: Secondary | ICD-10-CM | POA: Diagnosis not present

## 2019-08-21 DIAGNOSIS — Z992 Dependence on renal dialysis: Secondary | ICD-10-CM | POA: Diagnosis not present

## 2019-08-22 DIAGNOSIS — E875 Hyperkalemia: Secondary | ICD-10-CM | POA: Diagnosis not present

## 2019-08-22 DIAGNOSIS — N2581 Secondary hyperparathyroidism of renal origin: Secondary | ICD-10-CM | POA: Diagnosis not present

## 2019-08-22 DIAGNOSIS — I120 Hypertensive chronic kidney disease with stage 5 chronic kidney disease or end stage renal disease: Secondary | ICD-10-CM | POA: Diagnosis not present

## 2019-08-22 DIAGNOSIS — N186 End stage renal disease: Secondary | ICD-10-CM | POA: Diagnosis not present

## 2019-08-22 DIAGNOSIS — Z992 Dependence on renal dialysis: Secondary | ICD-10-CM | POA: Diagnosis not present

## 2019-08-22 DIAGNOSIS — E8770 Fluid overload, unspecified: Secondary | ICD-10-CM | POA: Diagnosis not present

## 2019-08-25 ENCOUNTER — Other Ambulatory Visit: Payer: Self-pay | Admitting: Nephrology

## 2019-08-25 ENCOUNTER — Encounter: Payer: Self-pay | Admitting: Internal Medicine

## 2019-08-25 DIAGNOSIS — N2581 Secondary hyperparathyroidism of renal origin: Secondary | ICD-10-CM | POA: Diagnosis not present

## 2019-08-25 DIAGNOSIS — I120 Hypertensive chronic kidney disease with stage 5 chronic kidney disease or end stage renal disease: Secondary | ICD-10-CM | POA: Diagnosis not present

## 2019-08-25 DIAGNOSIS — E875 Hyperkalemia: Secondary | ICD-10-CM | POA: Diagnosis not present

## 2019-08-25 DIAGNOSIS — N186 End stage renal disease: Secondary | ICD-10-CM | POA: Diagnosis not present

## 2019-08-25 DIAGNOSIS — N185 Chronic kidney disease, stage 5: Secondary | ICD-10-CM

## 2019-08-25 DIAGNOSIS — E8770 Fluid overload, unspecified: Secondary | ICD-10-CM | POA: Diagnosis not present

## 2019-08-25 DIAGNOSIS — Z992 Dependence on renal dialysis: Secondary | ICD-10-CM | POA: Diagnosis not present

## 2019-08-26 DIAGNOSIS — Z992 Dependence on renal dialysis: Secondary | ICD-10-CM | POA: Diagnosis not present

## 2019-08-26 DIAGNOSIS — I120 Hypertensive chronic kidney disease with stage 5 chronic kidney disease or end stage renal disease: Secondary | ICD-10-CM | POA: Diagnosis not present

## 2019-08-26 DIAGNOSIS — N2581 Secondary hyperparathyroidism of renal origin: Secondary | ICD-10-CM | POA: Diagnosis not present

## 2019-08-26 DIAGNOSIS — E8770 Fluid overload, unspecified: Secondary | ICD-10-CM | POA: Diagnosis not present

## 2019-08-26 DIAGNOSIS — E875 Hyperkalemia: Secondary | ICD-10-CM | POA: Diagnosis not present

## 2019-08-26 DIAGNOSIS — N186 End stage renal disease: Secondary | ICD-10-CM | POA: Diagnosis not present

## 2019-08-27 DIAGNOSIS — Z992 Dependence on renal dialysis: Secondary | ICD-10-CM | POA: Diagnosis not present

## 2019-08-27 DIAGNOSIS — N186 End stage renal disease: Secondary | ICD-10-CM | POA: Diagnosis not present

## 2019-08-27 DIAGNOSIS — E8770 Fluid overload, unspecified: Secondary | ICD-10-CM | POA: Diagnosis not present

## 2019-08-27 DIAGNOSIS — E875 Hyperkalemia: Secondary | ICD-10-CM | POA: Diagnosis not present

## 2019-08-27 DIAGNOSIS — N2581 Secondary hyperparathyroidism of renal origin: Secondary | ICD-10-CM | POA: Diagnosis not present

## 2019-08-27 DIAGNOSIS — I120 Hypertensive chronic kidney disease with stage 5 chronic kidney disease or end stage renal disease: Secondary | ICD-10-CM | POA: Diagnosis not present

## 2019-08-28 DIAGNOSIS — E875 Hyperkalemia: Secondary | ICD-10-CM | POA: Diagnosis not present

## 2019-08-28 DIAGNOSIS — Z992 Dependence on renal dialysis: Secondary | ICD-10-CM | POA: Diagnosis not present

## 2019-08-28 DIAGNOSIS — N2581 Secondary hyperparathyroidism of renal origin: Secondary | ICD-10-CM | POA: Diagnosis not present

## 2019-08-28 DIAGNOSIS — E8770 Fluid overload, unspecified: Secondary | ICD-10-CM | POA: Diagnosis not present

## 2019-08-28 DIAGNOSIS — N186 End stage renal disease: Secondary | ICD-10-CM | POA: Diagnosis not present

## 2019-08-28 DIAGNOSIS — I120 Hypertensive chronic kidney disease with stage 5 chronic kidney disease or end stage renal disease: Secondary | ICD-10-CM | POA: Diagnosis not present

## 2019-08-29 DIAGNOSIS — Z992 Dependence on renal dialysis: Secondary | ICD-10-CM | POA: Diagnosis not present

## 2019-08-29 DIAGNOSIS — E875 Hyperkalemia: Secondary | ICD-10-CM | POA: Diagnosis not present

## 2019-08-29 DIAGNOSIS — I120 Hypertensive chronic kidney disease with stage 5 chronic kidney disease or end stage renal disease: Secondary | ICD-10-CM | POA: Diagnosis not present

## 2019-08-29 DIAGNOSIS — I158 Other secondary hypertension: Secondary | ICD-10-CM | POA: Diagnosis not present

## 2019-08-29 DIAGNOSIS — E8770 Fluid overload, unspecified: Secondary | ICD-10-CM | POA: Diagnosis not present

## 2019-08-29 DIAGNOSIS — N2581 Secondary hyperparathyroidism of renal origin: Secondary | ICD-10-CM | POA: Diagnosis not present

## 2019-08-29 DIAGNOSIS — N186 End stage renal disease: Secondary | ICD-10-CM | POA: Diagnosis not present

## 2019-09-01 DIAGNOSIS — N186 End stage renal disease: Secondary | ICD-10-CM | POA: Diagnosis not present

## 2019-09-01 DIAGNOSIS — N2581 Secondary hyperparathyroidism of renal origin: Secondary | ICD-10-CM | POA: Diagnosis not present

## 2019-09-01 DIAGNOSIS — Z992 Dependence on renal dialysis: Secondary | ICD-10-CM | POA: Diagnosis not present

## 2019-09-03 DIAGNOSIS — Z992 Dependence on renal dialysis: Secondary | ICD-10-CM | POA: Diagnosis not present

## 2019-09-03 DIAGNOSIS — N186 End stage renal disease: Secondary | ICD-10-CM | POA: Diagnosis not present

## 2019-09-03 DIAGNOSIS — N2581 Secondary hyperparathyroidism of renal origin: Secondary | ICD-10-CM | POA: Diagnosis not present

## 2019-09-05 ENCOUNTER — Encounter: Payer: BC Managed Care – PPO | Admitting: Vascular Surgery

## 2019-09-05 DIAGNOSIS — N2581 Secondary hyperparathyroidism of renal origin: Secondary | ICD-10-CM | POA: Diagnosis not present

## 2019-09-05 DIAGNOSIS — Z992 Dependence on renal dialysis: Secondary | ICD-10-CM | POA: Diagnosis not present

## 2019-09-05 DIAGNOSIS — N186 End stage renal disease: Secondary | ICD-10-CM | POA: Diagnosis not present

## 2019-09-08 DIAGNOSIS — Z992 Dependence on renal dialysis: Secondary | ICD-10-CM | POA: Diagnosis not present

## 2019-09-08 DIAGNOSIS — N2581 Secondary hyperparathyroidism of renal origin: Secondary | ICD-10-CM | POA: Diagnosis not present

## 2019-09-08 DIAGNOSIS — N186 End stage renal disease: Secondary | ICD-10-CM | POA: Diagnosis not present

## 2019-09-10 ENCOUNTER — Other Ambulatory Visit: Payer: BC Managed Care – PPO

## 2019-09-10 DIAGNOSIS — N186 End stage renal disease: Secondary | ICD-10-CM | POA: Diagnosis not present

## 2019-09-10 DIAGNOSIS — Z992 Dependence on renal dialysis: Secondary | ICD-10-CM | POA: Diagnosis not present

## 2019-09-10 DIAGNOSIS — N2581 Secondary hyperparathyroidism of renal origin: Secondary | ICD-10-CM | POA: Diagnosis not present

## 2019-09-11 ENCOUNTER — Encounter: Payer: Self-pay | Admitting: Internal Medicine

## 2019-09-12 DIAGNOSIS — Z992 Dependence on renal dialysis: Secondary | ICD-10-CM | POA: Diagnosis not present

## 2019-09-12 DIAGNOSIS — N2581 Secondary hyperparathyroidism of renal origin: Secondary | ICD-10-CM | POA: Diagnosis not present

## 2019-09-12 DIAGNOSIS — N186 End stage renal disease: Secondary | ICD-10-CM | POA: Diagnosis not present

## 2019-09-15 ENCOUNTER — Other Ambulatory Visit: Payer: Self-pay | Admitting: Nephrology

## 2019-09-15 DIAGNOSIS — N186 End stage renal disease: Secondary | ICD-10-CM | POA: Diagnosis not present

## 2019-09-15 DIAGNOSIS — Z992 Dependence on renal dialysis: Secondary | ICD-10-CM | POA: Diagnosis not present

## 2019-09-15 DIAGNOSIS — N2581 Secondary hyperparathyroidism of renal origin: Secondary | ICD-10-CM | POA: Diagnosis not present

## 2019-09-16 ENCOUNTER — Other Ambulatory Visit: Payer: BC Managed Care – PPO

## 2019-09-17 DIAGNOSIS — Z992 Dependence on renal dialysis: Secondary | ICD-10-CM | POA: Diagnosis not present

## 2019-09-17 DIAGNOSIS — N2581 Secondary hyperparathyroidism of renal origin: Secondary | ICD-10-CM | POA: Diagnosis not present

## 2019-09-17 DIAGNOSIS — N186 End stage renal disease: Secondary | ICD-10-CM | POA: Diagnosis not present

## 2019-09-18 ENCOUNTER — Encounter: Payer: Self-pay | Admitting: Internal Medicine

## 2019-09-18 ENCOUNTER — Ambulatory Visit (INDEPENDENT_AMBULATORY_CARE_PROVIDER_SITE_OTHER): Payer: BC Managed Care – PPO | Admitting: Internal Medicine

## 2019-09-18 ENCOUNTER — Other Ambulatory Visit: Payer: Self-pay

## 2019-09-18 VITALS — BP 100/62 | HR 85 | Temp 98.4°F | Ht 65.5 in | Wt 203.4 lb

## 2019-09-18 DIAGNOSIS — R569 Unspecified convulsions: Secondary | ICD-10-CM

## 2019-09-18 DIAGNOSIS — Z0000 Encounter for general adult medical examination without abnormal findings: Secondary | ICD-10-CM | POA: Diagnosis not present

## 2019-09-18 DIAGNOSIS — Z992 Dependence on renal dialysis: Secondary | ICD-10-CM

## 2019-09-18 DIAGNOSIS — I10 Essential (primary) hypertension: Secondary | ICD-10-CM

## 2019-09-18 DIAGNOSIS — Z1211 Encounter for screening for malignant neoplasm of colon: Secondary | ICD-10-CM

## 2019-09-18 DIAGNOSIS — I770 Arteriovenous fistula, acquired: Secondary | ICD-10-CM

## 2019-09-18 LAB — POC HEMOCCULT BLD/STL (OFFICE/1-CARD/DIAGNOSTIC): Fecal Occult Blood, POC: NEGATIVE

## 2019-09-18 NOTE — Patient Instructions (Signed)
Vaya a [facility] para hacerse su laboratorio Cuidados preventivos en los hombres de 60 a 60 aos de edad Preventive Care 77-60 Years Old, Male [place] cuidados preventivos hacen referencia a las opciones en cuanto al estilo de vida y a las visitas al mdico, las cuales pueden promover la salud y Musician. Esto puede comprender lo siguiente:  Un examen fsico anual. Esto tambin se conoce como control de bienestar anual.  Exmenes dentales y oculares de [place] regular.  Vacunas.  Estudios para Engineer, building services.  Opciones saludables de estilo de vida, como seguir una dieta saludable, hacer ejercicio regularmente, no usar drogas ni productos que contengan nicotina y tabaco, y limitar el consumo de alcohol. Qu puedo esperar para mi visita de cuidado preventivo? 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 Estos datos se pueden usar para calcular el ndice de masa corporal ([place]), una medicin que indica si usted tiene un peso saludable.  Frecuencia cardaca y presin arterial.  Piel para detectar manchas anormales. Asesoramiento El mdico puede hacerle preguntas sobre lo siguiente:  Consumo de tabaco, alcohol y drogas.  Bienestar emocional.  Bienestar en el hogar y sus relaciones personales.  Actividad sexual.  Hbitos de alimentacin.  Trabajo y [place] laboral. Sander Nephew vacunas necesito?  [place] antigripal  Se recomienda aplicarse esta vacuna todos los [place]. Vacuna contra el ttanos, la difteria y la tos ferina (Tdap)  Es posible que tenga que aplicarse un refuerzo contra el ttanos y la difteria (DT) cada 10aos. Vacuna contra la varicela  Es posible que tenga que aplicrsela si an no la recibi. Vacuna contra el herpes zster (culebrilla)  Es posible que la necesite despus de los 60 aos de [place]. Vacuna contra el sarampin, la rubola y las paperas ([place])  Es posible que necesite aplicarse al menos una dosis de la vacuna  SRP si naci despus de [id number]. Tambin es posible que necesite una segunda dosis. Vacuna antineumoccica conjugada (PCV13)  Puede necesitar esta vacuna si tiene determinadas enfermedades y no se vacun anteriormente. Edward Jolly antineumoccica de polisacridos (PPSV23)  Quizs tenga que aplicarse una o dos dosis si fuma o si tiene determinadas afecciones. Edward Jolly antimeningoccica conjugada (MenACWY)  Puede necesitar esta vacuna si tiene determinadas afecciones. Vacuna contra la hepatitis A  Es posible que necesite esta vacuna si tiene ciertas afecciones o si viaja o trabaja en lugares en los que podra estar expuesto a la hepatitis A. Vacuna contra la hepatitis B  Es posible que necesite esta vacuna si tiene ciertas afecciones o si viaja o trabaja en lugares en los que podra estar expuesto a la hepatitis B. Vacuna antihaemophilus influenzae tipo B ([place])  Es posible que necesite esta vacuna si tiene algunos factores de [place]. Vacuna contra el virus del papiloma humano (VPH)  Si el mdico se lo recomienda, Research scientist (physical sciences) tres dosis a lo largo de 6 meses. Puede recibir las vacunas en forma de dosis individuales o en forma de dos o ms vacunas juntas en la misma inyeccin (vacunas combinadas). Hable con su mdico [employer] y beneficios de las vacunas combinadas. Qu pruebas necesito? Anlisis de [employer] de lpidos y colesterol. Estos se pueden verificar cada 5 aos o, con ms frecuencia, si usted tiene ms de 60 aos de edad.  Anlisis de hepatitisC.  Anlisis de hepatitisB. Pruebas de deteccin  Pruebas de deteccin de cncer de pulmn. Es posible que se le realice esta prueba de deteccin a Proofreader de los 60 aos  de edad, si ha fumado durante 30 aos un paquete diario y sigue fumando o dej el hbito en algn momento en los ltimos 15 aos.  Examen de deteccin del cncer de prstata. Las recomendaciones variarn segn sus antecedentes familiares y Charity fundraiser.  Pruebas de Programme researcher, broadcasting/film/video de Surveyor, minerals. Todos los adultos a partir de los 60 aos de edad y [place] 60 aos de edad deben hacerse esta prueba de deteccin. El mdico puede recomendarle las pruebas de deteccin a partir de los 12 aos de edad si corre un mayor riesgo. Le realizarn pruebas cada 1 a 10 aos, segn los [place] y el tipo de prueba de Programme researcher, broadcasting/film/video.  Pruebas de deteccin de la diabetes. Esto se Set designer un control del azcar en la sangre (glucosa) despus de no haber comido durante un periodo de tiempo (ayuno). Es posible que se le realice esta prueba cada 1 a 3 aos.  Anlisis de enfermedades de transmisin sexual (ETS). Siga estas instrucciones en su casa: Comida y bebida  Siga una dieta que incluya frutas y verduras frescas, cereales integrales, protenas magras y productos lcteos descremados.  Tome los suplementos vitamnicos y [employer] se lo haya indicado el mdico.  No beba alcohol si el mdico se lo prohbe.  Si bebe alcohol: ? Limite la cantidad que consume de 0 a 2 medidas por da. ? Est atento a la cantidad de alcohol que hay en las bebidas que toma. En los [place], una medida equivale a una botella de cerveza de 12oz (340ml), un vaso de vino de 5oz (171ml) o un vaso de una bebida alcohlica de alta graduacin de 1oz (61ml). Estilo de [employer] y las encas a diario.  Mantngase activo. Haga al menos 34minutos de ejercicio 5o ms [employer].  No consuma ningn producto que contenga nicotina o tabaco, como cigarrillos, cigarrillos electrnicos y tabaco de Higher education careers adviser. Si necesita ayuda para dejar de fumar, consulte al mdico.  Si es sexualmente activo, practique sexo seguro. Use un condn u otra forma de proteccin para prevenir las ITS (infecciones de transmisin sexual).  Hable con el mdico acerca de tomar una dosis baja de aspirina o estatina todos los das a partir de los 60 aos. Cundo  volver?  Acuda al mdico una vez al ao para una visita de control.  Pregntele al mdico con qu frecuencia debe realizarse un control de la vista y los dientes.  Mantenga su esquema de vacunacin al da. Esta informacin no tiene Marine scientist el consejo del mdico. Asegrese de hacerle al mdico cualquier pregunta que tenga. Document Revised: 05/10/2018 Document Reviewed: 05/10/2018 Elsevier Patient Education  [place].

## 2019-09-18 NOTE — Progress Notes (Signed)
This visit occurred during the SARS-CoV-2 public health emergency.  Safety protocols were in place, including screening questions prior to the visit, additional usage of staff PPE, and extensive cleaning of exam room while observing appropriate contact time as indicated for disinfecting solutions.  Subjective:     Patient ID: Dylan [name] , male    DOB: [birth year] , 59 y.o.   MRN: [id number]   Chief Complaint  Patient presents with  . Annual Exam    HPI  Here for yearly physical. Is awaiting to possibly get kidney transplant.    Past Medical History:  Diagnosis Date  . Anemia   . Bilateral chronic knee pain 07/04/2018  . Chronic pain of left ankle 07/04/2018  . ESRD (end stage renal disease) ([place]) 03/29/2016  . Glucosuria 07/04/2018  . History of anemia due to CKD 03/29/2016  . Hyperkalemia, diminished renal excretion 03/29/2016  . Hypertension   . Hypertensive nephropathy 06/11/2018  . 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 Renal disorder   . Uremia of renal origin 03/29/2016     Family History  Problem Relation Age of Onset  . Breast cancer Mother   . Prostate cancer Father   . Diabetes Brother   . Breast cancer Sister   . Colon cancer Neg Hx   . Esophageal cancer Neg Hx   . Inflammatory bowel disease Neg Hx   . Liver disease Neg Hx   . Pancreatic cancer Neg Hx   . Rectal cancer Neg Hx   . Stomach cancer Neg Hx      Current Outpatient Medications:  .  calcitRIOL (ROCALTROL) 0.25 MCG capsule, Take 1 capsule (0.25 mcg total) by mouth daily., Disp: 1 capsule, Rfl: 0 .  cinacalcet (SENSIPAR) 60 MG tablet, Take 1 tablet by mouth daily., Disp: , Rfl:  .  epoetin alfa (EPOGEN,PROCRIT) 4000 UNIT/ML injection, Inject 4,000 Units into the skin once a week. TUESDAYS, Disp: , Rfl:  .  multivitamin (RENA-VIT) TABS tablet, Take 1 tablet by mouth at bedtime., Disp: 30 tablet, Rfl: 0 .  sucroferric oxyhydroxide (VELPHORO) 500 MG chewable tablet,  Chew 1 tablet (500 mg total) by mouth 3 (three) times daily with meals., Disp: 1 tablet, Rfl: 0 .  amLODipine (NORVASC) 5 MG tablet, On hold (Patient not taking: Reported on 09/18/2019), Disp: 1 tablet, Rfl: 0 .  sildenafil (VIAGRA) 50 MG tablet, Take 1 tablet (50 mg total) by mouth as needed for erectile dysfunction., Disp: 1 tablet, Rfl: 0   No Known Allergies   Review of Systems  Knee aches with prolonged standing  Today's Vitals   09/18/19 1441  BP: 100/62  Pulse: 85  Temp: 98.4 F (36.9 C)  TempSrc: Oral  SpO2: 96%  Weight: 203 lb 6.4 oz (92.3 kg)  Height: 5' 5.5" (1.664 m)   Body mass index is 33.33 kg/m.   Objective:  Physical Exam  BP 100/62 (BP Location: Left Arm, Patient Position: Sitting, Cuff Size: Normal)   Pulse 85   Temp 98.4 F (36.9 C) (Oral)   Ht 5' 5.5" (1.664 m)   Wt 203 lb 6.4 oz (92.3 kg)   SpO2 96%   BMI 33.33 kg/m   General Appearance:    Alert, cooperative, no distress, appears stated age  Head:    Normocephalic, without obvious abnormality, atraumatic  Eyes:    PERRL, conjunctiva/corneas clear, EOM's intact, fundi    benign, both eyes       Ears:    Normal TM's  and external ear canals, both ears  Nose:   Nares normal, septum midline, mucosa normal, no drainage   or sinus tenderness  Throat:   Lips, mucosa, and tongue normal; teeth and gums normal  Neck:   Supple, symmetrical, trachea midline, no adenopathy;       thyroid:  No enlargement/tenderness/nodules; no carotid   bruit   Back:     Symmetric, no curvature, ROM normal, no CVA tenderness  Lungs:     Clear to auscultation bilaterally, respirations unlabored  Chest wall:    No tenderness or deformity  Heart:    Regular rate and rhythm, S1 and S2 normal, no murmur, rub   or gallop  Abdomen:     Soft, non-tender, bowel sounds active all four quadrants,    no masses, no organomegaly  Genitalia:    Normal male without lesion, discharge or tenderness  Rectal:    Normal tone, normal prostate,  no masses or tenderness;   guaiac negative stool  Extremities:   Extremities normal, atraumatic, no cyanosis or edema. A-V fistula present on L upper arm.   Pulses:   2+ and symmetric all extremities  Skin:   Skin color, texture, turgor normal, no rashes or lesions  Lymph nodes:   Cervical, supraclavicular, and axillary nodes normal  Neurologic:   CNII-XII intact. Normal strength, sensation and reflexes      throughout  EKG- sinus rhythm, WNL    Assessment And Plan:       SYLVIA RODRIGUEZ-SOUTHWORTH, PA-C    THE PATIENT IS ENCOURAGED TO PRACTICE SOCIAL DISTANCING DUE TO THE COVID-19 PANDEMIC.

## 2019-09-19 DIAGNOSIS — N2581 Secondary hyperparathyroidism of renal origin: Secondary | ICD-10-CM | POA: Diagnosis not present

## 2019-09-19 DIAGNOSIS — N186 End stage renal disease: Secondary | ICD-10-CM | POA: Diagnosis not present

## 2019-09-19 DIAGNOSIS — Z992 Dependence on renal dialysis: Secondary | ICD-10-CM | POA: Diagnosis not present

## 2019-09-22 DIAGNOSIS — Z992 Dependence on renal dialysis: Secondary | ICD-10-CM | POA: Diagnosis not present

## 2019-09-22 DIAGNOSIS — N186 End stage renal disease: Secondary | ICD-10-CM | POA: Diagnosis not present

## 2019-09-22 DIAGNOSIS — N2581 Secondary hyperparathyroidism of renal origin: Secondary | ICD-10-CM | POA: Diagnosis not present

## 2019-09-24 DIAGNOSIS — N186 End stage renal disease: Secondary | ICD-10-CM | POA: Diagnosis not present

## 2019-09-24 DIAGNOSIS — N2581 Secondary hyperparathyroidism of renal origin: Secondary | ICD-10-CM | POA: Diagnosis not present

## 2019-09-24 DIAGNOSIS — Z992 Dependence on renal dialysis: Secondary | ICD-10-CM | POA: Diagnosis not present

## 2019-09-26 ENCOUNTER — Encounter: Payer: BC Managed Care – PPO | Admitting: Vascular Surgery

## 2019-09-26 DIAGNOSIS — N186 End stage renal disease: Secondary | ICD-10-CM | POA: Diagnosis not present

## 2019-09-26 DIAGNOSIS — N2581 Secondary hyperparathyroidism of renal origin: Secondary | ICD-10-CM | POA: Diagnosis not present

## 2019-09-26 DIAGNOSIS — Z992 Dependence on renal dialysis: Secondary | ICD-10-CM | POA: Diagnosis not present

## 2019-09-29 DIAGNOSIS — N2581 Secondary hyperparathyroidism of renal origin: Secondary | ICD-10-CM | POA: Diagnosis not present

## 2019-09-29 DIAGNOSIS — I158 Other secondary hypertension: Secondary | ICD-10-CM | POA: Diagnosis not present

## 2019-09-29 DIAGNOSIS — Z992 Dependence on renal dialysis: Secondary | ICD-10-CM | POA: Diagnosis not present

## 2019-09-29 DIAGNOSIS — N186 End stage renal disease: Secondary | ICD-10-CM | POA: Diagnosis not present

## 2019-10-01 DIAGNOSIS — Z992 Dependence on renal dialysis: Secondary | ICD-10-CM | POA: Diagnosis not present

## 2019-10-01 DIAGNOSIS — N2581 Secondary hyperparathyroidism of renal origin: Secondary | ICD-10-CM | POA: Diagnosis not present

## 2019-10-01 DIAGNOSIS — N186 End stage renal disease: Secondary | ICD-10-CM | POA: Diagnosis not present

## 2019-10-03 DIAGNOSIS — N2581 Secondary hyperparathyroidism of renal origin: Secondary | ICD-10-CM | POA: Diagnosis not present

## 2019-10-03 DIAGNOSIS — N186 End stage renal disease: Secondary | ICD-10-CM | POA: Diagnosis not present

## 2019-10-03 DIAGNOSIS — Z992 Dependence on renal dialysis: Secondary | ICD-10-CM | POA: Diagnosis not present

## 2019-10-06 DIAGNOSIS — Z992 Dependence on renal dialysis: Secondary | ICD-10-CM | POA: Diagnosis not present

## 2019-10-06 DIAGNOSIS — N186 End stage renal disease: Secondary | ICD-10-CM | POA: Diagnosis not present

## 2019-10-06 DIAGNOSIS — N2581 Secondary hyperparathyroidism of renal origin: Secondary | ICD-10-CM | POA: Diagnosis not present

## 2019-10-08 DIAGNOSIS — N2581 Secondary hyperparathyroidism of renal origin: Secondary | ICD-10-CM | POA: Diagnosis not present

## 2019-10-08 DIAGNOSIS — N186 End stage renal disease: Secondary | ICD-10-CM | POA: Diagnosis not present

## 2019-10-08 DIAGNOSIS — Z992 Dependence on renal dialysis: Secondary | ICD-10-CM | POA: Diagnosis not present

## 2019-10-10 ENCOUNTER — Other Ambulatory Visit: Payer: Self-pay

## 2019-10-10 ENCOUNTER — Ambulatory Visit (INDEPENDENT_AMBULATORY_CARE_PROVIDER_SITE_OTHER): Payer: BC Managed Care – PPO | Admitting: Vascular Surgery

## 2019-10-10 ENCOUNTER — Encounter: Payer: Self-pay | Admitting: Vascular Surgery

## 2019-10-10 VITALS — BP 128/74 | HR 69 | Temp 98.0°F | Resp 20 | Ht 65.0 in | Wt 205.0 lb

## 2019-10-10 DIAGNOSIS — N186 End stage renal disease: Secondary | ICD-10-CM

## 2019-10-10 DIAGNOSIS — N2581 Secondary hyperparathyroidism of renal origin: Secondary | ICD-10-CM | POA: Diagnosis not present

## 2019-10-10 DIAGNOSIS — Z992 Dependence on renal dialysis: Secondary | ICD-10-CM | POA: Diagnosis not present

## 2019-10-10 NOTE — Progress Notes (Signed)
Patient ID: Dylan [name], male   DOB: Jul 26, 1960, 59 y.o.   MRN: [id number]  Reason for Consult: New Patient (Initial Visit)   Referred by Donato Heinz, MD  Subjective:     HPI:  Dylan [name] is a 59 y.o. male from [place] I placed a single stage basilic vein fistula in his left upper extremity a few years ago.  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  He denies any pain.  He denies any bleeding.  He states it is working for dialysis quite well.  At this time patient has no complaints of his left upper extremity.  Information obtained via interpreter  Past Medical History:  Diagnosis Date  . Anemia   . Bilateral chronic knee pain 07/04/2018  . Chronic pain of left ankle 07/04/2018  . ESRD (end stage renal disease) ([place]) 03/29/2016  . Glucosuria 07/04/2018  . History of anemia due to CKD 03/29/2016  . Hyperkalemia, diminished renal excretion 03/29/2016  . Hypertension   . Hypertensive nephropathy 06/11/2018  . 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 Renal disorder   . Uremia of renal origin 03/29/2016   Family History  Problem Relation Age of Onset  . Breast cancer Mother   . Prostate cancer Father   . Diabetes Brother   . Breast cancer Sister   . Colon cancer Neg Hx   . Esophageal cancer Neg Hx   . Inflammatory bowel disease Neg Hx   . Liver disease Neg Hx   . Pancreatic cancer Neg Hx   . Rectal cancer Neg Hx   . Stomach cancer Neg Hx    Past Surgical History:  Procedure Laterality Date  . BASCILIC VEIN TRANSPOSITION Left 04/06/2016   Procedure: Left Arm Single Stage Bascilic Vein Transposition;  Surgeon: Waynetta Sandy, MD;  Location: [place];  Service: Vascular;  Laterality: Left;  . INSERTION OF DIALYSIS CATHETER Right 04/06/2016   Procedure: INSERTION OF Tunneled DIALYSIS CATHETER RIGHT INTERNAL JUGULAR;  Surgeon: Waynetta Sandy, MD;  Location: [place] [facility];  Service: Vascular;  Laterality: Right;  . IR  GENERIC HISTORICAL  06/26/2016   IR REMOVAL TUN CV CATH W/O FL 06/26/2016 Arne Cleveland, MD [facility]-INTERV RAD  . TOOTH EXTRACTION  2020    Short Social History:  Social History   Tobacco Use  . Smoking status: Never Smoker  . Smokeless tobacco: Never Used  Substance Use Topics  . Alcohol use: No    No Known Allergies  Current Outpatient Medications  Medication Sig Dispense Refill  . calcitRIOL (ROCALTROL) 0.25 MCG capsule Take 1 capsule (0.25 mcg total) by mouth daily. 1 capsule 0  . cinacalcet (SENSIPAR) 60 MG tablet Take 1 tablet by mouth daily.    Marland Kitchen FOSRENOL 500 MG chewable tablet Chew 500 mg by mouth 5 (five) times daily.    . multivitamin (RENA-VIT) TABS tablet Take 1 tablet by mouth at bedtime. 30 tablet 0  . sevelamer carbonate (RENVELA) 800 MG tablet Take by mouth.    . sucroferric oxyhydroxide (VELPHORO) 500 MG chewable tablet Chew 1 tablet (500 mg total) by mouth 3 (three) times daily with meals. 1 tablet 0   No current facility-administered medications for this visit.    Review of Systems  Constitutional:  Constitutional negative. HENT: HENT negative.  Eyes: Eyes negative.  Respiratory: Respiratory negative.  Cardiovascular: Cardiovascular negative.  GI: Gastrointestinal negative.  Musculoskeletal: Musculoskeletal negative.  Skin: Skin negative.  Neurological: Neurological negative. Hematologic: Hematologic/lymphatic negative.  Psychiatric: Psychiatric  negative.        Objective:  Objective   Vitals:   10/10/19 1441  BP: 128/74  Pulse: 69  Resp: 20  Temp: 98 F (36.7 C)  SpO2: 95%  Weight: 205 lb (93 kg)  Height: 5\' 5"  (1.651 m)   Body mass index is 34.11 kg/m.  Physical Exam HENT:     Head: Normocephalic.     Nose:     Comments: Wearing a mask  Eyes:     Pupils: Pupils are equal, round, and reactive to light.  Cardiovascular:     Rate and Rhythm: Normal rate.     Pulses: Normal pulses.  Pulmonary:     Effort: Pulmonary effort is normal.    Abdominal:     General: Abdomen is flat.     Palpations: Abdomen is soft.  Musculoskeletal:        General: No swelling. Normal range of motion.     Comments: Left upper arm fistula with 2 areas of pseudoaneurysm, thin skin there is a strong thrill in the entire fistula.  Skin:    General: Skin is warm.     Capillary Refill: Capillary refill takes less than 2 seconds.  Neurological:     General: No focal deficit present.     Mental Status: He is alert.  Psychiatric:        Mood and Affect: Mood normal.        Behavior: Behavior normal.        Thought Content: Thought content normal.        Judgment: Judgment normal.        Assessment/Plan:     59 year old male well-known to me from [place] I placed a single stage basilic vein fistula in his arm.  This has worked very well for dialysis he does have 2 areas of pseudoaneurysm but does not appear to have any complicating features.  At this time he can follow-up with me on an as-needed basis.  If there are any further concerns in the near future we can schedule him for fistulogram without any further follow-up prior.     Waynetta Sandy MD [facility]

## 2019-10-13 DIAGNOSIS — N2581 Secondary hyperparathyroidism of renal origin: Secondary | ICD-10-CM | POA: Diagnosis not present

## 2019-10-13 DIAGNOSIS — Z992 Dependence on renal dialysis: Secondary | ICD-10-CM | POA: Diagnosis not present

## 2019-10-13 DIAGNOSIS — N186 End stage renal disease: Secondary | ICD-10-CM | POA: Diagnosis not present

## 2019-10-15 DIAGNOSIS — N186 End stage renal disease: Secondary | ICD-10-CM | POA: Diagnosis not present

## 2019-10-15 DIAGNOSIS — N2581 Secondary hyperparathyroidism of renal origin: Secondary | ICD-10-CM | POA: Diagnosis not present

## 2019-10-15 DIAGNOSIS — Z992 Dependence on renal dialysis: Secondary | ICD-10-CM | POA: Diagnosis not present

## 2019-10-16 ENCOUNTER — Ambulatory Visit
Admission: RE | Admit: 2019-10-16 | Discharge: 2019-10-16 | Disposition: A | Discharge status: Home / Self Care | Payer: BC Managed Care – PPO | Source: Ambulatory Visit | Attending: Nephrology | Admitting: Nephrology

## 2019-10-16 ENCOUNTER — Other Ambulatory Visit: Payer: Self-pay

## 2019-10-16 DIAGNOSIS — N185 Chronic kidney disease, stage 5: Secondary | ICD-10-CM

## 2019-10-16 DIAGNOSIS — N189 Chronic kidney disease, unspecified: Secondary | ICD-10-CM | POA: Diagnosis not present

## 2019-10-16 DIAGNOSIS — N2889 Other specified disorders of kidney and ureter: Secondary | ICD-10-CM | POA: Diagnosis not present

## 2019-10-16 MED ORDER — IOPAMIDOL (ISOVUE-300) INJECTION 61%
100.0000 mL | Freq: Once | INTRAVENOUS | Status: AC | PRN
  Administered 2019-10-16: 100 mL via INTRAVENOUS

## 2019-10-17 DIAGNOSIS — N186 End stage renal disease: Secondary | ICD-10-CM | POA: Diagnosis not present

## 2019-10-17 DIAGNOSIS — N2581 Secondary hyperparathyroidism of renal origin: Secondary | ICD-10-CM | POA: Diagnosis not present

## 2019-10-17 DIAGNOSIS — Z992 Dependence on renal dialysis: Secondary | ICD-10-CM | POA: Diagnosis not present

## 2019-10-20 DIAGNOSIS — Z992 Dependence on renal dialysis: Secondary | ICD-10-CM | POA: Diagnosis not present

## 2019-10-20 DIAGNOSIS — N2581 Secondary hyperparathyroidism of renal origin: Secondary | ICD-10-CM | POA: Diagnosis not present

## 2019-10-20 DIAGNOSIS — N186 End stage renal disease: Secondary | ICD-10-CM | POA: Diagnosis not present

## 2019-10-22 DIAGNOSIS — Z992 Dependence on renal dialysis: Secondary | ICD-10-CM | POA: Diagnosis not present

## 2019-10-22 DIAGNOSIS — N2581 Secondary hyperparathyroidism of renal origin: Secondary | ICD-10-CM | POA: Diagnosis not present

## 2019-10-22 DIAGNOSIS — N186 End stage renal disease: Secondary | ICD-10-CM | POA: Diagnosis not present

## 2019-10-24 DIAGNOSIS — N2581 Secondary hyperparathyroidism of renal origin: Secondary | ICD-10-CM | POA: Diagnosis not present

## 2019-10-24 DIAGNOSIS — Z992 Dependence on renal dialysis: Secondary | ICD-10-CM | POA: Diagnosis not present

## 2019-10-24 DIAGNOSIS — N186 End stage renal disease: Secondary | ICD-10-CM | POA: Diagnosis not present

## 2019-10-27 DIAGNOSIS — Z992 Dependence on renal dialysis: Secondary | ICD-10-CM | POA: Diagnosis not present

## 2019-10-27 DIAGNOSIS — N186 End stage renal disease: Secondary | ICD-10-CM | POA: Diagnosis not present

## 2019-10-27 DIAGNOSIS — N2581 Secondary hyperparathyroidism of renal origin: Secondary | ICD-10-CM | POA: Diagnosis not present

## 2019-10-29 DIAGNOSIS — I158 Other secondary hypertension: Secondary | ICD-10-CM | POA: Diagnosis not present

## 2019-10-29 DIAGNOSIS — N186 End stage renal disease: Secondary | ICD-10-CM | POA: Diagnosis not present

## 2019-10-29 DIAGNOSIS — Z992 Dependence on renal dialysis: Secondary | ICD-10-CM | POA: Diagnosis not present

## 2019-10-29 DIAGNOSIS — N2581 Secondary hyperparathyroidism of renal origin: Secondary | ICD-10-CM | POA: Diagnosis not present

## 2019-10-31 DIAGNOSIS — N2581 Secondary hyperparathyroidism of renal origin: Secondary | ICD-10-CM | POA: Diagnosis not present

## 2019-10-31 DIAGNOSIS — Z992 Dependence on renal dialysis: Secondary | ICD-10-CM | POA: Diagnosis not present

## 2019-10-31 DIAGNOSIS — N186 End stage renal disease: Secondary | ICD-10-CM | POA: Diagnosis not present

## 2019-11-03 DIAGNOSIS — N186 End stage renal disease: Secondary | ICD-10-CM | POA: Diagnosis not present

## 2019-11-03 DIAGNOSIS — N2581 Secondary hyperparathyroidism of renal origin: Secondary | ICD-10-CM | POA: Diagnosis not present

## 2019-11-03 DIAGNOSIS — Z992 Dependence on renal dialysis: Secondary | ICD-10-CM | POA: Diagnosis not present

## 2019-11-05 DIAGNOSIS — Z992 Dependence on renal dialysis: Secondary | ICD-10-CM | POA: Diagnosis not present

## 2019-11-05 DIAGNOSIS — N186 End stage renal disease: Secondary | ICD-10-CM | POA: Diagnosis not present

## 2019-11-05 DIAGNOSIS — N2581 Secondary hyperparathyroidism of renal origin: Secondary | ICD-10-CM | POA: Diagnosis not present

## 2019-11-07 DIAGNOSIS — Z992 Dependence on renal dialysis: Secondary | ICD-10-CM | POA: Diagnosis not present

## 2019-11-07 DIAGNOSIS — N2581 Secondary hyperparathyroidism of renal origin: Secondary | ICD-10-CM | POA: Diagnosis not present

## 2019-11-07 DIAGNOSIS — N186 End stage renal disease: Secondary | ICD-10-CM | POA: Diagnosis not present

## 2019-11-10 DIAGNOSIS — N2581 Secondary hyperparathyroidism of renal origin: Secondary | ICD-10-CM | POA: Diagnosis not present

## 2019-11-10 DIAGNOSIS — N186 End stage renal disease: Secondary | ICD-10-CM | POA: Diagnosis not present

## 2019-11-10 DIAGNOSIS — Z992 Dependence on renal dialysis: Secondary | ICD-10-CM | POA: Diagnosis not present

## 2019-11-12 DIAGNOSIS — N186 End stage renal disease: Secondary | ICD-10-CM | POA: Diagnosis not present

## 2019-11-12 DIAGNOSIS — N2581 Secondary hyperparathyroidism of renal origin: Secondary | ICD-10-CM | POA: Diagnosis not present

## 2019-11-12 DIAGNOSIS — Z992 Dependence on renal dialysis: Secondary | ICD-10-CM | POA: Diagnosis not present

## 2019-11-14 DIAGNOSIS — Z992 Dependence on renal dialysis: Secondary | ICD-10-CM | POA: Diagnosis not present

## 2019-11-14 DIAGNOSIS — N186 End stage renal disease: Secondary | ICD-10-CM | POA: Diagnosis not present

## 2019-11-14 DIAGNOSIS — N2581 Secondary hyperparathyroidism of renal origin: Secondary | ICD-10-CM | POA: Diagnosis not present

## 2019-11-17 DIAGNOSIS — Z992 Dependence on renal dialysis: Secondary | ICD-10-CM | POA: Diagnosis not present

## 2019-11-17 DIAGNOSIS — Z7682 Awaiting organ transplant status: Secondary | ICD-10-CM | POA: Diagnosis not present

## 2019-11-17 DIAGNOSIS — N2581 Secondary hyperparathyroidism of renal origin: Secondary | ICD-10-CM | POA: Diagnosis not present

## 2019-11-17 DIAGNOSIS — N289 Disorder of kidney and ureter, unspecified: Secondary | ICD-10-CM | POA: Diagnosis not present

## 2019-11-17 DIAGNOSIS — N186 End stage renal disease: Secondary | ICD-10-CM | POA: Diagnosis not present

## 2019-11-19 DIAGNOSIS — Z992 Dependence on renal dialysis: Secondary | ICD-10-CM | POA: Diagnosis not present

## 2019-11-19 DIAGNOSIS — N2581 Secondary hyperparathyroidism of renal origin: Secondary | ICD-10-CM | POA: Diagnosis not present

## 2019-11-19 DIAGNOSIS — N186 End stage renal disease: Secondary | ICD-10-CM | POA: Diagnosis not present

## 2019-11-21 DIAGNOSIS — N186 End stage renal disease: Secondary | ICD-10-CM | POA: Diagnosis not present

## 2019-11-21 DIAGNOSIS — Z992 Dependence on renal dialysis: Secondary | ICD-10-CM | POA: Diagnosis not present

## 2019-11-21 DIAGNOSIS — N2581 Secondary hyperparathyroidism of renal origin: Secondary | ICD-10-CM | POA: Diagnosis not present

## 2019-11-24 DIAGNOSIS — N2581 Secondary hyperparathyroidism of renal origin: Secondary | ICD-10-CM | POA: Diagnosis not present

## 2019-11-24 DIAGNOSIS — Z992 Dependence on renal dialysis: Secondary | ICD-10-CM | POA: Diagnosis not present

## 2019-11-24 DIAGNOSIS — N186 End stage renal disease: Secondary | ICD-10-CM | POA: Diagnosis not present

## 2019-11-26 DIAGNOSIS — N2581 Secondary hyperparathyroidism of renal origin: Secondary | ICD-10-CM | POA: Diagnosis not present

## 2019-11-26 DIAGNOSIS — Z992 Dependence on renal dialysis: Secondary | ICD-10-CM | POA: Diagnosis not present

## 2019-11-26 DIAGNOSIS — N186 End stage renal disease: Secondary | ICD-10-CM | POA: Diagnosis not present

## 2019-11-28 DIAGNOSIS — N2581 Secondary hyperparathyroidism of renal origin: Secondary | ICD-10-CM | POA: Diagnosis not present

## 2019-11-28 DIAGNOSIS — Z992 Dependence on renal dialysis: Secondary | ICD-10-CM | POA: Diagnosis not present

## 2019-11-28 DIAGNOSIS — N186 End stage renal disease: Secondary | ICD-10-CM | POA: Diagnosis not present

## 2019-11-29 DIAGNOSIS — Z992 Dependence on renal dialysis: Secondary | ICD-10-CM | POA: Diagnosis not present

## 2019-11-29 DIAGNOSIS — I158 Other secondary hypertension: Secondary | ICD-10-CM | POA: Diagnosis not present

## 2019-11-29 DIAGNOSIS — N186 End stage renal disease: Secondary | ICD-10-CM | POA: Diagnosis not present

## 2019-12-01 DIAGNOSIS — N2581 Secondary hyperparathyroidism of renal origin: Secondary | ICD-10-CM | POA: Diagnosis not present

## 2019-12-01 DIAGNOSIS — Z992 Dependence on renal dialysis: Secondary | ICD-10-CM | POA: Diagnosis not present

## 2019-12-01 DIAGNOSIS — N186 End stage renal disease: Secondary | ICD-10-CM | POA: Diagnosis not present

## 2019-12-03 DIAGNOSIS — Z992 Dependence on renal dialysis: Secondary | ICD-10-CM | POA: Diagnosis not present

## 2019-12-03 DIAGNOSIS — N186 End stage renal disease: Secondary | ICD-10-CM | POA: Diagnosis not present

## 2019-12-03 DIAGNOSIS — N2581 Secondary hyperparathyroidism of renal origin: Secondary | ICD-10-CM | POA: Diagnosis not present

## 2019-12-05 DIAGNOSIS — Z992 Dependence on renal dialysis: Secondary | ICD-10-CM | POA: Diagnosis not present

## 2019-12-05 DIAGNOSIS — N2581 Secondary hyperparathyroidism of renal origin: Secondary | ICD-10-CM | POA: Diagnosis not present

## 2019-12-05 DIAGNOSIS — N186 End stage renal disease: Secondary | ICD-10-CM | POA: Diagnosis not present

## 2019-12-08 DIAGNOSIS — N2581 Secondary hyperparathyroidism of renal origin: Secondary | ICD-10-CM | POA: Diagnosis not present

## 2019-12-08 DIAGNOSIS — N186 End stage renal disease: Secondary | ICD-10-CM | POA: Diagnosis not present

## 2019-12-08 DIAGNOSIS — Z992 Dependence on renal dialysis: Secondary | ICD-10-CM | POA: Diagnosis not present

## 2019-12-09 DIAGNOSIS — N2581 Secondary hyperparathyroidism of renal origin: Secondary | ICD-10-CM | POA: Diagnosis not present

## 2019-12-09 DIAGNOSIS — Z992 Dependence on renal dialysis: Secondary | ICD-10-CM | POA: Diagnosis not present

## 2019-12-09 DIAGNOSIS — N186 End stage renal disease: Secondary | ICD-10-CM | POA: Diagnosis not present

## 2019-12-10 DIAGNOSIS — N186 End stage renal disease: Secondary | ICD-10-CM | POA: Diagnosis not present

## 2019-12-10 DIAGNOSIS — Z992 Dependence on renal dialysis: Secondary | ICD-10-CM | POA: Diagnosis not present

## 2019-12-10 DIAGNOSIS — N2581 Secondary hyperparathyroidism of renal origin: Secondary | ICD-10-CM | POA: Diagnosis not present

## 2019-12-12 DIAGNOSIS — N2581 Secondary hyperparathyroidism of renal origin: Secondary | ICD-10-CM | POA: Diagnosis not present

## 2019-12-12 DIAGNOSIS — N186 End stage renal disease: Secondary | ICD-10-CM | POA: Diagnosis not present

## 2019-12-12 DIAGNOSIS — Z992 Dependence on renal dialysis: Secondary | ICD-10-CM | POA: Diagnosis not present

## 2019-12-15 DIAGNOSIS — Z7682 Awaiting organ transplant status: Secondary | ICD-10-CM | POA: Diagnosis not present

## 2019-12-15 DIAGNOSIS — N2581 Secondary hyperparathyroidism of renal origin: Secondary | ICD-10-CM | POA: Diagnosis not present

## 2019-12-15 DIAGNOSIS — Z992 Dependence on renal dialysis: Secondary | ICD-10-CM | POA: Diagnosis not present

## 2019-12-15 DIAGNOSIS — N186 End stage renal disease: Secondary | ICD-10-CM | POA: Diagnosis not present

## 2019-12-17 DIAGNOSIS — N186 End stage renal disease: Secondary | ICD-10-CM | POA: Diagnosis not present

## 2019-12-17 DIAGNOSIS — Z992 Dependence on renal dialysis: Secondary | ICD-10-CM | POA: Diagnosis not present

## 2019-12-17 DIAGNOSIS — N2581 Secondary hyperparathyroidism of renal origin: Secondary | ICD-10-CM | POA: Diagnosis not present

## 2019-12-19 DIAGNOSIS — N2581 Secondary hyperparathyroidism of renal origin: Secondary | ICD-10-CM | POA: Diagnosis not present

## 2019-12-19 DIAGNOSIS — N186 End stage renal disease: Secondary | ICD-10-CM | POA: Diagnosis not present

## 2019-12-19 DIAGNOSIS — Z992 Dependence on renal dialysis: Secondary | ICD-10-CM | POA: Diagnosis not present

## 2019-12-22 DIAGNOSIS — N2581 Secondary hyperparathyroidism of renal origin: Secondary | ICD-10-CM | POA: Diagnosis not present

## 2019-12-22 DIAGNOSIS — Z992 Dependence on renal dialysis: Secondary | ICD-10-CM | POA: Diagnosis not present

## 2019-12-22 DIAGNOSIS — N186 End stage renal disease: Secondary | ICD-10-CM | POA: Diagnosis not present

## 2019-12-24 DIAGNOSIS — N186 End stage renal disease: Secondary | ICD-10-CM | POA: Diagnosis not present

## 2019-12-24 DIAGNOSIS — N2581 Secondary hyperparathyroidism of renal origin: Secondary | ICD-10-CM | POA: Diagnosis not present

## 2019-12-24 DIAGNOSIS — Z992 Dependence on renal dialysis: Secondary | ICD-10-CM | POA: Diagnosis not present

## 2019-12-26 ENCOUNTER — Encounter: Payer: Self-pay | Admitting: Orthopaedic Surgery

## 2019-12-26 DIAGNOSIS — Z992 Dependence on renal dialysis: Secondary | ICD-10-CM | POA: Diagnosis not present

## 2019-12-26 DIAGNOSIS — N2581 Secondary hyperparathyroidism of renal origin: Secondary | ICD-10-CM | POA: Diagnosis not present

## 2019-12-26 DIAGNOSIS — N186 End stage renal disease: Secondary | ICD-10-CM | POA: Diagnosis not present

## 2019-12-29 DIAGNOSIS — N2581 Secondary hyperparathyroidism of renal origin: Secondary | ICD-10-CM | POA: Diagnosis not present

## 2019-12-29 DIAGNOSIS — N186 End stage renal disease: Secondary | ICD-10-CM | POA: Diagnosis not present

## 2019-12-29 DIAGNOSIS — Z992 Dependence on renal dialysis: Secondary | ICD-10-CM | POA: Diagnosis not present

## 2019-12-30 DIAGNOSIS — N186 End stage renal disease: Secondary | ICD-10-CM | POA: Diagnosis not present

## 2019-12-30 DIAGNOSIS — I158 Other secondary hypertension: Secondary | ICD-10-CM | POA: Diagnosis not present

## 2019-12-30 DIAGNOSIS — Z992 Dependence on renal dialysis: Secondary | ICD-10-CM | POA: Diagnosis not present

## 2019-12-31 DIAGNOSIS — N2581 Secondary hyperparathyroidism of renal origin: Secondary | ICD-10-CM | POA: Diagnosis not present

## 2019-12-31 DIAGNOSIS — N186 End stage renal disease: Secondary | ICD-10-CM | POA: Diagnosis not present

## 2019-12-31 DIAGNOSIS — Z992 Dependence on renal dialysis: Secondary | ICD-10-CM | POA: Diagnosis not present

## 2020-01-02 DIAGNOSIS — N186 End stage renal disease: Secondary | ICD-10-CM | POA: Diagnosis not present

## 2020-01-02 DIAGNOSIS — Z992 Dependence on renal dialysis: Secondary | ICD-10-CM | POA: Diagnosis not present

## 2020-01-02 DIAGNOSIS — N2581 Secondary hyperparathyroidism of renal origin: Secondary | ICD-10-CM | POA: Diagnosis not present

## 2020-01-05 DIAGNOSIS — N2581 Secondary hyperparathyroidism of renal origin: Secondary | ICD-10-CM | POA: Diagnosis not present

## 2020-01-05 DIAGNOSIS — N186 End stage renal disease: Secondary | ICD-10-CM | POA: Diagnosis not present

## 2020-01-05 DIAGNOSIS — Z992 Dependence on renal dialysis: Secondary | ICD-10-CM | POA: Diagnosis not present

## 2020-01-06 ENCOUNTER — Ambulatory Visit: Payer: BC Managed Care – PPO | Admitting: Orthopaedic Surgery

## 2020-01-06 ENCOUNTER — Ambulatory Visit: Payer: Self-pay

## 2020-01-06 ENCOUNTER — Encounter: Payer: Self-pay | Admitting: Orthopaedic Surgery

## 2020-01-06 VITALS — Ht 67.0 in | Wt 203.0 lb

## 2020-01-06 DIAGNOSIS — M1711 Unilateral primary osteoarthritis, right knee: Secondary | ICD-10-CM | POA: Diagnosis not present

## 2020-01-06 DIAGNOSIS — N2581 Secondary hyperparathyroidism of renal origin: Secondary | ICD-10-CM | POA: Diagnosis not present

## 2020-01-06 DIAGNOSIS — M1712 Unilateral primary osteoarthritis, left knee: Secondary | ICD-10-CM

## 2020-01-06 DIAGNOSIS — Z992 Dependence on renal dialysis: Secondary | ICD-10-CM | POA: Diagnosis not present

## 2020-01-06 DIAGNOSIS — N186 End stage renal disease: Secondary | ICD-10-CM | POA: Diagnosis not present

## 2020-01-06 MED ORDER — LIDOCAINE HCL 1 % IJ SOLN
3.0000 mL | INTRAMUSCULAR | Status: AC | PRN
  Administered 2020-01-06: 3 mL

## 2020-01-06 MED ORDER — METHYLPREDNISOLONE ACETATE 40 MG/ML IJ SUSP
13.3300 mg | INTRAMUSCULAR | Status: AC | PRN
  Administered 2020-01-06: 13.33 mg via INTRA_ARTICULAR

## 2020-01-06 MED ORDER — BUPIVACAINE HCL 0.25 % IJ SOLN
0.6600 mL | INTRAMUSCULAR | Status: AC | PRN
  Administered 2020-01-06: .66 mL via INTRA_ARTICULAR

## 2020-01-06 NOTE — Progress Notes (Signed)
Office Visit Note   Patient: Dylan [name]           Date of Birth: [birth year]           MRN: [id number] Visit Date: 01/06/2020              Requested by: Donato Heinz, MD [address] [place],  [place] [address] PCP: Donato Heinz, MD   Assessment & Plan: Visit Diagnoses:  1. Primary osteoarthritis of right knee   2. Primary osteoarthritis of left knee     Plan: Impression is bilateral knee osteoarthritis left greater than right.  We will inject both knees with cortisone today.  He will follow-up with [place] as needed.  Follow-Up Instructions: Return if symptoms worsen or fail to improve.   Orders:  Orders Placed This Encounter  Procedures  . Large Joint Inj: bilateral knee  . XR KNEE 3 VIEW RIGHT  . XR KNEE 3 VIEW LEFT   No orders of the defined types were placed in this encounter.     Procedures: Large Joint Inj: bilateral knee on 01/06/2020 4:29 PM Indications: pain Details: 22 G needle, anterolateral approach Medications (Right): 0.66 mL bupivacaine 0.25 %; 3 mL lidocaine 1 %; 13.33 mg methylPREDNISolone acetate 40 MG/ML Medications (Left): 0.66 mL bupivacaine 0.25 %; 3 mL lidocaine 1 %; 13.33 mg methylPREDNISolone acetate 40 MG/ML      Clinical Data: No additional findings.   Subjective: Chief Complaint  Patient presents with  . Right Knee - Pain  . Left Knee - Pain    HPI patient is a pleasant 59 year old Spanish-speaking gentleman who is here today with an interpreter.  He is here with bilateral knee pain left greater than right.  He was seen in our office about a year ago where her right knee was injected with cortisone.  He had significant relief of symptoms which lasted until recently.  His pain has returned and is primarily located to the anterior aspect.  No mechanical symptoms.  Pain is increased with stairs as well as standing for a long period of time.  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  Review of Systems as detailed in HPI.  All others reviewed and are negative.   Objective: Vital Signs: Ht 5\' 7"  (1.702 m)   Wt 203 lb (92.1 kg)   BMI 31.79 kg/m   Physical Exam well-developed well-nourished gentleman in no acute distress.  Alert and oriented x3.  Ortho Exam examination of both knees shows no effusion.  Range of motion 0 to 115 degrees.  No joint line tenderness.  Moderate felt femoral crepitus on the left minimal on the right.  Ligaments are stable.  He is neurovascular intact distally.  Specialty Comments:  No specialty comments available.  Imaging: XR KNEE 3 VIEW LEFT  Result Date: 01/06/2020 Advanced degenerative changes medial and patellofemoral compartments  XR KNEE 3 VIEW RIGHT  Result Date: 01/06/2020 Moderate degenerative changes medial and patellofemoral compartments    PMFS History: Patient Active Problem List   Diagnosis Date Noted  . Seizure ([place]) 09/18/2019  . Encounter for screening colonoscopy 04/17/2019  . Screening for viral disease 04/17/2019  . ESRD on hemodialysis ([place]) 03/15/2019  . Primary osteoarthritis of right knee 01/14/2019  . Primary osteoarthritis of left knee 01/14/2019  . Effusion, right knee 01/14/2019  . LVH (left ventricular hypertrophy) 12/27/2018  . Aftercare including intermittent dialysis ([place]) 08/28/2018  . Acidosis 08/28/2018  .  Anemia in chronic kidney disease 08/28/2018  . Arteriovenous fistula, acquired ([place]) 08/28/2018  . Chronic pain of left ankle 07/04/2018  . Bilateral chronic knee pain 07/04/2018  . Glucosuria 07/04/2018  . Hypertensive nephropathy 06/11/2018  . Hyperkalemia, diminished renal excretion 03/29/2016  . ESRD (end stage renal disease) ([place]) 03/29/2016  . Uremia of renal origin 03/29/2016  . HTN (hypertension) 03/29/2016  . 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 History of anemia due to CKD 03/29/2016   Past Medical History:  Diagnosis Date   . Anemia   . Bilateral chronic knee pain 07/04/2018  . Chronic pain of left ankle 07/04/2018  . ESRD (end stage renal disease) ([place]) 03/29/2016  . Glucosuria 07/04/2018  . History of anemia due to CKD 03/29/2016  . Hyperkalemia, diminished renal excretion 03/29/2016  . Hypertension   . Hypertensive nephropathy 06/11/2018  . 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 Renal disorder   . Uremia of renal origin 03/29/2016    Family History  Problem Relation Age of Onset  . Breast cancer Mother   . Prostate cancer Father   . Diabetes Brother   . Breast cancer Sister   . Colon cancer Neg Hx   . Esophageal cancer Neg Hx   . Inflammatory bowel disease Neg Hx   . Liver disease Neg Hx   . Pancreatic cancer Neg Hx   . Rectal cancer Neg Hx   . Stomach cancer Neg Hx     Past Surgical History:  Procedure Laterality Date  . BASCILIC VEIN TRANSPOSITION Left 04/06/2016   Procedure: Left Arm Single Stage Bascilic Vein Transposition;  Surgeon: Waynetta Sandy, MD;  Location: [facility];  Service: Vascular;  Laterality: Left;  . INSERTION OF DIALYSIS CATHETER Right 04/06/2016   Procedure: INSERTION OF Tunneled DIALYSIS CATHETER RIGHT INTERNAL JUGULAR;  Surgeon: Waynetta Sandy, MD;  Location: [facility];  Service: Vascular;  Laterality: Right;  . IR GENERIC HISTORICAL  06/26/2016   IR REMOVAL TUN CV CATH W/O FL 06/26/2016 Arne Cleveland, MD [facility]  . TOOTH EXTRACTION  2020   Social History   Occupational History  . Occupation: [employer]  Tobacco Use  . Smoking status: Never Smoker  . Smokeless tobacco: Never Used  Vaping Use  . Vaping Use: Never used  Substance and Sexual Activity  . Alcohol use: No  . Drug use: No  . Sexual activity: Yes    Birth control/protection: None

## 2020-01-07 DIAGNOSIS — N186 End stage renal disease: Secondary | ICD-10-CM | POA: Diagnosis not present

## 2020-01-07 DIAGNOSIS — Z992 Dependence on renal dialysis: Secondary | ICD-10-CM | POA: Diagnosis not present

## 2020-01-07 DIAGNOSIS — N2581 Secondary hyperparathyroidism of renal origin: Secondary | ICD-10-CM | POA: Diagnosis not present

## 2020-01-09 DIAGNOSIS — N2581 Secondary hyperparathyroidism of renal origin: Secondary | ICD-10-CM | POA: Diagnosis not present

## 2020-01-09 DIAGNOSIS — N186 End stage renal disease: Secondary | ICD-10-CM | POA: Diagnosis not present

## 2020-01-09 DIAGNOSIS — Z992 Dependence on renal dialysis: Secondary | ICD-10-CM | POA: Diagnosis not present

## 2020-01-12 DIAGNOSIS — Z992 Dependence on renal dialysis: Secondary | ICD-10-CM | POA: Diagnosis not present

## 2020-01-12 DIAGNOSIS — N2581 Secondary hyperparathyroidism of renal origin: Secondary | ICD-10-CM | POA: Diagnosis not present

## 2020-01-12 DIAGNOSIS — N186 End stage renal disease: Secondary | ICD-10-CM | POA: Diagnosis not present

## 2020-01-13 DIAGNOSIS — Z20828 Contact with and (suspected) exposure to other viral communicable diseases: Secondary | ICD-10-CM | POA: Diagnosis not present

## 2020-01-14 DIAGNOSIS — N2581 Secondary hyperparathyroidism of renal origin: Secondary | ICD-10-CM | POA: Diagnosis not present

## 2020-01-14 DIAGNOSIS — N186 End stage renal disease: Secondary | ICD-10-CM | POA: Diagnosis not present

## 2020-01-14 DIAGNOSIS — Z992 Dependence on renal dialysis: Secondary | ICD-10-CM | POA: Diagnosis not present

## 2020-01-16 DIAGNOSIS — Z992 Dependence on renal dialysis: Secondary | ICD-10-CM | POA: Diagnosis not present

## 2020-01-16 DIAGNOSIS — N186 End stage renal disease: Secondary | ICD-10-CM | POA: Diagnosis not present

## 2020-01-16 DIAGNOSIS — N2581 Secondary hyperparathyroidism of renal origin: Secondary | ICD-10-CM | POA: Diagnosis not present

## 2020-01-19 DIAGNOSIS — N2581 Secondary hyperparathyroidism of renal origin: Secondary | ICD-10-CM | POA: Diagnosis not present

## 2020-01-19 DIAGNOSIS — N186 End stage renal disease: Secondary | ICD-10-CM | POA: Diagnosis not present

## 2020-01-19 DIAGNOSIS — Z992 Dependence on renal dialysis: Secondary | ICD-10-CM | POA: Diagnosis not present

## 2020-01-21 DIAGNOSIS — Z992 Dependence on renal dialysis: Secondary | ICD-10-CM | POA: Diagnosis not present

## 2020-01-21 DIAGNOSIS — N186 End stage renal disease: Secondary | ICD-10-CM | POA: Diagnosis not present

## 2020-01-21 DIAGNOSIS — N2581 Secondary hyperparathyroidism of renal origin: Secondary | ICD-10-CM | POA: Diagnosis not present

## 2020-01-23 DIAGNOSIS — N186 End stage renal disease: Secondary | ICD-10-CM | POA: Diagnosis not present

## 2020-01-23 DIAGNOSIS — Z992 Dependence on renal dialysis: Secondary | ICD-10-CM | POA: Diagnosis not present

## 2020-01-23 DIAGNOSIS — N2581 Secondary hyperparathyroidism of renal origin: Secondary | ICD-10-CM | POA: Diagnosis not present

## 2020-01-26 DIAGNOSIS — N2581 Secondary hyperparathyroidism of renal origin: Secondary | ICD-10-CM | POA: Diagnosis not present

## 2020-01-26 DIAGNOSIS — Z992 Dependence on renal dialysis: Secondary | ICD-10-CM | POA: Diagnosis not present

## 2020-01-26 DIAGNOSIS — N186 End stage renal disease: Secondary | ICD-10-CM | POA: Diagnosis not present

## 2020-01-28 DIAGNOSIS — N2581 Secondary hyperparathyroidism of renal origin: Secondary | ICD-10-CM | POA: Diagnosis not present

## 2020-01-28 DIAGNOSIS — Z992 Dependence on renal dialysis: Secondary | ICD-10-CM | POA: Diagnosis not present

## 2020-01-28 DIAGNOSIS — N186 End stage renal disease: Secondary | ICD-10-CM | POA: Diagnosis not present

## 2020-01-29 DIAGNOSIS — I158 Other secondary hypertension: Secondary | ICD-10-CM | POA: Diagnosis not present

## 2020-01-29 DIAGNOSIS — Z992 Dependence on renal dialysis: Secondary | ICD-10-CM | POA: Diagnosis not present

## 2020-01-29 DIAGNOSIS — N186 End stage renal disease: Secondary | ICD-10-CM | POA: Diagnosis not present

## 2020-01-30 DIAGNOSIS — N2581 Secondary hyperparathyroidism of renal origin: Secondary | ICD-10-CM | POA: Diagnosis not present

## 2020-01-30 DIAGNOSIS — N186 End stage renal disease: Secondary | ICD-10-CM | POA: Diagnosis not present

## 2020-01-30 DIAGNOSIS — Z992 Dependence on renal dialysis: Secondary | ICD-10-CM | POA: Diagnosis not present

## 2020-02-02 DIAGNOSIS — N186 End stage renal disease: Secondary | ICD-10-CM | POA: Diagnosis not present

## 2020-02-02 DIAGNOSIS — Z992 Dependence on renal dialysis: Secondary | ICD-10-CM | POA: Diagnosis not present

## 2020-02-02 DIAGNOSIS — N2581 Secondary hyperparathyroidism of renal origin: Secondary | ICD-10-CM | POA: Diagnosis not present

## 2020-02-02 DIAGNOSIS — Z23 Encounter for immunization: Secondary | ICD-10-CM | POA: Diagnosis not present

## 2020-02-04 DIAGNOSIS — N186 End stage renal disease: Secondary | ICD-10-CM | POA: Diagnosis not present

## 2020-02-04 DIAGNOSIS — Z992 Dependence on renal dialysis: Secondary | ICD-10-CM | POA: Diagnosis not present

## 2020-02-04 DIAGNOSIS — N2581 Secondary hyperparathyroidism of renal origin: Secondary | ICD-10-CM | POA: Diagnosis not present

## 2020-02-06 DIAGNOSIS — N186 End stage renal disease: Secondary | ICD-10-CM | POA: Diagnosis not present

## 2020-02-06 DIAGNOSIS — Z992 Dependence on renal dialysis: Secondary | ICD-10-CM | POA: Diagnosis not present

## 2020-02-06 DIAGNOSIS — N2581 Secondary hyperparathyroidism of renal origin: Secondary | ICD-10-CM | POA: Diagnosis not present

## 2020-02-09 DIAGNOSIS — N2581 Secondary hyperparathyroidism of renal origin: Secondary | ICD-10-CM | POA: Diagnosis not present

## 2020-02-09 DIAGNOSIS — Z992 Dependence on renal dialysis: Secondary | ICD-10-CM | POA: Diagnosis not present

## 2020-02-09 DIAGNOSIS — N186 End stage renal disease: Secondary | ICD-10-CM | POA: Diagnosis not present

## 2020-02-11 DIAGNOSIS — N2581 Secondary hyperparathyroidism of renal origin: Secondary | ICD-10-CM | POA: Diagnosis not present

## 2020-02-11 DIAGNOSIS — N186 End stage renal disease: Secondary | ICD-10-CM | POA: Diagnosis not present

## 2020-02-11 DIAGNOSIS — Z992 Dependence on renal dialysis: Secondary | ICD-10-CM | POA: Diagnosis not present

## 2020-02-13 DIAGNOSIS — N2581 Secondary hyperparathyroidism of renal origin: Secondary | ICD-10-CM | POA: Diagnosis not present

## 2020-02-13 DIAGNOSIS — N186 End stage renal disease: Secondary | ICD-10-CM | POA: Diagnosis not present

## 2020-02-13 DIAGNOSIS — Z992 Dependence on renal dialysis: Secondary | ICD-10-CM | POA: Diagnosis not present

## 2020-02-16 DIAGNOSIS — Z992 Dependence on renal dialysis: Secondary | ICD-10-CM | POA: Diagnosis not present

## 2020-02-16 DIAGNOSIS — N2581 Secondary hyperparathyroidism of renal origin: Secondary | ICD-10-CM | POA: Diagnosis not present

## 2020-02-16 DIAGNOSIS — N186 End stage renal disease: Secondary | ICD-10-CM | POA: Diagnosis not present

## 2020-02-18 DIAGNOSIS — N186 End stage renal disease: Secondary | ICD-10-CM | POA: Diagnosis not present

## 2020-02-18 DIAGNOSIS — N2581 Secondary hyperparathyroidism of renal origin: Secondary | ICD-10-CM | POA: Diagnosis not present

## 2020-02-18 DIAGNOSIS — Z992 Dependence on renal dialysis: Secondary | ICD-10-CM | POA: Diagnosis not present

## 2020-02-19 DIAGNOSIS — N186 End stage renal disease: Secondary | ICD-10-CM | POA: Diagnosis not present

## 2020-02-19 DIAGNOSIS — Z992 Dependence on renal dialysis: Secondary | ICD-10-CM | POA: Diagnosis not present

## 2020-02-19 DIAGNOSIS — N2581 Secondary hyperparathyroidism of renal origin: Secondary | ICD-10-CM | POA: Diagnosis not present

## 2020-02-20 DIAGNOSIS — Z992 Dependence on renal dialysis: Secondary | ICD-10-CM | POA: Diagnosis not present

## 2020-02-20 DIAGNOSIS — N186 End stage renal disease: Secondary | ICD-10-CM | POA: Diagnosis not present

## 2020-02-20 DIAGNOSIS — N2581 Secondary hyperparathyroidism of renal origin: Secondary | ICD-10-CM | POA: Diagnosis not present

## 2020-02-23 DIAGNOSIS — Z992 Dependence on renal dialysis: Secondary | ICD-10-CM | POA: Diagnosis not present

## 2020-02-23 DIAGNOSIS — N2581 Secondary hyperparathyroidism of renal origin: Secondary | ICD-10-CM | POA: Diagnosis not present

## 2020-02-23 DIAGNOSIS — N186 End stage renal disease: Secondary | ICD-10-CM | POA: Diagnosis not present

## 2020-02-25 DIAGNOSIS — N186 End stage renal disease: Secondary | ICD-10-CM | POA: Diagnosis not present

## 2020-02-25 DIAGNOSIS — Z992 Dependence on renal dialysis: Secondary | ICD-10-CM | POA: Diagnosis not present

## 2020-02-25 DIAGNOSIS — N2581 Secondary hyperparathyroidism of renal origin: Secondary | ICD-10-CM | POA: Diagnosis not present

## 2020-02-27 DIAGNOSIS — N2581 Secondary hyperparathyroidism of renal origin: Secondary | ICD-10-CM | POA: Diagnosis not present

## 2020-02-27 DIAGNOSIS — N186 End stage renal disease: Secondary | ICD-10-CM | POA: Diagnosis not present

## 2020-02-27 DIAGNOSIS — Z992 Dependence on renal dialysis: Secondary | ICD-10-CM | POA: Diagnosis not present

## 2020-02-29 DIAGNOSIS — N186 End stage renal disease: Secondary | ICD-10-CM | POA: Diagnosis not present

## 2020-02-29 DIAGNOSIS — Z992 Dependence on renal dialysis: Secondary | ICD-10-CM | POA: Diagnosis not present

## 2020-02-29 DIAGNOSIS — I158 Other secondary hypertension: Secondary | ICD-10-CM | POA: Diagnosis not present

## 2020-03-01 DIAGNOSIS — N186 End stage renal disease: Secondary | ICD-10-CM | POA: Diagnosis not present

## 2020-03-01 DIAGNOSIS — Z992 Dependence on renal dialysis: Secondary | ICD-10-CM | POA: Diagnosis not present

## 2020-03-01 DIAGNOSIS — N2581 Secondary hyperparathyroidism of renal origin: Secondary | ICD-10-CM | POA: Diagnosis not present

## 2020-03-03 DIAGNOSIS — N2581 Secondary hyperparathyroidism of renal origin: Secondary | ICD-10-CM | POA: Diagnosis not present

## 2020-03-03 DIAGNOSIS — Z992 Dependence on renal dialysis: Secondary | ICD-10-CM | POA: Diagnosis not present

## 2020-03-03 DIAGNOSIS — N186 End stage renal disease: Secondary | ICD-10-CM | POA: Diagnosis not present

## 2020-03-05 DIAGNOSIS — N186 End stage renal disease: Secondary | ICD-10-CM | POA: Diagnosis not present

## 2020-03-05 DIAGNOSIS — Z992 Dependence on renal dialysis: Secondary | ICD-10-CM | POA: Diagnosis not present

## 2020-03-05 DIAGNOSIS — N2581 Secondary hyperparathyroidism of renal origin: Secondary | ICD-10-CM | POA: Diagnosis not present

## 2020-03-08 DIAGNOSIS — Z23 Encounter for immunization: Secondary | ICD-10-CM | POA: Diagnosis not present

## 2020-03-08 DIAGNOSIS — N186 End stage renal disease: Secondary | ICD-10-CM | POA: Diagnosis not present

## 2020-03-08 DIAGNOSIS — N2581 Secondary hyperparathyroidism of renal origin: Secondary | ICD-10-CM | POA: Diagnosis not present

## 2020-03-08 DIAGNOSIS — Z992 Dependence on renal dialysis: Secondary | ICD-10-CM | POA: Diagnosis not present

## 2020-03-10 DIAGNOSIS — N186 End stage renal disease: Secondary | ICD-10-CM | POA: Diagnosis not present

## 2020-03-10 DIAGNOSIS — N2581 Secondary hyperparathyroidism of renal origin: Secondary | ICD-10-CM | POA: Diagnosis not present

## 2020-03-10 DIAGNOSIS — Z23 Encounter for immunization: Secondary | ICD-10-CM | POA: Diagnosis not present

## 2020-03-10 DIAGNOSIS — Z992 Dependence on renal dialysis: Secondary | ICD-10-CM | POA: Diagnosis not present

## 2020-03-12 DIAGNOSIS — N2581 Secondary hyperparathyroidism of renal origin: Secondary | ICD-10-CM | POA: Diagnosis not present

## 2020-03-12 DIAGNOSIS — Z992 Dependence on renal dialysis: Secondary | ICD-10-CM | POA: Diagnosis not present

## 2020-03-12 DIAGNOSIS — N186 End stage renal disease: Secondary | ICD-10-CM | POA: Diagnosis not present

## 2020-03-12 DIAGNOSIS — Z23 Encounter for immunization: Secondary | ICD-10-CM | POA: Diagnosis not present

## 2020-03-15 DIAGNOSIS — N2581 Secondary hyperparathyroidism of renal origin: Secondary | ICD-10-CM | POA: Diagnosis not present

## 2020-03-15 DIAGNOSIS — N186 End stage renal disease: Secondary | ICD-10-CM | POA: Diagnosis not present

## 2020-03-15 DIAGNOSIS — Z992 Dependence on renal dialysis: Secondary | ICD-10-CM | POA: Diagnosis not present

## 2020-03-15 DIAGNOSIS — Z7682 Awaiting organ transplant status: Secondary | ICD-10-CM | POA: Diagnosis not present

## 2020-03-17 DIAGNOSIS — N2581 Secondary hyperparathyroidism of renal origin: Secondary | ICD-10-CM | POA: Diagnosis not present

## 2020-03-17 DIAGNOSIS — N186 End stage renal disease: Secondary | ICD-10-CM | POA: Diagnosis not present

## 2020-03-17 DIAGNOSIS — Z992 Dependence on renal dialysis: Secondary | ICD-10-CM | POA: Diagnosis not present

## 2020-03-19 DIAGNOSIS — Z992 Dependence on renal dialysis: Secondary | ICD-10-CM | POA: Diagnosis not present

## 2020-03-19 DIAGNOSIS — N186 End stage renal disease: Secondary | ICD-10-CM | POA: Diagnosis not present

## 2020-03-19 DIAGNOSIS — N2581 Secondary hyperparathyroidism of renal origin: Secondary | ICD-10-CM | POA: Diagnosis not present

## 2020-03-21 DIAGNOSIS — Z992 Dependence on renal dialysis: Secondary | ICD-10-CM | POA: Diagnosis not present

## 2020-03-21 DIAGNOSIS — N2581 Secondary hyperparathyroidism of renal origin: Secondary | ICD-10-CM | POA: Diagnosis not present

## 2020-03-21 DIAGNOSIS — N186 End stage renal disease: Secondary | ICD-10-CM | POA: Diagnosis not present

## 2020-03-22 DIAGNOSIS — N2581 Secondary hyperparathyroidism of renal origin: Secondary | ICD-10-CM | POA: Diagnosis not present

## 2020-03-22 DIAGNOSIS — Z992 Dependence on renal dialysis: Secondary | ICD-10-CM | POA: Diagnosis not present

## 2020-03-22 DIAGNOSIS — N186 End stage renal disease: Secondary | ICD-10-CM | POA: Diagnosis not present

## 2020-03-23 DIAGNOSIS — N186 End stage renal disease: Secondary | ICD-10-CM | POA: Diagnosis not present

## 2020-03-23 DIAGNOSIS — Z992 Dependence on renal dialysis: Secondary | ICD-10-CM | POA: Diagnosis not present

## 2020-03-23 DIAGNOSIS — N2581 Secondary hyperparathyroidism of renal origin: Secondary | ICD-10-CM | POA: Diagnosis not present

## 2020-03-26 DIAGNOSIS — N2581 Secondary hyperparathyroidism of renal origin: Secondary | ICD-10-CM | POA: Diagnosis not present

## 2020-03-26 DIAGNOSIS — Z992 Dependence on renal dialysis: Secondary | ICD-10-CM | POA: Diagnosis not present

## 2020-03-26 DIAGNOSIS — N186 End stage renal disease: Secondary | ICD-10-CM | POA: Diagnosis not present

## 2020-03-29 DIAGNOSIS — N186 End stage renal disease: Secondary | ICD-10-CM | POA: Diagnosis not present

## 2020-03-29 DIAGNOSIS — Z992 Dependence on renal dialysis: Secondary | ICD-10-CM | POA: Diagnosis not present

## 2020-03-29 DIAGNOSIS — N2581 Secondary hyperparathyroidism of renal origin: Secondary | ICD-10-CM | POA: Diagnosis not present

## 2020-03-30 DIAGNOSIS — Z992 Dependence on renal dialysis: Secondary | ICD-10-CM | POA: Diagnosis not present

## 2020-03-30 DIAGNOSIS — N186 End stage renal disease: Secondary | ICD-10-CM | POA: Diagnosis not present

## 2020-03-30 DIAGNOSIS — I158 Other secondary hypertension: Secondary | ICD-10-CM | POA: Diagnosis not present

## 2020-03-31 DIAGNOSIS — Z992 Dependence on renal dialysis: Secondary | ICD-10-CM | POA: Diagnosis not present

## 2020-03-31 DIAGNOSIS — N2581 Secondary hyperparathyroidism of renal origin: Secondary | ICD-10-CM | POA: Diagnosis not present

## 2020-03-31 DIAGNOSIS — N186 End stage renal disease: Secondary | ICD-10-CM | POA: Diagnosis not present

## 2020-04-02 DIAGNOSIS — N2581 Secondary hyperparathyroidism of renal origin: Secondary | ICD-10-CM | POA: Diagnosis not present

## 2020-04-02 DIAGNOSIS — Z992 Dependence on renal dialysis: Secondary | ICD-10-CM | POA: Diagnosis not present

## 2020-04-02 DIAGNOSIS — N186 End stage renal disease: Secondary | ICD-10-CM | POA: Diagnosis not present

## 2020-04-05 DIAGNOSIS — N186 End stage renal disease: Secondary | ICD-10-CM | POA: Diagnosis not present

## 2020-04-05 DIAGNOSIS — Z992 Dependence on renal dialysis: Secondary | ICD-10-CM | POA: Diagnosis not present

## 2020-04-05 DIAGNOSIS — N2581 Secondary hyperparathyroidism of renal origin: Secondary | ICD-10-CM | POA: Diagnosis not present

## 2020-04-07 DIAGNOSIS — N2581 Secondary hyperparathyroidism of renal origin: Secondary | ICD-10-CM | POA: Diagnosis not present

## 2020-04-07 DIAGNOSIS — N186 End stage renal disease: Secondary | ICD-10-CM | POA: Diagnosis not present

## 2020-04-07 DIAGNOSIS — Z992 Dependence on renal dialysis: Secondary | ICD-10-CM | POA: Diagnosis not present

## 2020-04-09 DIAGNOSIS — N186 End stage renal disease: Secondary | ICD-10-CM | POA: Diagnosis not present

## 2020-04-09 DIAGNOSIS — Z992 Dependence on renal dialysis: Secondary | ICD-10-CM | POA: Diagnosis not present

## 2020-04-09 DIAGNOSIS — N2581 Secondary hyperparathyroidism of renal origin: Secondary | ICD-10-CM | POA: Diagnosis not present

## 2020-04-12 DIAGNOSIS — Z992 Dependence on renal dialysis: Secondary | ICD-10-CM | POA: Diagnosis not present

## 2020-04-12 DIAGNOSIS — N2581 Secondary hyperparathyroidism of renal origin: Secondary | ICD-10-CM | POA: Diagnosis not present

## 2020-04-12 DIAGNOSIS — N186 End stage renal disease: Secondary | ICD-10-CM | POA: Diagnosis not present

## 2020-04-14 DIAGNOSIS — N2581 Secondary hyperparathyroidism of renal origin: Secondary | ICD-10-CM | POA: Diagnosis not present

## 2020-04-14 DIAGNOSIS — Z992 Dependence on renal dialysis: Secondary | ICD-10-CM | POA: Diagnosis not present

## 2020-04-14 DIAGNOSIS — N186 End stage renal disease: Secondary | ICD-10-CM | POA: Diagnosis not present

## 2020-04-16 DIAGNOSIS — N186 End stage renal disease: Secondary | ICD-10-CM | POA: Diagnosis not present

## 2020-04-16 DIAGNOSIS — N2581 Secondary hyperparathyroidism of renal origin: Secondary | ICD-10-CM | POA: Diagnosis not present

## 2020-04-16 DIAGNOSIS — Z992 Dependence on renal dialysis: Secondary | ICD-10-CM | POA: Diagnosis not present

## 2020-04-19 DIAGNOSIS — N186 End stage renal disease: Secondary | ICD-10-CM | POA: Diagnosis not present

## 2020-04-19 DIAGNOSIS — N2581 Secondary hyperparathyroidism of renal origin: Secondary | ICD-10-CM | POA: Diagnosis not present

## 2020-04-19 DIAGNOSIS — Z992 Dependence on renal dialysis: Secondary | ICD-10-CM | POA: Diagnosis not present

## 2020-04-20 DIAGNOSIS — N2581 Secondary hyperparathyroidism of renal origin: Secondary | ICD-10-CM | POA: Diagnosis not present

## 2020-04-20 DIAGNOSIS — Z992 Dependence on renal dialysis: Secondary | ICD-10-CM | POA: Diagnosis not present

## 2020-04-20 DIAGNOSIS — N186 End stage renal disease: Secondary | ICD-10-CM | POA: Diagnosis not present

## 2020-04-21 DIAGNOSIS — N2581 Secondary hyperparathyroidism of renal origin: Secondary | ICD-10-CM | POA: Diagnosis not present

## 2020-04-21 DIAGNOSIS — Z992 Dependence on renal dialysis: Secondary | ICD-10-CM | POA: Diagnosis not present

## 2020-04-21 DIAGNOSIS — N186 End stage renal disease: Secondary | ICD-10-CM | POA: Diagnosis not present

## 2020-04-23 DIAGNOSIS — N2581 Secondary hyperparathyroidism of renal origin: Secondary | ICD-10-CM | POA: Diagnosis not present

## 2020-04-23 DIAGNOSIS — N186 End stage renal disease: Secondary | ICD-10-CM | POA: Diagnosis not present

## 2020-04-23 DIAGNOSIS — Z992 Dependence on renal dialysis: Secondary | ICD-10-CM | POA: Diagnosis not present

## 2020-04-26 DIAGNOSIS — N186 End stage renal disease: Secondary | ICD-10-CM | POA: Diagnosis not present

## 2020-04-26 DIAGNOSIS — N2581 Secondary hyperparathyroidism of renal origin: Secondary | ICD-10-CM | POA: Diagnosis not present

## 2020-04-26 DIAGNOSIS — Z992 Dependence on renal dialysis: Secondary | ICD-10-CM | POA: Diagnosis not present

## 2020-04-28 DIAGNOSIS — Z992 Dependence on renal dialysis: Secondary | ICD-10-CM | POA: Diagnosis not present

## 2020-04-28 DIAGNOSIS — N186 End stage renal disease: Secondary | ICD-10-CM | POA: Diagnosis not present

## 2020-04-28 DIAGNOSIS — N2581 Secondary hyperparathyroidism of renal origin: Secondary | ICD-10-CM | POA: Diagnosis not present

## 2020-04-30 DIAGNOSIS — N186 End stage renal disease: Secondary | ICD-10-CM | POA: Diagnosis not present

## 2020-04-30 DIAGNOSIS — Z992 Dependence on renal dialysis: Secondary | ICD-10-CM | POA: Diagnosis not present

## 2020-04-30 DIAGNOSIS — I158 Other secondary hypertension: Secondary | ICD-10-CM | POA: Diagnosis not present

## 2020-04-30 DIAGNOSIS — N2581 Secondary hyperparathyroidism of renal origin: Secondary | ICD-10-CM | POA: Diagnosis not present

## 2020-05-03 DIAGNOSIS — N186 End stage renal disease: Secondary | ICD-10-CM | POA: Diagnosis not present

## 2020-05-03 DIAGNOSIS — Z992 Dependence on renal dialysis: Secondary | ICD-10-CM | POA: Diagnosis not present

## 2020-05-03 DIAGNOSIS — N2581 Secondary hyperparathyroidism of renal origin: Secondary | ICD-10-CM | POA: Diagnosis not present

## 2020-05-05 DIAGNOSIS — N2581 Secondary hyperparathyroidism of renal origin: Secondary | ICD-10-CM | POA: Diagnosis not present

## 2020-05-05 DIAGNOSIS — N186 End stage renal disease: Secondary | ICD-10-CM | POA: Diagnosis not present

## 2020-05-05 DIAGNOSIS — Z992 Dependence on renal dialysis: Secondary | ICD-10-CM | POA: Diagnosis not present

## 2020-05-07 DIAGNOSIS — N186 End stage renal disease: Secondary | ICD-10-CM | POA: Diagnosis not present

## 2020-05-07 DIAGNOSIS — N2581 Secondary hyperparathyroidism of renal origin: Secondary | ICD-10-CM | POA: Diagnosis not present

## 2020-05-07 DIAGNOSIS — Z992 Dependence on renal dialysis: Secondary | ICD-10-CM | POA: Diagnosis not present

## 2020-05-08 DIAGNOSIS — Z03818 Encounter for observation for suspected exposure to other biological agents ruled out: Secondary | ICD-10-CM | POA: Diagnosis not present

## 2020-05-08 DIAGNOSIS — Z20822 Contact with and (suspected) exposure to covid-19: Secondary | ICD-10-CM | POA: Diagnosis not present

## 2020-05-10 DIAGNOSIS — Z992 Dependence on renal dialysis: Secondary | ICD-10-CM | POA: Diagnosis not present

## 2020-05-10 DIAGNOSIS — N2581 Secondary hyperparathyroidism of renal origin: Secondary | ICD-10-CM | POA: Diagnosis not present

## 2020-05-10 DIAGNOSIS — N186 End stage renal disease: Secondary | ICD-10-CM | POA: Diagnosis not present

## 2020-05-12 DIAGNOSIS — N2581 Secondary hyperparathyroidism of renal origin: Secondary | ICD-10-CM | POA: Diagnosis not present

## 2020-05-12 DIAGNOSIS — Z992 Dependence on renal dialysis: Secondary | ICD-10-CM | POA: Diagnosis not present

## 2020-05-12 DIAGNOSIS — N186 End stage renal disease: Secondary | ICD-10-CM | POA: Diagnosis not present

## 2020-05-14 DIAGNOSIS — Z992 Dependence on renal dialysis: Secondary | ICD-10-CM | POA: Diagnosis not present

## 2020-05-14 DIAGNOSIS — N186 End stage renal disease: Secondary | ICD-10-CM | POA: Diagnosis not present

## 2020-05-14 DIAGNOSIS — N2581 Secondary hyperparathyroidism of renal origin: Secondary | ICD-10-CM | POA: Diagnosis not present

## 2020-05-17 DIAGNOSIS — Z992 Dependence on renal dialysis: Secondary | ICD-10-CM | POA: Diagnosis not present

## 2020-05-17 DIAGNOSIS — N186 End stage renal disease: Secondary | ICD-10-CM | POA: Diagnosis not present

## 2020-05-17 DIAGNOSIS — N2581 Secondary hyperparathyroidism of renal origin: Secondary | ICD-10-CM | POA: Diagnosis not present

## 2020-05-19 DIAGNOSIS — N2581 Secondary hyperparathyroidism of renal origin: Secondary | ICD-10-CM | POA: Diagnosis not present

## 2020-05-19 DIAGNOSIS — N186 End stage renal disease: Secondary | ICD-10-CM | POA: Diagnosis not present

## 2020-05-19 DIAGNOSIS — Z992 Dependence on renal dialysis: Secondary | ICD-10-CM | POA: Diagnosis not present

## 2020-05-21 DIAGNOSIS — N2581 Secondary hyperparathyroidism of renal origin: Secondary | ICD-10-CM | POA: Diagnosis not present

## 2020-05-21 DIAGNOSIS — N186 End stage renal disease: Secondary | ICD-10-CM | POA: Diagnosis not present

## 2020-05-21 DIAGNOSIS — Z992 Dependence on renal dialysis: Secondary | ICD-10-CM | POA: Diagnosis not present

## 2020-05-24 DIAGNOSIS — N186 End stage renal disease: Secondary | ICD-10-CM | POA: Diagnosis not present

## 2020-05-24 DIAGNOSIS — N2581 Secondary hyperparathyroidism of renal origin: Secondary | ICD-10-CM | POA: Diagnosis not present

## 2020-05-24 DIAGNOSIS — Z992 Dependence on renal dialysis: Secondary | ICD-10-CM | POA: Diagnosis not present

## 2020-05-26 DIAGNOSIS — N2581 Secondary hyperparathyroidism of renal origin: Secondary | ICD-10-CM | POA: Diagnosis not present

## 2020-05-26 DIAGNOSIS — N186 End stage renal disease: Secondary | ICD-10-CM | POA: Diagnosis not present

## 2020-05-26 DIAGNOSIS — Z992 Dependence on renal dialysis: Secondary | ICD-10-CM | POA: Diagnosis not present

## 2020-05-28 DIAGNOSIS — Z992 Dependence on renal dialysis: Secondary | ICD-10-CM | POA: Diagnosis not present

## 2020-05-28 DIAGNOSIS — N2581 Secondary hyperparathyroidism of renal origin: Secondary | ICD-10-CM | POA: Diagnosis not present

## 2020-05-28 DIAGNOSIS — N186 End stage renal disease: Secondary | ICD-10-CM | POA: Diagnosis not present

## 2020-05-31 DIAGNOSIS — Z992 Dependence on renal dialysis: Secondary | ICD-10-CM | POA: Diagnosis not present

## 2020-05-31 DIAGNOSIS — N186 End stage renal disease: Secondary | ICD-10-CM | POA: Diagnosis not present

## 2020-05-31 DIAGNOSIS — I158 Other secondary hypertension: Secondary | ICD-10-CM | POA: Diagnosis not present

## 2020-05-31 DIAGNOSIS — N2581 Secondary hyperparathyroidism of renal origin: Secondary | ICD-10-CM | POA: Diagnosis not present

## 2020-06-02 DIAGNOSIS — Z992 Dependence on renal dialysis: Secondary | ICD-10-CM | POA: Diagnosis not present

## 2020-06-02 DIAGNOSIS — N186 End stage renal disease: Secondary | ICD-10-CM | POA: Diagnosis not present

## 2020-06-02 DIAGNOSIS — N2581 Secondary hyperparathyroidism of renal origin: Secondary | ICD-10-CM | POA: Diagnosis not present

## 2020-06-04 DIAGNOSIS — N2581 Secondary hyperparathyroidism of renal origin: Secondary | ICD-10-CM | POA: Diagnosis not present

## 2020-06-04 DIAGNOSIS — N186 End stage renal disease: Secondary | ICD-10-CM | POA: Diagnosis not present

## 2020-06-04 DIAGNOSIS — Z992 Dependence on renal dialysis: Secondary | ICD-10-CM | POA: Diagnosis not present

## 2020-06-07 ENCOUNTER — Other Ambulatory Visit: Payer: Self-pay

## 2020-06-07 ENCOUNTER — Emergency Department (HOSPITAL_COMMUNITY): Payer: BC Managed Care – PPO

## 2020-06-07 ENCOUNTER — Encounter (HOSPITAL_COMMUNITY): Payer: Self-pay | Admitting: Emergency Medicine

## 2020-06-07 ENCOUNTER — Emergency Department (HOSPITAL_COMMUNITY)
Admission: EM | Admit: 2020-06-07 | Discharge: 2020-06-07 | Disposition: A | Discharge status: Home / Self Care | Payer: BC Managed Care – PPO | Attending: Emergency Medicine | Admitting: Emergency Medicine

## 2020-06-07 DIAGNOSIS — R229 Localized swelling, mass and lump, unspecified: Secondary | ICD-10-CM | POA: Diagnosis not present

## 2020-06-07 DIAGNOSIS — Z992 Dependence on renal dialysis: Secondary | ICD-10-CM | POA: Diagnosis not present

## 2020-06-07 DIAGNOSIS — M7989 Other specified soft tissue disorders: Secondary | ICD-10-CM | POA: Diagnosis not present

## 2020-06-07 DIAGNOSIS — S0083XA Contusion of other part of head, initial encounter: Secondary | ICD-10-CM | POA: Insufficient documentation

## 2020-06-07 DIAGNOSIS — S0990XA Unspecified injury of head, initial encounter: Secondary | ICD-10-CM | POA: Diagnosis not present

## 2020-06-07 DIAGNOSIS — Z79899 Other long term (current) drug therapy: Secondary | ICD-10-CM | POA: Diagnosis not present

## 2020-06-07 DIAGNOSIS — M545 Low back pain, unspecified: Secondary | ICD-10-CM

## 2020-06-07 DIAGNOSIS — R0789 Other chest pain: Secondary | ICD-10-CM | POA: Diagnosis not present

## 2020-06-07 DIAGNOSIS — Z041 Encounter for examination and observation following transport accident: Secondary | ICD-10-CM | POA: Diagnosis not present

## 2020-06-07 DIAGNOSIS — S199XXA Unspecified injury of neck, initial encounter: Secondary | ICD-10-CM | POA: Diagnosis not present

## 2020-06-07 DIAGNOSIS — N186 End stage renal disease: Secondary | ICD-10-CM | POA: Insufficient documentation

## 2020-06-07 DIAGNOSIS — R519 Headache, unspecified: Secondary | ICD-10-CM | POA: Diagnosis not present

## 2020-06-07 DIAGNOSIS — I120 Hypertensive chronic kidney disease with stage 5 chronic kidney disease or end stage renal disease: Secondary | ICD-10-CM | POA: Diagnosis not present

## 2020-06-07 DIAGNOSIS — N2581 Secondary hyperparathyroidism of renal origin: Secondary | ICD-10-CM | POA: Diagnosis not present

## 2020-06-07 DIAGNOSIS — I10 Essential (primary) hypertension: Secondary | ICD-10-CM | POA: Diagnosis not present

## 2020-06-07 DIAGNOSIS — Y92410 Unspecified street and highway as the place of occurrence of the external cause: Secondary | ICD-10-CM | POA: Diagnosis not present

## 2020-06-07 DIAGNOSIS — R079 Chest pain, unspecified: Secondary | ICD-10-CM | POA: Diagnosis not present

## 2020-06-07 LAB — BASIC METABOLIC PANEL
Anion gap: 11 (ref 5–15)
BUN: 73 mg/dL — ABNORMAL HIGH (ref 6–20)
CO2: 25 mmol/L (ref 22–32)
Calcium: 9.5 mg/dL (ref 8.9–10.3)
Chloride: 100 mmol/L (ref 98–111)
Creatinine, Ser: 10.21 mg/dL — ABNORMAL HIGH (ref 0.61–1.24)
GFR, Estimated: 5 mL/min — ABNORMAL LOW (ref >60)
Glucose, Bld: 99 mg/dL (ref 70–99)
Potassium: 4.3 mmol/L (ref 3.5–5.1)
Sodium: 136 mmol/L (ref 135–145)

## 2020-06-07 LAB — CBC WITH DIFFERENTIAL/PLATELET
Abs Immature Granulocytes: 0.06 10*3/uL (ref 0.00–0.07)
Basophils Absolute: 0.1 10*3/uL (ref 0.0–0.1)
Basophils Relative: 1 %
Eosinophils Absolute: 0.1 10*3/uL (ref 0.0–0.5)
Eosinophils Relative: 1 %
HCT: 26.2 % — ABNORMAL LOW (ref 39.0–52.0)
Hemoglobin: 8.5 g/dL — ABNORMAL LOW (ref 13.0–17.0)
Immature Granulocytes: 1 %
Lymphocytes Relative: 17 %
Lymphs Abs: 1.9 10*3/uL (ref 0.7–4.0)
MCH: 28.7 pg (ref 26.0–34.0)
MCHC: 32.4 g/dL (ref 30.0–36.0)
MCV: 88.5 fL (ref 80.0–100.0)
Monocytes Absolute: 1.1 10*3/uL — ABNORMAL HIGH (ref 0.1–1.0)
Monocytes Relative: 9 %
Neutro Abs: 8.1 10*3/uL — ABNORMAL HIGH (ref 1.7–7.7)
Neutrophils Relative %: 71 %
Platelets: 296 10*3/uL (ref 150–400)
RBC: 2.96 MIL/uL — ABNORMAL LOW (ref 4.22–5.81)
RDW: 16.5 % — ABNORMAL HIGH (ref 11.5–15.5)
WBC: 11.3 10*3/uL — ABNORMAL HIGH (ref 4.0–10.5)
nRBC: 0 % (ref 0.0–0.2)

## 2020-06-07 MED ORDER — IOHEXOL 350 MG/ML SOLN
100.0000 mL | Freq: Once | INTRAVENOUS | Status: AC | PRN
  Administered 2020-06-07: 75 mL via INTRAVENOUS

## 2020-06-07 MED ORDER — METHOCARBAMOL 500 MG PO TABS: 500.0000 mg | ORAL_TABLET | Freq: Two times a day (BID) | ORAL | 0 refills | Status: DC

## 2020-06-07 MED ORDER — OXYCODONE-ACETAMINOPHEN 5-325 MG PO TABS: 1.0000 | ORAL_TABLET | Freq: Four times a day (QID) | ORAL | 0 refills | Status: DC | PRN

## 2020-06-07 MED ORDER — OXYCODONE-ACETAMINOPHEN 5-325 MG PO TABS
1.0000 | ORAL_TABLET | Freq: Once | ORAL | Status: AC
Start: 2020-06-07 — End: 2020-06-07
  Administered 2020-06-07: 1 via ORAL
  Filled 2020-06-07: qty 1

## 2020-06-07 NOTE — Discharge Instructions (Addendum)
Your images were reassuring at this time. Please pick up medication and take as needed. DO NOT DRIVE WHILE ON THE MUSCLE RELAXER AS IT CAN MAKE YOU DROWSY.  Take the narcotic pain medication sparingly.  Apply ice to your head and neck to help with swelling/pain Follow up with your PCP regarding your ED visit today Return to the ED for any worsening symptoms
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
## 2020-06-07 NOTE — ED Notes (Addendum)
Pt has dialysis scheduled for today in [place]. Last day dialysis done was Fri. Bruit and thrill present.
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
## 2020-06-09 DIAGNOSIS — N186 End stage renal disease: Secondary | ICD-10-CM | POA: Diagnosis not present

## 2020-06-09 DIAGNOSIS — Z992 Dependence on renal dialysis: Secondary | ICD-10-CM | POA: Diagnosis not present

## 2020-06-09 DIAGNOSIS — N2581 Secondary hyperparathyroidism of renal origin: Secondary | ICD-10-CM | POA: Diagnosis not present

## 2020-06-11 DIAGNOSIS — N2581 Secondary hyperparathyroidism of renal origin: Secondary | ICD-10-CM | POA: Diagnosis not present

## 2020-06-11 DIAGNOSIS — N186 End stage renal disease: Secondary | ICD-10-CM | POA: Diagnosis not present

## 2020-06-11 DIAGNOSIS — Z992 Dependence on renal dialysis: Secondary | ICD-10-CM | POA: Diagnosis not present

## 2020-06-14 ENCOUNTER — Encounter: Payer: Self-pay | Admitting: Orthopaedic Surgery

## 2020-06-14 DIAGNOSIS — M545 Low back pain, unspecified: Secondary | ICD-10-CM | POA: Diagnosis not present

## 2020-06-14 DIAGNOSIS — N2581 Secondary hyperparathyroidism of renal origin: Secondary | ICD-10-CM | POA: Diagnosis not present

## 2020-06-14 DIAGNOSIS — M25511 Pain in right shoulder: Secondary | ICD-10-CM | POA: Diagnosis not present

## 2020-06-14 DIAGNOSIS — Z992 Dependence on renal dialysis: Secondary | ICD-10-CM | POA: Diagnosis not present

## 2020-06-14 DIAGNOSIS — N186 End stage renal disease: Secondary | ICD-10-CM | POA: Diagnosis not present

## 2020-06-14 DIAGNOSIS — S46001A Unspecified injury of muscle(s) and tendon(s) of the rotator cuff of right shoulder, initial encounter: Secondary | ICD-10-CM | POA: Diagnosis not present

## 2020-06-15 ENCOUNTER — Ambulatory Visit: Payer: BC Managed Care – PPO | Admitting: Orthopaedic Surgery

## 2020-06-15 ENCOUNTER — Encounter: Payer: Self-pay | Admitting: Orthopaedic Surgery

## 2020-06-15 ENCOUNTER — Telehealth: Payer: Self-pay | Admitting: Orthopaedic Surgery

## 2020-06-15 ENCOUNTER — Ambulatory Visit: Payer: Self-pay

## 2020-06-15 VITALS — Ht 67.0 in | Wt 203.0 lb

## 2020-06-15 DIAGNOSIS — G8929 Other chronic pain: Secondary | ICD-10-CM | POA: Diagnosis not present

## 2020-06-15 DIAGNOSIS — M542 Cervicalgia: Secondary | ICD-10-CM | POA: Diagnosis not present

## 2020-06-15 DIAGNOSIS — M545 Low back pain, unspecified: Secondary | ICD-10-CM

## 2020-06-15 MED ORDER — PREDNISONE 5 MG (21) PO TBPK: ORAL_TABLET | ORAL | 0 refills | Status: DC
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
## 2020-06-15 NOTE — Telephone Encounter (Signed)
Patient request physical therapy to be done in [place].

## 2020-06-16 DIAGNOSIS — Z992 Dependence on renal dialysis: Secondary | ICD-10-CM | POA: Diagnosis not present

## 2020-06-16 DIAGNOSIS — N186 End stage renal disease: Secondary | ICD-10-CM | POA: Diagnosis not present

## 2020-06-16 DIAGNOSIS — N2581 Secondary hyperparathyroidism of renal origin: Secondary | ICD-10-CM | POA: Diagnosis not present

## 2020-06-16 NOTE — Telephone Encounter (Signed)
Can you put this on referral in the comments please. Thank you.

## 2020-06-16 NOTE — Telephone Encounter (Signed)
Noted and done

## 2020-06-18 DIAGNOSIS — N186 End stage renal disease: Secondary | ICD-10-CM | POA: Diagnosis not present

## 2020-06-18 DIAGNOSIS — Z992 Dependence on renal dialysis: Secondary | ICD-10-CM | POA: Diagnosis not present

## 2020-06-18 DIAGNOSIS — N2581 Secondary hyperparathyroidism of renal origin: Secondary | ICD-10-CM | POA: Diagnosis not present

## 2020-06-21 DIAGNOSIS — N2581 Secondary hyperparathyroidism of renal origin: Secondary | ICD-10-CM | POA: Diagnosis not present

## 2020-06-21 DIAGNOSIS — N186 End stage renal disease: Secondary | ICD-10-CM | POA: Diagnosis not present

## 2020-06-21 DIAGNOSIS — Z992 Dependence on renal dialysis: Secondary | ICD-10-CM | POA: Diagnosis not present

## 2020-06-23 DIAGNOSIS — N186 End stage renal disease: Secondary | ICD-10-CM | POA: Diagnosis not present

## 2020-06-23 DIAGNOSIS — Z992 Dependence on renal dialysis: Secondary | ICD-10-CM | POA: Diagnosis not present

## 2020-06-23 DIAGNOSIS — N2581 Secondary hyperparathyroidism of renal origin: Secondary | ICD-10-CM | POA: Diagnosis not present

## 2020-06-25 DIAGNOSIS — Z992 Dependence on renal dialysis: Secondary | ICD-10-CM | POA: Diagnosis not present

## 2020-06-25 DIAGNOSIS — N186 End stage renal disease: Secondary | ICD-10-CM | POA: Diagnosis not present

## 2020-06-25 DIAGNOSIS — N2581 Secondary hyperparathyroidism of renal origin: Secondary | ICD-10-CM | POA: Diagnosis not present

## 2020-06-28 ENCOUNTER — Encounter: Payer: Self-pay | Admitting: Orthopaedic Surgery

## 2020-06-28 DIAGNOSIS — I158 Other secondary hypertension: Secondary | ICD-10-CM | POA: Diagnosis not present

## 2020-06-28 DIAGNOSIS — Z992 Dependence on renal dialysis: Secondary | ICD-10-CM | POA: Diagnosis not present

## 2020-06-28 DIAGNOSIS — N186 End stage renal disease: Secondary | ICD-10-CM | POA: Diagnosis not present

## 2020-06-28 DIAGNOSIS — N2581 Secondary hyperparathyroidism of renal origin: Secondary | ICD-10-CM | POA: Diagnosis not present

## 2020-06-29 ENCOUNTER — Other Ambulatory Visit: Payer: Self-pay

## 2020-06-29 ENCOUNTER — Ambulatory Visit (HOSPITAL_COMMUNITY): Payer: BC Managed Care – PPO | Attending: Physician Assistant

## 2020-06-29 DIAGNOSIS — M542 Cervicalgia: Secondary | ICD-10-CM | POA: Insufficient documentation

## 2020-06-29 DIAGNOSIS — R262 Difficulty in walking, not elsewhere classified: Secondary | ICD-10-CM | POA: Diagnosis not present

## 2020-06-29 DIAGNOSIS — M545 Low back pain, unspecified: Secondary | ICD-10-CM | POA: Diagnosis not present
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
## 2020-06-29 NOTE — Patient Instructions (Signed)
Access Code: [email or web address] URL: [email or web address] Date: 06/29/2020 Prepared by: Sherlyn Lees  Exercises Cervical Extension AROM with Strap - 1 x daily - 7 x weekly - 3 sets - 10 reps Seated Assisted Cervical Rotation with Towel - 1 x daily - 7 x weekly - 3 sets - 10 reps

## 2020-06-30 ENCOUNTER — Encounter: Payer: Self-pay | Admitting: Orthopaedic Surgery

## 2020-06-30 ENCOUNTER — Ambulatory Visit (INDEPENDENT_AMBULATORY_CARE_PROVIDER_SITE_OTHER): Payer: BC Managed Care – PPO | Admitting: Orthopaedic Surgery

## 2020-06-30 ENCOUNTER — Ambulatory Visit: Payer: Self-pay

## 2020-06-30 DIAGNOSIS — M79642 Pain in left hand: Secondary | ICD-10-CM

## 2020-06-30 DIAGNOSIS — N2581 Secondary hyperparathyroidism of renal origin: Secondary | ICD-10-CM | POA: Diagnosis not present

## 2020-06-30 DIAGNOSIS — N186 End stage renal disease: Secondary | ICD-10-CM | POA: Diagnosis not present

## 2020-06-30 DIAGNOSIS — Z992 Dependence on renal dialysis: Secondary | ICD-10-CM | POA: Diagnosis not present

## 2020-06-30 DIAGNOSIS — F4310 Post-traumatic stress disorder, unspecified: Secondary | ICD-10-CM | POA: Diagnosis not present
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
## 2020-07-02 DIAGNOSIS — N2581 Secondary hyperparathyroidism of renal origin: Secondary | ICD-10-CM | POA: Diagnosis not present

## 2020-07-02 DIAGNOSIS — N186 End stage renal disease: Secondary | ICD-10-CM | POA: Diagnosis not present

## 2020-07-02 DIAGNOSIS — Z992 Dependence on renal dialysis: Secondary | ICD-10-CM | POA: Diagnosis not present

## 2020-07-04 DIAGNOSIS — N186 End stage renal disease: Secondary | ICD-10-CM | POA: Diagnosis not present

## 2020-07-04 DIAGNOSIS — Z992 Dependence on renal dialysis: Secondary | ICD-10-CM | POA: Diagnosis not present

## 2020-07-04 DIAGNOSIS — N2581 Secondary hyperparathyroidism of renal origin: Secondary | ICD-10-CM | POA: Diagnosis not present

## 2020-07-05 DIAGNOSIS — N186 End stage renal disease: Secondary | ICD-10-CM | POA: Diagnosis not present

## 2020-07-05 DIAGNOSIS — Z992 Dependence on renal dialysis: Secondary | ICD-10-CM | POA: Diagnosis not present

## 2020-07-05 DIAGNOSIS — N2581 Secondary hyperparathyroidism of renal origin: Secondary | ICD-10-CM | POA: Diagnosis not present

## 2020-07-07 DIAGNOSIS — N2581 Secondary hyperparathyroidism of renal origin: Secondary | ICD-10-CM | POA: Diagnosis not present

## 2020-07-07 DIAGNOSIS — Z992 Dependence on renal dialysis: Secondary | ICD-10-CM | POA: Diagnosis not present

## 2020-07-07 DIAGNOSIS — N186 End stage renal disease: Secondary | ICD-10-CM | POA: Diagnosis not present

## 2020-07-09 DIAGNOSIS — N2581 Secondary hyperparathyroidism of renal origin: Secondary | ICD-10-CM | POA: Diagnosis not present

## 2020-07-09 DIAGNOSIS — N186 End stage renal disease: Secondary | ICD-10-CM | POA: Diagnosis not present

## 2020-07-09 DIAGNOSIS — Z992 Dependence on renal dialysis: Secondary | ICD-10-CM | POA: Diagnosis not present

## 2020-07-12 DIAGNOSIS — N186 End stage renal disease: Secondary | ICD-10-CM | POA: Diagnosis not present

## 2020-07-12 DIAGNOSIS — Z992 Dependence on renal dialysis: Secondary | ICD-10-CM | POA: Diagnosis not present

## 2020-07-12 DIAGNOSIS — N2581 Secondary hyperparathyroidism of renal origin: Secondary | ICD-10-CM | POA: Diagnosis not present

## 2020-07-14 DIAGNOSIS — Z992 Dependence on renal dialysis: Secondary | ICD-10-CM | POA: Diagnosis not present

## 2020-07-14 DIAGNOSIS — N2581 Secondary hyperparathyroidism of renal origin: Secondary | ICD-10-CM | POA: Diagnosis not present

## 2020-07-14 DIAGNOSIS — N186 End stage renal disease: Secondary | ICD-10-CM | POA: Diagnosis not present

## 2020-07-15 DIAGNOSIS — Z125 Encounter for screening for malignant neoplasm of prostate: Secondary | ICD-10-CM | POA: Diagnosis not present

## 2020-07-15 DIAGNOSIS — Z992 Dependence on renal dialysis: Secondary | ICD-10-CM | POA: Diagnosis not present

## 2020-07-15 DIAGNOSIS — Z1159 Encounter for screening for other viral diseases: Secondary | ICD-10-CM | POA: Diagnosis not present

## 2020-07-15 DIAGNOSIS — Z01810 Encounter for preprocedural cardiovascular examination: Secondary | ICD-10-CM | POA: Diagnosis not present

## 2020-07-15 DIAGNOSIS — N186 End stage renal disease: Secondary | ICD-10-CM | POA: Diagnosis not present

## 2020-07-15 DIAGNOSIS — I120 Hypertensive chronic kidney disease with stage 5 chronic kidney disease or end stage renal disease: Secondary | ICD-10-CM | POA: Diagnosis not present

## 2020-07-15 DIAGNOSIS — Z01818 Encounter for other preprocedural examination: Secondary | ICD-10-CM | POA: Diagnosis not present

## 2020-07-15 DIAGNOSIS — Z7682 Awaiting organ transplant status: Secondary | ICD-10-CM | POA: Diagnosis not present

## 2020-07-15 DIAGNOSIS — I10 Essential (primary) hypertension: Secondary | ICD-10-CM | POA: Diagnosis not present

## 2020-07-15 DIAGNOSIS — Z114 Encounter for screening for human immunodeficiency virus [HIV]: Secondary | ICD-10-CM | POA: Diagnosis not present

## 2020-07-16 DIAGNOSIS — N186 End stage renal disease: Secondary | ICD-10-CM | POA: Diagnosis not present

## 2020-07-16 DIAGNOSIS — Z992 Dependence on renal dialysis: Secondary | ICD-10-CM | POA: Diagnosis not present

## 2020-07-16 DIAGNOSIS — N2581 Secondary hyperparathyroidism of renal origin: Secondary | ICD-10-CM | POA: Diagnosis not present

## 2020-07-19 DIAGNOSIS — N2581 Secondary hyperparathyroidism of renal origin: Secondary | ICD-10-CM | POA: Diagnosis not present

## 2020-07-19 DIAGNOSIS — N186 End stage renal disease: Secondary | ICD-10-CM | POA: Diagnosis not present

## 2020-07-19 DIAGNOSIS — Z992 Dependence on renal dialysis: Secondary | ICD-10-CM | POA: Diagnosis not present

## 2020-07-20 ENCOUNTER — Ambulatory Visit (HOSPITAL_COMMUNITY): Payer: BC Managed Care – PPO

## 2020-07-20 ENCOUNTER — Other Ambulatory Visit: Payer: Self-pay

## 2020-07-20 DIAGNOSIS — R262 Difficulty in walking, not elsewhere classified: Secondary | ICD-10-CM

## 2020-07-20 DIAGNOSIS — M542 Cervicalgia: Secondary | ICD-10-CM | POA: Diagnosis not present

## 2020-07-20 DIAGNOSIS — M545 Low back pain, unspecified: Secondary | ICD-10-CM

## 2020-07-20 NOTE — Therapy (Signed)
[facility] [address] [place], [place], [address] Phone: [phone]   Fax:  [phone]  Physical Therapy Treatment and D/C Summary  Patient Details  Name: Dylan [name] MRN: [id number] Date of Birth: [birth year] Referring Provider (PT): Leandrew Koyanagi, MD  PHYSICAL THERAPY DISCHARGE SUMMARY  Visits from Start of Care: 2  Current functional level related to goals / functional outcomes: Able to meet 3/3 STG and 2/3 STG and demonstrating good healing since initial visit with marked improvement in soft tissue injury of neck   Remaining deficits: 10% restriction left cervical rotation/sidebending   Education / Equipment: HEP for cervical spine Plan: Patient agrees to discharge.  Patient goals were partially met. Patient is being discharged due to being pleased with the current functional level.  ?????        .Encounter Date: 07/20/2020   PT End of Session - 07/20/20 1652    Visit Number 2    Number of Visits 12    Date for PT Re-Evaluation 08/10/20    Authorization Type [employer] PPO, no auth, VL 30 PT/OT/Chiro    Authorization - Visit Number 2    Authorization - Number of Visits 30    Progress Note Due on Visit 10    PT Start Time 1602    PT Stop Time 1645    PT Time Calculation (min) 43 min    Activity Tolerance Patient limited by pain    Behavior During Therapy WFL for tasks assessed/performed           Past Medical History:  Diagnosis Date  . Anemia   . Bilateral chronic knee pain 07/04/2018  . Chronic pain of left ankle 07/04/2018  . ESRD (end stage renal disease) ([facility]) 03/29/2016  . Glucosuria 07/04/2018  . History of anemia due to CKD 03/29/2016  . Hyperkalemia, diminished renal excretion 03/29/2016  . Hypertension   . Hypertensive nephropathy 06/11/2018  . 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 Renal disorder   . Uremia of renal origin 03/29/2016    Past Surgical History:   Procedure Laterality Date  . BASCILIC VEIN TRANSPOSITION Left 04/06/2016   Procedure: Left Arm Single Stage Bascilic Vein Transposition;  Surgeon: Waynetta Sandy, MD;  Location: [facility];  Service: Vascular;  Laterality: Left;  . INSERTION OF DIALYSIS CATHETER Right 04/06/2016   Procedure: INSERTION OF Tunneled DIALYSIS CATHETER RIGHT INTERNAL JUGULAR;  Surgeon: Waynetta Sandy, MD;  Location: [facility];  Service: Vascular;  Laterality: Right;  . IR GENERIC HISTORICAL  06/26/2016   IR REMOVAL TUN CV CATH W/O FL 06/26/2016 Arne Cleveland, MD [facility]  . TOOTH EXTRACTION  2020    There were no vitals filed for this visit.   Subjective Assessment - 07/20/20 1623    Subjective Patient reports feeling better and able to move his head and neck with less pain and his low back is feeling better.  Pt notes his left ring finger is very sensitive and painful    Currently in Pain? Yes    Pain Score 3     Pain Location Neck    Pain Orientation Left    Pain Descriptors / Indicators Aching    Pain Type Acute pain    Pain Onset 1 to 4 weeks ago    Pain Score 1    Pain Location Coccyx    Pain Descriptors / Indicators Aching    Pain Onset 1 to 4 weeks ago  [facility] PT Assessment - 07/20/20 0001      Assessment   Medical Diagnosis neck pain/low back pain    Referring Provider (PT) Leandrew Koyanagi, MD    Onset Date/Surgical Date 06/07/20      AROM   Cervical Flexion WNL    Cervical Extension WNL   notes a feeling of pressure   Cervical - Right Side Bend WNL    Cervical - Left Side Bend 10% limited    Cervical - Right Rotation WNL    Cervical - Left Rotation 10% limited    Lumbar Flexion WNL    Lumbar Extension WNL      Palpation   Palpation comment improve suppleness of neck appreciated with no appreciable guarding or trigger points identified. No hypersensitity to touch      Special Tests   Other special tests No response to Tinel's sign at left ulnar eminence  or cubital tunnel. Increase ring finger pain with wrist extension. No response to Phalen's test      Ambulation/Gait   Ambulation/Gait Yes    Ambulation/Gait Assistance 7: Independent    Ambulation Distance (Feet) 300 Feet    Assistive device None    Gait Pattern Within Functional Limits    Ambulation Surface Level    Gait Comments 2MWT                         [facility] Adult PT Treatment/Exercise - 07/20/20 0001      Neck Exercises: Seated   Cervical Isometrics Flexion;3 secs;10 reps    Neck Retraction 20 reps;3 secs    Other Seated Exercise cervical extension and left sidebend/rotation SNAG with towel                  PT Education - 07/20/20 1651    Education Details Education regarding POC and discussion with pt regarding symptoms and limitations.    Person(s) Educated Patient   interpreter   Methods Explanation;Demonstration;Handout    Comprehension Verbalized understanding;Returned demonstration            PT Short Term Goals - 07/20/20 1654      PT SHORT TERM GOAL #1   Title Patient will report at least 50% improvement in overall symptoms and function to demonstrate overall improved functional ability    Baseline 75% better    Time 3    Period Weeks    Status Achieved    Target Date 07/20/20      PT SHORT TERM GOAL #2   Title Patient will increase left cervical rotation and sidebending to 25% limited to decrease pain and improve ability to turn head/neck whilst driving    Baseline 83% limited; 10% limited currently    Time 3    Period Weeks    Status Achieved    Target Date 07/20/20      PT SHORT TERM GOAL #3   Title Patient will report decreased pain as evidenced by ability to sleep throughout the night without waking from neck pain    Baseline reports he can usually sleep without his neck waking him    Time 3    Period Weeks    Status Achieved    Target Date 07/20/20             PT Long Term Goals - 07/20/20 1655      PT LONG  TERM GOAL #1   Title Patient will demonstrate full, painfree cervical ROM to improve comfort  when driving    Baseline 47% restriction left sidebending/rotation    Time 6    Period Weeks    Status Partially Met      PT LONG TERM GOAL #2   Title Patient will demonstrate improved ambulation velocity and tolerance as evidenced by 300 ft distance during    Baseline No issues with ambulation reported    Time 6    Period Weeks    Status Achieved                 Plan - 07/20/20 1652    Clinical Impression Statement Presents with global improvements in cervical ROM and minimal guarding in left cervical column appreciated and no areas of hypersensitivty or TTP.  Exhibits restoration of ROM with minimal restriction in left side bending and rotation with pt chief complaint being "some tightness".  Pt feels like he has improved to the degree that he does not require further PT intervention at this time and is comfortable in proceeding on his own with current HEP    Personal Factors and Comorbidities Time since onset of injury/illness/exacerbation    Examination-Activity Limitations Bend;Carry;Lift;Stand;Squat;Locomotion Level;Sleep    Examination-Participation Restrictions Community Activity;Driving;Shop;Occupation;Cleaning    Stability/Clinical Decision Making Stable/Uncomplicated    Rehab Potential Good    PT Frequency --   1-2x/wk   PT Duration 6 weeks    PT Treatment/Interventions ADLs/Self Care Home Management;Aquatic Therapy;Cryotherapy;Ship broker;Iontophoresis 4mg /ml Dexamethasone;Stair training;Functional mobility training;Therapeutic activities;Therapeutic exercise;Balance training;Patient/family education;Neuromuscular re-education;Manual techniques;Passive range of motion;Taping;Energy conservation;Spinal Manipulations;Dry needling;Joint Manipulations    PT Next Visit Plan address lumbar spine, continue with soft tissue  mobilization to cervical spine    PT Home Exercise Plan cervical and side bending SNAG with towel. Chin retractions, lateral flexion stretch    Consulted and Agree with Plan of Care Patient;Other (Comment)   interpreter          Patient will benefit from skilled therapeutic intervention in order to improve the following deficits and impairments:  Abnormal gait,Decreased activity tolerance,Decreased mobility,Decreased range of motion,Decreased strength,Hypomobility,Difficulty walking,Increased muscle spasms,Postural dysfunction,Pain  Visit Diagnosis: Neck pain  Acute low back pain, unspecified back pain laterality, unspecified whether sciatica present  Difficulty in walking, not elsewhere classified     Problem List Patient Active Problem List   Diagnosis Date Noted  . Seizure (HCC) 09/18/2019  . Encounter for screening colonoscopy 04/17/2019  . Screening for viral disease 04/17/2019  . ESRD on hemodialysis (HCC) 03/15/2019  . Primary osteoarthritis of right knee 01/14/2019  . Primary osteoarthritis of left knee 01/14/2019  . Effusion, right knee 01/14/2019  . LVH (left ventricular hypertrophy) 12/27/2018  . Aftercare including intermittent dialysis (HCC) 08/28/2018  . Acidosis 08/28/2018  . Anemia in chronic kidney disease 08/28/2018  . Arteriovenous fistula, acquired (HCC) 08/28/2018  . Chronic pain of left ankle 07/04/2018  . Bilateral chronic knee pain 07/04/2018  . Glucosuria 07/04/2018  . Hypertensive nephropathy 06/11/2018  . Hyperkalemia, diminished renal excretion 03/29/2016  . ESRD (end stage renal disease) (HCC) 03/29/2016  . Uremia of renal origin 03/29/2016  . HTN (hypertension) 03/29/2016  . 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 History of anemia due to CKD 03/29/2016   5:01 PM, 07/20/20 M. 07/22/20, PT, DPT Physical Therapist- [facility] Office Number: [phone]  [facility] [address] [place], [place], [place] Phone: [phone]   Fax:  [phone]  Name: Dylan [name] MRN: Ardath Sax Date of Birth: [birth year]

## 2020-07-21 DIAGNOSIS — Z992 Dependence on renal dialysis: Secondary | ICD-10-CM | POA: Diagnosis not present

## 2020-07-21 DIAGNOSIS — N186 End stage renal disease: Secondary | ICD-10-CM | POA: Diagnosis not present

## 2020-07-21 DIAGNOSIS — N2581 Secondary hyperparathyroidism of renal origin: Secondary | ICD-10-CM | POA: Diagnosis not present

## 2020-07-22 ENCOUNTER — Encounter (HOSPITAL_COMMUNITY): Payer: BC Managed Care – PPO

## 2020-07-23 DIAGNOSIS — N186 End stage renal disease: Secondary | ICD-10-CM | POA: Diagnosis not present

## 2020-07-23 DIAGNOSIS — Z992 Dependence on renal dialysis: Secondary | ICD-10-CM | POA: Diagnosis not present

## 2020-07-23 DIAGNOSIS — N2581 Secondary hyperparathyroidism of renal origin: Secondary | ICD-10-CM | POA: Diagnosis not present

## 2020-07-26 DIAGNOSIS — N2581 Secondary hyperparathyroidism of renal origin: Secondary | ICD-10-CM | POA: Diagnosis not present

## 2020-07-26 DIAGNOSIS — Z992 Dependence on renal dialysis: Secondary | ICD-10-CM | POA: Diagnosis not present

## 2020-07-26 DIAGNOSIS — N186 End stage renal disease: Secondary | ICD-10-CM | POA: Diagnosis not present

## 2020-07-27 ENCOUNTER — Encounter (HOSPITAL_COMMUNITY): Payer: BC Managed Care – PPO

## 2020-07-28 DIAGNOSIS — N2581 Secondary hyperparathyroidism of renal origin: Secondary | ICD-10-CM | POA: Diagnosis not present

## 2020-07-28 DIAGNOSIS — Z992 Dependence on renal dialysis: Secondary | ICD-10-CM | POA: Diagnosis not present

## 2020-07-28 DIAGNOSIS — N186 End stage renal disease: Secondary | ICD-10-CM | POA: Diagnosis not present

## 2020-07-29 ENCOUNTER — Encounter (HOSPITAL_COMMUNITY): Payer: BC Managed Care – PPO

## 2020-07-29 ENCOUNTER — Ambulatory Visit: Payer: BC Managed Care – PPO | Admitting: Nurse Practitioner

## 2020-07-29 DIAGNOSIS — I158 Other secondary hypertension: Secondary | ICD-10-CM | POA: Diagnosis not present

## 2020-07-29 DIAGNOSIS — Z992 Dependence on renal dialysis: Secondary | ICD-10-CM | POA: Diagnosis not present

## 2020-07-29 DIAGNOSIS — N186 End stage renal disease: Secondary | ICD-10-CM | POA: Diagnosis not present

## 2020-07-30 DIAGNOSIS — N2581 Secondary hyperparathyroidism of renal origin: Secondary | ICD-10-CM | POA: Diagnosis not present

## 2020-07-30 DIAGNOSIS — N186 End stage renal disease: Secondary | ICD-10-CM | POA: Diagnosis not present

## 2020-07-30 DIAGNOSIS — Z992 Dependence on renal dialysis: Secondary | ICD-10-CM | POA: Diagnosis not present

## 2020-08-02 DIAGNOSIS — Z992 Dependence on renal dialysis: Secondary | ICD-10-CM | POA: Diagnosis not present

## 2020-08-02 DIAGNOSIS — N2581 Secondary hyperparathyroidism of renal origin: Secondary | ICD-10-CM | POA: Diagnosis not present

## 2020-08-02 DIAGNOSIS — N186 End stage renal disease: Secondary | ICD-10-CM | POA: Diagnosis not present

## 2020-08-03 ENCOUNTER — Encounter (HOSPITAL_COMMUNITY): Payer: BC Managed Care – PPO

## 2020-08-04 DIAGNOSIS — N2581 Secondary hyperparathyroidism of renal origin: Secondary | ICD-10-CM | POA: Diagnosis not present

## 2020-08-04 DIAGNOSIS — N186 End stage renal disease: Secondary | ICD-10-CM | POA: Diagnosis not present

## 2020-08-04 DIAGNOSIS — Z992 Dependence on renal dialysis: Secondary | ICD-10-CM | POA: Diagnosis not present

## 2020-08-05 ENCOUNTER — Encounter (HOSPITAL_COMMUNITY): Payer: BC Managed Care – PPO | Admitting: Physical Therapy

## 2020-08-06 DIAGNOSIS — N2581 Secondary hyperparathyroidism of renal origin: Secondary | ICD-10-CM | POA: Diagnosis not present

## 2020-08-06 DIAGNOSIS — Z992 Dependence on renal dialysis: Secondary | ICD-10-CM | POA: Diagnosis not present

## 2020-08-06 DIAGNOSIS — N186 End stage renal disease: Secondary | ICD-10-CM | POA: Diagnosis not present

## 2020-08-09 DIAGNOSIS — Z992 Dependence on renal dialysis: Secondary | ICD-10-CM | POA: Diagnosis not present

## 2020-08-09 DIAGNOSIS — N186 End stage renal disease: Secondary | ICD-10-CM | POA: Diagnosis not present

## 2020-08-09 DIAGNOSIS — N2581 Secondary hyperparathyroidism of renal origin: Secondary | ICD-10-CM | POA: Diagnosis not present

## 2020-08-10 ENCOUNTER — Encounter (HOSPITAL_COMMUNITY): Payer: BC Managed Care – PPO

## 2020-08-11 DIAGNOSIS — N2581 Secondary hyperparathyroidism of renal origin: Secondary | ICD-10-CM | POA: Diagnosis not present

## 2020-08-11 DIAGNOSIS — N186 End stage renal disease: Secondary | ICD-10-CM | POA: Diagnosis not present

## 2020-08-11 DIAGNOSIS — Z992 Dependence on renal dialysis: Secondary | ICD-10-CM | POA: Diagnosis not present

## 2020-08-12 ENCOUNTER — Encounter (HOSPITAL_COMMUNITY): Payer: BC Managed Care – PPO

## 2020-08-13 DIAGNOSIS — N186 End stage renal disease: Secondary | ICD-10-CM | POA: Diagnosis not present

## 2020-08-13 DIAGNOSIS — N2581 Secondary hyperparathyroidism of renal origin: Secondary | ICD-10-CM | POA: Diagnosis not present

## 2020-08-13 DIAGNOSIS — Z992 Dependence on renal dialysis: Secondary | ICD-10-CM | POA: Diagnosis not present

## 2020-08-16 DIAGNOSIS — Z992 Dependence on renal dialysis: Secondary | ICD-10-CM | POA: Diagnosis not present

## 2020-08-16 DIAGNOSIS — N2581 Secondary hyperparathyroidism of renal origin: Secondary | ICD-10-CM | POA: Diagnosis not present

## 2020-08-16 DIAGNOSIS — N186 End stage renal disease: Secondary | ICD-10-CM | POA: Diagnosis not present

## 2020-08-17 ENCOUNTER — Encounter (HOSPITAL_COMMUNITY): Payer: BC Managed Care – PPO

## 2020-08-18 DIAGNOSIS — Z992 Dependence on renal dialysis: Secondary | ICD-10-CM | POA: Diagnosis not present

## 2020-08-18 DIAGNOSIS — N186 End stage renal disease: Secondary | ICD-10-CM | POA: Diagnosis not present

## 2020-08-18 DIAGNOSIS — N2581 Secondary hyperparathyroidism of renal origin: Secondary | ICD-10-CM | POA: Diagnosis not present

## 2020-08-19 ENCOUNTER — Encounter (HOSPITAL_COMMUNITY): Payer: BC Managed Care – PPO

## 2020-08-20 DIAGNOSIS — N186 End stage renal disease: Secondary | ICD-10-CM | POA: Diagnosis not present

## 2020-08-20 DIAGNOSIS — Z992 Dependence on renal dialysis: Secondary | ICD-10-CM | POA: Diagnosis not present

## 2020-08-20 DIAGNOSIS — N2581 Secondary hyperparathyroidism of renal origin: Secondary | ICD-10-CM | POA: Diagnosis not present

## 2020-08-23 DIAGNOSIS — N186 End stage renal disease: Secondary | ICD-10-CM | POA: Diagnosis not present

## 2020-08-23 DIAGNOSIS — N2581 Secondary hyperparathyroidism of renal origin: Secondary | ICD-10-CM | POA: Diagnosis not present

## 2020-08-23 DIAGNOSIS — Z992 Dependence on renal dialysis: Secondary | ICD-10-CM | POA: Diagnosis not present

## 2020-08-24 ENCOUNTER — Encounter (HOSPITAL_COMMUNITY): Payer: BC Managed Care – PPO

## 2020-08-25 DIAGNOSIS — N2581 Secondary hyperparathyroidism of renal origin: Secondary | ICD-10-CM | POA: Diagnosis not present

## 2020-08-25 DIAGNOSIS — N186 End stage renal disease: Secondary | ICD-10-CM | POA: Diagnosis not present

## 2020-08-25 DIAGNOSIS — Z992 Dependence on renal dialysis: Secondary | ICD-10-CM | POA: Diagnosis not present

## 2020-08-26 ENCOUNTER — Encounter (HOSPITAL_COMMUNITY): Payer: BC Managed Care – PPO | Admitting: Physical Therapy

## 2020-08-27 DIAGNOSIS — N2581 Secondary hyperparathyroidism of renal origin: Secondary | ICD-10-CM | POA: Diagnosis not present

## 2020-08-27 DIAGNOSIS — Z992 Dependence on renal dialysis: Secondary | ICD-10-CM | POA: Diagnosis not present

## 2020-08-27 DIAGNOSIS — N186 End stage renal disease: Secondary | ICD-10-CM | POA: Diagnosis not present

## 2020-08-28 DIAGNOSIS — N186 End stage renal disease: Secondary | ICD-10-CM | POA: Diagnosis not present

## 2020-08-28 DIAGNOSIS — I158 Other secondary hypertension: Secondary | ICD-10-CM | POA: Diagnosis not present

## 2020-08-28 DIAGNOSIS — Z992 Dependence on renal dialysis: Secondary | ICD-10-CM | POA: Diagnosis not present

## 2020-08-30 DIAGNOSIS — N186 End stage renal disease: Secondary | ICD-10-CM | POA: Diagnosis not present

## 2020-08-30 DIAGNOSIS — N2581 Secondary hyperparathyroidism of renal origin: Secondary | ICD-10-CM | POA: Diagnosis not present

## 2020-08-30 DIAGNOSIS — Z992 Dependence on renal dialysis: Secondary | ICD-10-CM | POA: Diagnosis not present

## 2020-09-01 DIAGNOSIS — N186 End stage renal disease: Secondary | ICD-10-CM | POA: Diagnosis not present

## 2020-09-01 DIAGNOSIS — Z992 Dependence on renal dialysis: Secondary | ICD-10-CM | POA: Diagnosis not present

## 2020-09-01 DIAGNOSIS — N2581 Secondary hyperparathyroidism of renal origin: Secondary | ICD-10-CM | POA: Diagnosis not present

## 2020-09-03 DIAGNOSIS — N2581 Secondary hyperparathyroidism of renal origin: Secondary | ICD-10-CM | POA: Diagnosis not present

## 2020-09-03 DIAGNOSIS — N186 End stage renal disease: Secondary | ICD-10-CM | POA: Diagnosis not present

## 2020-09-03 DIAGNOSIS — Z992 Dependence on renal dialysis: Secondary | ICD-10-CM | POA: Diagnosis not present

## 2020-09-06 DIAGNOSIS — N186 End stage renal disease: Secondary | ICD-10-CM | POA: Diagnosis not present

## 2020-09-06 DIAGNOSIS — Z992 Dependence on renal dialysis: Secondary | ICD-10-CM | POA: Diagnosis not present

## 2020-09-06 DIAGNOSIS — N2581 Secondary hyperparathyroidism of renal origin: Secondary | ICD-10-CM | POA: Diagnosis not present

## 2020-09-08 DIAGNOSIS — N2581 Secondary hyperparathyroidism of renal origin: Secondary | ICD-10-CM | POA: Diagnosis not present

## 2020-09-08 DIAGNOSIS — Z992 Dependence on renal dialysis: Secondary | ICD-10-CM | POA: Diagnosis not present

## 2020-09-08 DIAGNOSIS — N186 End stage renal disease: Secondary | ICD-10-CM | POA: Diagnosis not present

## 2020-09-10 DIAGNOSIS — N2581 Secondary hyperparathyroidism of renal origin: Secondary | ICD-10-CM | POA: Diagnosis not present

## 2020-09-10 DIAGNOSIS — N186 End stage renal disease: Secondary | ICD-10-CM | POA: Diagnosis not present

## 2020-09-10 DIAGNOSIS — Z992 Dependence on renal dialysis: Secondary | ICD-10-CM | POA: Diagnosis not present

## 2020-09-13 DIAGNOSIS — N186 End stage renal disease: Secondary | ICD-10-CM | POA: Diagnosis not present

## 2020-09-13 DIAGNOSIS — N2581 Secondary hyperparathyroidism of renal origin: Secondary | ICD-10-CM | POA: Diagnosis not present

## 2020-09-13 DIAGNOSIS — Z992 Dependence on renal dialysis: Secondary | ICD-10-CM | POA: Diagnosis not present

## 2020-09-15 DIAGNOSIS — N186 End stage renal disease: Secondary | ICD-10-CM | POA: Diagnosis not present

## 2020-09-15 DIAGNOSIS — N2581 Secondary hyperparathyroidism of renal origin: Secondary | ICD-10-CM | POA: Diagnosis not present

## 2020-09-15 DIAGNOSIS — Z992 Dependence on renal dialysis: Secondary | ICD-10-CM | POA: Diagnosis not present

## 2020-09-15 DIAGNOSIS — Z7682 Awaiting organ transplant status: Secondary | ICD-10-CM | POA: Diagnosis not present

## 2020-09-17 DIAGNOSIS — N2581 Secondary hyperparathyroidism of renal origin: Secondary | ICD-10-CM | POA: Diagnosis not present

## 2020-09-17 DIAGNOSIS — N186 End stage renal disease: Secondary | ICD-10-CM | POA: Diagnosis not present

## 2020-09-17 DIAGNOSIS — Z992 Dependence on renal dialysis: Secondary | ICD-10-CM | POA: Diagnosis not present

## 2020-09-20 DIAGNOSIS — Z992 Dependence on renal dialysis: Secondary | ICD-10-CM | POA: Diagnosis not present

## 2020-09-20 DIAGNOSIS — N2581 Secondary hyperparathyroidism of renal origin: Secondary | ICD-10-CM | POA: Diagnosis not present

## 2020-09-20 DIAGNOSIS — N186 End stage renal disease: Secondary | ICD-10-CM | POA: Diagnosis not present

## 2020-09-22 DIAGNOSIS — Z992 Dependence on renal dialysis: Secondary | ICD-10-CM | POA: Diagnosis not present

## 2020-09-22 DIAGNOSIS — N186 End stage renal disease: Secondary | ICD-10-CM | POA: Diagnosis not present

## 2020-09-22 DIAGNOSIS — N2581 Secondary hyperparathyroidism of renal origin: Secondary | ICD-10-CM | POA: Diagnosis not present

## 2020-09-23 DIAGNOSIS — Z7682 Awaiting organ transplant status: Secondary | ICD-10-CM | POA: Diagnosis not present

## 2020-09-24 DIAGNOSIS — N2581 Secondary hyperparathyroidism of renal origin: Secondary | ICD-10-CM | POA: Diagnosis not present

## 2020-09-24 DIAGNOSIS — N186 End stage renal disease: Secondary | ICD-10-CM | POA: Diagnosis not present

## 2020-09-24 DIAGNOSIS — Z992 Dependence on renal dialysis: Secondary | ICD-10-CM | POA: Diagnosis not present

## 2020-09-27 DIAGNOSIS — Z992 Dependence on renal dialysis: Secondary | ICD-10-CM | POA: Diagnosis not present

## 2020-09-27 DIAGNOSIS — N186 End stage renal disease: Secondary | ICD-10-CM | POA: Diagnosis not present

## 2020-09-27 DIAGNOSIS — N2581 Secondary hyperparathyroidism of renal origin: Secondary | ICD-10-CM | POA: Diagnosis not present

## 2020-09-28 DIAGNOSIS — I158 Other secondary hypertension: Secondary | ICD-10-CM | POA: Diagnosis not present

## 2020-09-28 DIAGNOSIS — Z992 Dependence on renal dialysis: Secondary | ICD-10-CM | POA: Diagnosis not present

## 2020-09-28 DIAGNOSIS — N186 End stage renal disease: Secondary | ICD-10-CM | POA: Diagnosis not present

## 2020-09-29 DIAGNOSIS — Z992 Dependence on renal dialysis: Secondary | ICD-10-CM | POA: Diagnosis not present

## 2020-09-29 DIAGNOSIS — N2581 Secondary hyperparathyroidism of renal origin: Secondary | ICD-10-CM | POA: Diagnosis not present

## 2020-09-29 DIAGNOSIS — N186 End stage renal disease: Secondary | ICD-10-CM | POA: Diagnosis not present

## 2020-10-01 DIAGNOSIS — Z992 Dependence on renal dialysis: Secondary | ICD-10-CM | POA: Diagnosis not present

## 2020-10-01 DIAGNOSIS — N186 End stage renal disease: Secondary | ICD-10-CM | POA: Diagnosis not present

## 2020-10-01 DIAGNOSIS — N2581 Secondary hyperparathyroidism of renal origin: Secondary | ICD-10-CM | POA: Diagnosis not present

## 2020-10-04 DIAGNOSIS — N186 End stage renal disease: Secondary | ICD-10-CM | POA: Diagnosis not present

## 2020-10-04 DIAGNOSIS — N2581 Secondary hyperparathyroidism of renal origin: Secondary | ICD-10-CM | POA: Diagnosis not present

## 2020-10-04 DIAGNOSIS — Z992 Dependence on renal dialysis: Secondary | ICD-10-CM | POA: Diagnosis not present

## 2020-10-06 DIAGNOSIS — N186 End stage renal disease: Secondary | ICD-10-CM | POA: Diagnosis not present

## 2020-10-06 DIAGNOSIS — Z992 Dependence on renal dialysis: Secondary | ICD-10-CM | POA: Diagnosis not present

## 2020-10-06 DIAGNOSIS — N2581 Secondary hyperparathyroidism of renal origin: Secondary | ICD-10-CM | POA: Diagnosis not present

## 2020-10-08 DIAGNOSIS — N2581 Secondary hyperparathyroidism of renal origin: Secondary | ICD-10-CM | POA: Diagnosis not present

## 2020-10-08 DIAGNOSIS — Z992 Dependence on renal dialysis: Secondary | ICD-10-CM | POA: Diagnosis not present

## 2020-10-08 DIAGNOSIS — N186 End stage renal disease: Secondary | ICD-10-CM | POA: Diagnosis not present

## 2020-10-11 DIAGNOSIS — N186 End stage renal disease: Secondary | ICD-10-CM | POA: Diagnosis not present

## 2020-10-11 DIAGNOSIS — Z992 Dependence on renal dialysis: Secondary | ICD-10-CM | POA: Diagnosis not present

## 2020-10-11 DIAGNOSIS — N2581 Secondary hyperparathyroidism of renal origin: Secondary | ICD-10-CM | POA: Diagnosis not present

## 2020-10-13 DIAGNOSIS — N2581 Secondary hyperparathyroidism of renal origin: Secondary | ICD-10-CM | POA: Diagnosis not present

## 2020-10-13 DIAGNOSIS — N186 End stage renal disease: Secondary | ICD-10-CM | POA: Diagnosis not present

## 2020-10-13 DIAGNOSIS — Z992 Dependence on renal dialysis: Secondary | ICD-10-CM | POA: Diagnosis not present

## 2020-10-15 DIAGNOSIS — Z992 Dependence on renal dialysis: Secondary | ICD-10-CM | POA: Diagnosis not present

## 2020-10-15 DIAGNOSIS — N186 End stage renal disease: Secondary | ICD-10-CM | POA: Diagnosis not present

## 2020-10-15 DIAGNOSIS — N2581 Secondary hyperparathyroidism of renal origin: Secondary | ICD-10-CM | POA: Diagnosis not present

## 2020-10-18 DIAGNOSIS — Z992 Dependence on renal dialysis: Secondary | ICD-10-CM | POA: Diagnosis not present

## 2020-10-18 DIAGNOSIS — N186 End stage renal disease: Secondary | ICD-10-CM | POA: Diagnosis not present

## 2020-10-18 DIAGNOSIS — N2581 Secondary hyperparathyroidism of renal origin: Secondary | ICD-10-CM | POA: Diagnosis not present

## 2020-10-20 DIAGNOSIS — N186 End stage renal disease: Secondary | ICD-10-CM | POA: Diagnosis not present

## 2020-10-20 DIAGNOSIS — N2581 Secondary hyperparathyroidism of renal origin: Secondary | ICD-10-CM | POA: Diagnosis not present

## 2020-10-20 DIAGNOSIS — Z992 Dependence on renal dialysis: Secondary | ICD-10-CM | POA: Diagnosis not present

## 2020-10-22 DIAGNOSIS — Z992 Dependence on renal dialysis: Secondary | ICD-10-CM | POA: Diagnosis not present

## 2020-10-22 DIAGNOSIS — N2581 Secondary hyperparathyroidism of renal origin: Secondary | ICD-10-CM | POA: Diagnosis not present

## 2020-10-22 DIAGNOSIS — N186 End stage renal disease: Secondary | ICD-10-CM | POA: Diagnosis not present

## 2020-10-25 DIAGNOSIS — N186 End stage renal disease: Secondary | ICD-10-CM | POA: Diagnosis not present

## 2020-10-25 DIAGNOSIS — Z992 Dependence on renal dialysis: Secondary | ICD-10-CM | POA: Diagnosis not present

## 2020-10-25 DIAGNOSIS — N2581 Secondary hyperparathyroidism of renal origin: Secondary | ICD-10-CM | POA: Diagnosis not present

## 2020-10-27 DIAGNOSIS — Z992 Dependence on renal dialysis: Secondary | ICD-10-CM | POA: Diagnosis not present

## 2020-10-27 DIAGNOSIS — N186 End stage renal disease: Secondary | ICD-10-CM | POA: Diagnosis not present

## 2020-10-27 DIAGNOSIS — N2581 Secondary hyperparathyroidism of renal origin: Secondary | ICD-10-CM | POA: Diagnosis not present

## 2020-10-28 DIAGNOSIS — N186 End stage renal disease: Secondary | ICD-10-CM | POA: Diagnosis not present

## 2020-10-28 DIAGNOSIS — I158 Other secondary hypertension: Secondary | ICD-10-CM | POA: Diagnosis not present

## 2020-10-28 DIAGNOSIS — Z992 Dependence on renal dialysis: Secondary | ICD-10-CM | POA: Diagnosis not present

## 2020-10-29 DIAGNOSIS — Z992 Dependence on renal dialysis: Secondary | ICD-10-CM | POA: Diagnosis not present

## 2020-10-29 DIAGNOSIS — N2581 Secondary hyperparathyroidism of renal origin: Secondary | ICD-10-CM | POA: Diagnosis not present

## 2020-10-29 DIAGNOSIS — N186 End stage renal disease: Secondary | ICD-10-CM | POA: Diagnosis not present

## 2020-11-01 DIAGNOSIS — Z992 Dependence on renal dialysis: Secondary | ICD-10-CM | POA: Diagnosis not present

## 2020-11-01 DIAGNOSIS — N2581 Secondary hyperparathyroidism of renal origin: Secondary | ICD-10-CM | POA: Diagnosis not present

## 2020-11-01 DIAGNOSIS — N186 End stage renal disease: Secondary | ICD-10-CM | POA: Diagnosis not present

## 2020-11-03 DIAGNOSIS — N2581 Secondary hyperparathyroidism of renal origin: Secondary | ICD-10-CM | POA: Diagnosis not present

## 2020-11-03 DIAGNOSIS — N186 End stage renal disease: Secondary | ICD-10-CM | POA: Diagnosis not present

## 2020-11-03 DIAGNOSIS — Z992 Dependence on renal dialysis: Secondary | ICD-10-CM | POA: Diagnosis not present

## 2020-11-05 DIAGNOSIS — N2581 Secondary hyperparathyroidism of renal origin: Secondary | ICD-10-CM | POA: Diagnosis not present

## 2020-11-05 DIAGNOSIS — Z992 Dependence on renal dialysis: Secondary | ICD-10-CM | POA: Diagnosis not present

## 2020-11-05 DIAGNOSIS — N186 End stage renal disease: Secondary | ICD-10-CM | POA: Diagnosis not present

## 2020-11-08 DIAGNOSIS — N186 End stage renal disease: Secondary | ICD-10-CM | POA: Diagnosis not present

## 2020-11-08 DIAGNOSIS — Z992 Dependence on renal dialysis: Secondary | ICD-10-CM | POA: Diagnosis not present

## 2020-11-08 DIAGNOSIS — N2581 Secondary hyperparathyroidism of renal origin: Secondary | ICD-10-CM | POA: Diagnosis not present

## 2020-11-10 DIAGNOSIS — Z992 Dependence on renal dialysis: Secondary | ICD-10-CM | POA: Diagnosis not present

## 2020-11-10 DIAGNOSIS — N186 End stage renal disease: Secondary | ICD-10-CM | POA: Diagnosis not present

## 2020-11-10 DIAGNOSIS — N2581 Secondary hyperparathyroidism of renal origin: Secondary | ICD-10-CM | POA: Diagnosis not present

## 2020-11-12 DIAGNOSIS — N186 End stage renal disease: Secondary | ICD-10-CM | POA: Diagnosis not present

## 2020-11-12 DIAGNOSIS — N2581 Secondary hyperparathyroidism of renal origin: Secondary | ICD-10-CM | POA: Diagnosis not present

## 2020-11-12 DIAGNOSIS — Z992 Dependence on renal dialysis: Secondary | ICD-10-CM | POA: Diagnosis not present

## 2020-11-15 DIAGNOSIS — N186 End stage renal disease: Secondary | ICD-10-CM | POA: Diagnosis not present

## 2020-11-15 DIAGNOSIS — N2581 Secondary hyperparathyroidism of renal origin: Secondary | ICD-10-CM | POA: Diagnosis not present

## 2020-11-15 DIAGNOSIS — Z992 Dependence on renal dialysis: Secondary | ICD-10-CM | POA: Diagnosis not present

## 2020-11-17 DIAGNOSIS — N2581 Secondary hyperparathyroidism of renal origin: Secondary | ICD-10-CM | POA: Diagnosis not present

## 2020-11-17 DIAGNOSIS — Z992 Dependence on renal dialysis: Secondary | ICD-10-CM | POA: Diagnosis not present

## 2020-11-17 DIAGNOSIS — N186 End stage renal disease: Secondary | ICD-10-CM | POA: Diagnosis not present

## 2020-11-18 ENCOUNTER — Ambulatory Visit (INDEPENDENT_AMBULATORY_CARE_PROVIDER_SITE_OTHER): Payer: BC Managed Care – PPO | Admitting: Orthopaedic Surgery

## 2020-11-18 ENCOUNTER — Encounter: Payer: Self-pay | Admitting: Orthopaedic Surgery

## 2020-11-18 VITALS — Ht 67.0 in | Wt 203.0 lb

## 2020-11-18 DIAGNOSIS — M1712 Unilateral primary osteoarthritis, left knee: Secondary | ICD-10-CM

## 2020-11-18 DIAGNOSIS — M1711 Unilateral primary osteoarthritis, right knee: Secondary | ICD-10-CM | POA: Diagnosis not present

## 2020-11-18 DIAGNOSIS — M170 Bilateral primary osteoarthritis of knee: Secondary | ICD-10-CM

## 2020-11-18 MED ORDER — METHYLPREDNISOLONE ACETATE 40 MG/ML IJ SUSP
13.3300 mg | INTRAMUSCULAR | Status: AC | PRN
  Administered 2020-11-18: 13.33 mg via INTRA_ARTICULAR

## 2020-11-18 MED ORDER — LIDOCAINE HCL 1 % IJ SOLN
3.0000 mL | INTRAMUSCULAR | Status: AC | PRN
  Administered 2020-11-18: 3 mL

## 2020-11-18 MED ORDER — BUPIVACAINE HCL 0.25 % IJ SOLN
0.6600 mL | INTRAMUSCULAR | Status: AC | PRN
  Administered 2020-11-18: .66 mL via INTRA_ARTICULAR

## 2020-11-18 NOTE — Progress Notes (Signed)
Office Visit Note   Patient: Dylan [name]           Date of Birth: 01-24-1961           MRN: [id number] Visit Date: 11/18/2020              Requested by: Donato Heinz, MD [address] [place],  [place] [address] PCP: Donato Heinz, MD   Assessment & Plan: Visit Diagnoses:  1. Bilateral primary osteoarthritis of knee     Plan: Impression is recurrent bilateral knee osteoarthritis.  We have discussed repeat cortisone injections today as the patient had great relief from the previous injections back in September of last year.  He is agreeable to this plan.  He will follow-up with [place] as needed.  Call with concerns or questions.  Follow-Up Instructions: Return if symptoms worsen or fail to improve.   Orders:  Orders Placed This Encounter  Procedures   Large Joint Inj: bilateral knee   No orders of the defined types were placed in this encounter.     Procedures: Large Joint Inj: bilateral knee on 11/18/2020 8:30 AM Indications: pain Details: 22 G needle, anterolateral approach Medications (Right): 0.66 mL bupivacaine 0.25 %; 3 mL lidocaine 1 %; 13.33 mg methylPREDNISolone acetate 40 MG/ML Medications (Left): 0.66 mL bupivacaine 0.25 %; 3 mL lidocaine 1 %; 13.33 mg methylPREDNISolone acetate 40 MG/ML     Clinical Data: No additional findings.   Subjective: Chief Complaint  Patient presents with   Right Knee - Pain    HPI patient is a pleasant 60 year old Spanish-speaking gentleman is here today with an interpreter.  He is here with recurrent bilateral knee pain left greater than right.  He was seen in our office about a year ago for this.  X-rays demonstrated moderate degenerative changes to both knees.  Both knees were injected with cortisone.  He had great relief for several months.  His pain is returned and started to worsen.  The pain he has is worse at work as he is standing all day.  He denies any pain at night.  He does not take any medication for  this.  Review of Systems as detailed in HPI.  All others reviewed and are negative.   Objective: Vital Signs: Ht 5\' 7"  (1.702 m)   Wt 203 lb (92.1 kg)   BMI 31.79 kg/m   Physical Exam well-developed well-nourished gentleman in no acute distress.  Alert and oriented x3.  Ortho Exam bilateral knee exams show trace effusion.  Range of motion 0 to 115 degrees.  No joint line tenderness.  R patellofemoral crepitus.  Ligaments are stable.  He is neurovascular intact distally.  Specialty Comments:  No specialty comments available.  Imaging: No new imaging   PMFS History: Patient Active Problem List   Diagnosis Date Noted   Seizure ([facility]) 09/18/2019   Encounter for screening colonoscopy 04/17/2019   Screening for viral disease 04/17/2019   ESRD on hemodialysis ([facility]) 03/15/2019   Primary osteoarthritis of right knee 01/14/2019   Primary osteoarthritis of left knee 01/14/2019   Effusion, right knee 01/14/2019   LVH (left ventricular hypertrophy) 12/27/2018   Aftercare including intermittent dialysis ([facility]) 08/28/2018   Acidosis 08/28/2018   Anemia in chronic kidney disease 08/28/2018   Arteriovenous fistula, acquired ([place]) 08/28/2018   Chronic pain of left ankle 07/04/2018   Bilateral chronic knee pain 07/04/2018   Glucosuria 07/04/2018   Hypertensive nephropathy 06/11/2018   Hyperkalemia, diminished renal excretion 03/29/2016   ESRD (end  stage renal disease) ([place]) 03/29/2016   Uremia of renal origin 03/29/2016   HTN (hypertension) 72/62/0355   Metabolic acidosis, NAG, 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   History of anemia due to CKD 03/29/2016   Past Medical History:  Diagnosis Date   Anemia    Bilateral chronic knee pain 07/04/2018   Chronic pain of left ankle 07/04/2018   ESRD (end stage renal disease) ([place]) 03/29/2016   Glucosuria 07/04/2018   History of anemia due to CKD 03/29/2016   Hyperkalemia, diminished renal excretion 03/29/2016   Hypertension     Hypertensive nephropathy 9/74/1638   Metabolic acidosis, NAG, 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   Renal disorder    Uremia of renal origin 03/29/2016    Family History  Problem Relation Age of Onset   Breast cancer Mother    Prostate cancer Father    Diabetes Brother    Breast cancer Sister    Colon cancer Neg Hx    Esophageal cancer Neg Hx    Inflammatory bowel disease Neg Hx    Liver disease Neg Hx    Pancreatic cancer Neg Hx    Rectal cancer Neg Hx    Stomach cancer Neg Hx     Past Surgical History:  Procedure Laterality Date   BASCILIC VEIN TRANSPOSITION Left 04/06/2016   Procedure: Left Arm Single Stage Bascilic Vein Transposition;  Surgeon: Waynetta Sandy, MD;  Location: [facility];  Service: Vascular;  Laterality: Left;   INSERTION OF DIALYSIS CATHETER Right 04/06/2016   Procedure: INSERTION OF Tunneled DIALYSIS CATHETER RIGHT INTERNAL JUGULAR;  Surgeon: Waynetta Sandy, MD;  Location: [facility] OR;  Service: Vascular;  Laterality: Right;   IR GENERIC HISTORICAL  06/26/2016   IR REMOVAL TUN CV CATH W/O FL 06/26/2016 Arne Cleveland, MD [facility]   TOOTH EXTRACTION  2020   Social History   Occupational History   Occupation: [employer]  Tobacco Use   Smoking status: Never   Smokeless tobacco: Never  Vaping Use   Vaping Use: Never used  Substance and Sexual Activity   Alcohol use: No   Drug use: No   Sexual activity: Yes    Birth control/protection: None

## 2020-11-19 DIAGNOSIS — N2581 Secondary hyperparathyroidism of renal origin: Secondary | ICD-10-CM | POA: Diagnosis not present

## 2020-11-19 DIAGNOSIS — N186 End stage renal disease: Secondary | ICD-10-CM | POA: Diagnosis not present

## 2020-11-19 DIAGNOSIS — Z992 Dependence on renal dialysis: Secondary | ICD-10-CM | POA: Diagnosis not present

## 2020-11-22 DIAGNOSIS — N186 End stage renal disease: Secondary | ICD-10-CM | POA: Diagnosis not present

## 2020-11-22 DIAGNOSIS — N2581 Secondary hyperparathyroidism of renal origin: Secondary | ICD-10-CM | POA: Diagnosis not present

## 2020-11-22 DIAGNOSIS — Z992 Dependence on renal dialysis: Secondary | ICD-10-CM | POA: Diagnosis not present

## 2020-11-24 ENCOUNTER — Other Ambulatory Visit: Payer: Self-pay

## 2020-11-24 ENCOUNTER — Encounter: Payer: Self-pay | Admitting: Nurse Practitioner

## 2020-11-24 ENCOUNTER — Ambulatory Visit (INDEPENDENT_AMBULATORY_CARE_PROVIDER_SITE_OTHER): Payer: BC Managed Care – PPO | Admitting: Nurse Practitioner

## 2020-11-24 VITALS — BP 146/82 | HR 64 | Temp 98.7°F | Ht 67.0 in | Wt 199.4 lb

## 2020-11-24 DIAGNOSIS — N186 End stage renal disease: Secondary | ICD-10-CM

## 2020-11-24 DIAGNOSIS — Z0000 Encounter for general adult medical examination without abnormal findings: Secondary | ICD-10-CM

## 2020-11-24 DIAGNOSIS — Z1159 Encounter for screening for other viral diseases: Secondary | ICD-10-CM

## 2020-11-24 DIAGNOSIS — Z23 Encounter for immunization: Secondary | ICD-10-CM | POA: Diagnosis not present

## 2020-11-24 DIAGNOSIS — I120 Hypertensive chronic kidney disease with stage 5 chronic kidney disease or end stage renal disease: Secondary | ICD-10-CM

## 2020-11-24 DIAGNOSIS — N2581 Secondary hyperparathyroidism of renal origin: Secondary | ICD-10-CM | POA: Diagnosis not present

## 2020-11-24 DIAGNOSIS — Z125 Encounter for screening for malignant neoplasm of prostate: Secondary | ICD-10-CM | POA: Diagnosis not present

## 2020-11-24 DIAGNOSIS — Z114 Encounter for screening for human immunodeficiency virus [HIV]: Secondary | ICD-10-CM

## 2020-11-24 DIAGNOSIS — Z992 Dependence on renal dialysis: Secondary | ICD-10-CM | POA: Diagnosis not present

## 2020-11-24 DIAGNOSIS — D229 Melanocytic nevi, unspecified: Secondary | ICD-10-CM

## 2020-11-24 NOTE — Patient Instructions (Addendum)
Mantenimiento de Teacher, English as a foreign language, Male Adoptar un estilo de vida saludable y recibir atencin preventiva son importantes para promover la salud y Musician. Consulte al mdico sobre: El esquema adecuado para hacerse pruebas y exmenes peridicos. Cosas que puede hacer por su cuenta para prevenir enfermedades y [place] sano. Qu debo saber sobre la dieta, el peso y el ejercicio? Consuma una dieta saludable  Consuma una dieta que incluya muchas verduras, frutas, productos lcteos con bajo contenido de [place] y Advertising account planner. No consuma muchos alimentos ricos en grasas slidas, azcares agregados o sodio.  Mantenga un peso saludable El ndice de masa muscular [facility]) es una medida que puede utilizarse para identificar posibles problemas de [place]. Proporciona una estimacin de la grasa corporal basndose en el peso y la altura. Su mdico puede ayudarle Soil scientist Mineral y a Scientist, forensic o Theatre manager un peso saludable. Haga ejercicio con regularidad Haga ejercicio con regularidad. Esta es una de las prcticas ms importantes que puede hacer por su salud. La [employer] de los adultos deben seguir estas pautas: Optometrist, al menos, 150 minutos de actividad fsica por semana. El ejercicio debe aumentar la frecuencia cardaca y Nature conservation officer transpirar (ejercicio de intensidad moderada). Hacer ejercicios de fortalecimiento por lo [employer] por semana. Agregue esto a su plan de ejercicio de intensidad moderada. Pasar menos tiempo sentados. Incluso la actividad fsica ligera puede ser beneficiosa. Controle sus niveles de colesterol y lpidos en la sangre Comience a realizarse anlisis de lpidos y colesterol en la sangre a los20 aos y luego reptalos cada 5 aos. Es posible que Automotive engineer los niveles de colesterol con mayor frecuencia si: Sus niveles de lpidos y colesterol son altos. Es mayor de 23 aos. Presenta un alto riesgo de padecer enfermedades cardacas. Qu debo  saber sobre las pruebas de deteccin del cncer? Muchos tipos de cncer pueden detectarse de manera temprana y, a menudo, pueden prevenirse. Segn su historia clnica y sus antecedentes familiares, es posible que deba realizarse pruebas de deteccin del cncer en diferentes edades. Esto puede incluir pruebas de deteccin de lo siguiente: Surveyor, minerals. Cncer de prstata. Cncer de piel. Cncer de pulmn. Qu debo saber sobre la enfermedad cardaca, la diabetes y la hipertensinarterial? Presin arterial y enfermedad cardaca La hipertensin arterial causa enfermedades cardacas y [place] el riesgo de accidente cerebrovascular. Es ms probable que esto se manifieste en las personas que tienen lecturas de presin arterial alta, tienen ascendencia africana o tienen sobrepeso. Hable con el mdico sobre sus valores de presin arterial deseados. Hgase controlar la presin arterial: Cada 3 a 5 aos si tiene entre 18 y 48 aos. Todos los aos si es mayor de 40 aos. Si tiene entre 16 y 23 aos y es fumador o Insurance account manager, pregntele al mdico si debe realizarse una prueba de deteccin de aneurisma artico abdominal (AAA) por nica vez. Diabetes Realcese exmenes de deteccin de la diabetes con regularidad. Este anlisis revisa el nivel de azcar en la sangre en [place]. Hgase las pruebas de deteccin: Cada tres aos despus de los 74 aos de edad si tiene un peso normal y un bajo riesgo de padecer diabetes. Con ms frecuencia y a partir de [place] edad inferior si tiene sobrepeso o un alto riesgo de padecer diabetes. Qu debo saber sobre la prevencin de infecciones? Hepatitis B Si tiene un riesgo ms alto de contraer hepatitis B, debe someterse a un examen de deteccin de este virus. Hable con el mdico para averiguar si tiene riesgode  contraer la infeccin por hepatitis B. Hepatitis C Se recomienda un anlisis de [place] para: Todos los que nacieron entre 1945 y [phone]. Todas las personas que  tengan un riesgo de haber contrado hepatitis C. Enfermedades de transmisin sexual (ETS) Debe realizarse pruebas de deteccin de ITS todos los aos, incluidas la gonorrea y la clamidia, si: Es sexualmente activo y es menor de 47 aos. Es mayor de 43 aos, y Investment banker, operational informa que corre riesgo de tener este tipo de infecciones. La actividad sexual ha cambiado desde que le hicieron la ltima prueba de deteccin y tiene un riesgo mayor de Best boy clamidia o Radio broadcast assistant. Pregntele al mdico si usted tiene riesgo. Pregntele al mdico si usted tiene un alto riesgo de Museum/gallery curator VIH. El mdico tambin puede recomendarle un medicamento recetado para ayudar a evitar la infeccin por el VIH. Si elige tomar medicamentos para prevenir el VIH, primero debe [employer] de deteccin del VIH. Luego debe hacerse anlisis cada 3 meses mientras est tomando los medicamentos. Siga estas instrucciones en su casa: Estilo de vida No consuma ningn producto que contenga nicotina o tabaco, como cigarrillos, cigarrillos electrnicos y tabaco de Higher education careers adviser. Si necesita ayuda para dejar de fumar, consulte al mdico. No consuma drogas. No comparta agujas. Solicite ayuda a su mdico si necesita apoyo o informacin para abandonar las drogas. Consumo de alcohol No beba alcohol si el mdico se lo prohbe. Si bebe alcohol: Limite la cantidad que consume de 0 a 2 medidas por da. Est atento a la cantidad de alcohol que hay en las bebidas que toma. En los [place], una medida equivale a una botella de cerveza de 12 oz (355 ml), un vaso de vino de 5 oz (148 ml) o un vaso de una bebida alcohlica de alta graduacin de 1 oz (44 ml). Instrucciones generales Realcese los estudios de rutina de la salud, dentales y de Public librarian. [place]. Infrmele a su mdico si: Se siente deprimido con frecuencia. Alguna vez ha sido vctima de [place] o no se siente seguro en su casa. Resumen Adoptar un estilo  de vida saludable y recibir atencin preventiva son importantes para promover la salud y Musician. Siga las instrucciones del mdico acerca de una dieta saludable, el ejercicio y la realizacin de pruebas o exmenes para Engineer, building services. Siga las instrucciones del mdico con respecto al control del colesterol y la presin arterial. Esta informacin no tiene Marine scientist el consejo del mdico. Asegresede hacerle al mdico cualquier pregunta que tenga. Document Revised: 05/08/2018 Document Reviewed: 05/08/2018 Elsevier Patient Education  2022 [place] Tdap (ttanos, difteria y tos [place]): lo que debe saber Tdap (Tetanus, Diphtheria, Pertussis) Vaccine: What You Need to Know 1. Por qu vacunarse? La vacuna Tdap puede prevenir el ttanos, la difteria y la tos [place]. La difteria y la tos Dyann Ruddle se [place] de persona a Nurse, learning disability. El ttanos ingresa al organismo a travs de cortes o heridas. El [place] (T) provoca rigidez dolorosa en los msculos. El ttanos puede causar graves problemas de [place], como no poder abrir la boca, Best boy dificultad para tragar y Ambulance person, o la muerte. La DIFTERIA (D) puede causar dificultad para respirar, insuficiencia cardaca, parlisis o muerte. La TOS FERINA (aP), tambin conocida como "tos convulsa", puede causar tos violenta e incontrolable lo que hace difcil respirar, comer o beber. La tos ferina puede ser muy grave, especialmente en los bebs y en los nios pequeos, y causar neumona, [place],  dao cerebral o la muerte. En adolescentes y adultos, puede causar prdida de [place], prdida del control de la vejiga, [place] y fracturas de Theatre stage manager al toser de Geographical information systems officer intensa. 2. [place] Tdap La vacuna Tdap es solo para nios de 7 aos en adelante, adolescentes yadultos.  Los adolescentes deben recibir una dosis nica de la vacuna Tdap, preferentemente a los 11 o 12aos. Las personas embarazadas deben recibir una dosis de la vacuna  Tdap durante cada embarazo, preferiblemente durante la primera parte del tercer trimestre, para ayudar a proteger al recin nacido de la tos [place]. Los bebs tienen mayor riesgo desufrir complicaciones graves y potencialmente mortales debido a la tos [place]. Los adultos que nunca recibieron la vacuna Tdap deben recibir una dosis. Adems, los adultos deben recibir una dosis de refuerzo de Tdap o Td (una vacuna diferente que protege contra el ttanos y la difteria, pero no contra la tos [place]) cada 10 aos, o despus de 5 aos en caso de herida o [place] grave o sucia. La vacuna Tdap puede ser administrada al mismo tiempo que otras vacunas. 3. Hable con el mdico Comunquese con la persona que le coloca las vacunas si la persona que la recibe: Ha tenido una reaccin alrgica despus de Ardelia Mems dosis anterior de cualquier vacuna contra el ttanos, la difteria o la tos [place], o cualquier Buyer, retail grave, potencialmente mortal Ha tenido un coma, disminucin del nivel de la conciencia o convulsiones prolongadas dentro de los 7 das posteriores a una dosis anterior de cualquier vacuna contra la tos Arts development officer (DTP, DTaP o Tdap) Tiene convulsiones u otro problema del sistema nervioso Alguna vez tuvo sndrome de Curator (tambin llamado "SGB") Ha tenido dolor intenso o hinchazn despus de una dosis anterior de cualquier vacuna contra el ttanos o la difteria En algunos casos, es posible que el mdico decida posponer la aplicacin de lavacuna Tdap para una visita en el futuro. Las personas que sufren trastornos menores, como un resfro, pueden vacunarse. Las [place] [employer] tienen enfermedades moderadas o graves generalmente debenesperar hasta recuperarse para poder recibir la vacuna Tdap.  Su mdico puede darle ms informacin. 4. Riesgos de [place] reaccin a la vacuna Despus de recibir la vacuna Tdap a veces se puede Patent attorney, enrojecimiento o Estate agent donde se aplic la inyeccin, fiebre leve,  dolor de cabeza, sensacin de cansancio y nuseas, vmitos, diarrea o dolor de [place]. Las personas a veces se desmayan despus de procedimientos mdicos, incluida la vacunacin. Informe al mdico si se siente mareado, tiene cambios en la visino zumbidos en los odos.  Al igual que con cualquier [employer], existe una probabilidad muy remota deque una vacuna cause una reaccin alrgica grave, otra lesin grave o la muerte. 5. Qu pasa si se presenta un problema grave? Podra producirse una reaccin alrgica despus de que la persona vacunada abandone la clnica. Si observa signos de Nurse, mental health grave (ronchas, hinchazn de la cara y la garganta, dificultad para respirar, latidos cardacos acelerados, mareos o debilidad), llame al 9-1-1y lleve a la persona al hospital ms cercano. Si se presentan otros signos que le preocupan, comunquese con su mdico.  Las reacciones adversas deben informarse al Sistema de Informe de Eventos Adversos de Clinical biochemist (Vaccine Adverse Event Reporting System, VAERS). Por lo general, el mdico presenta este informe o puede hacerlo usted mismo. Visite el sitio web del VAERS en www.vaers.SamedayNews.es o llame al [phone]. El VAERS es solo para Electrical engineer, y los miembros de su personal no proporcionan asesoramiento mdico. 6.  [place] Compensacin de Daos por [place] de Compensacin de Daos por Clinical biochemist (National Vaccine Injury Compensation Program, VICP) es un programa federal que fue creado para Patent examiner a las personas que puedan haber sufrido daos al recibir ciertas vacunas. Las Artist a presuntas lesiones o muerte debidas a la vacunacin tienen un lmite de tiempo para su presentacin, que puede ser de tan solo [employer]. Visite el sitio web del VICP en GoldCloset.com.ee o llame al 1-800-338-2382para obtener ms informacin acerca del programa y de cmo presentar un reclamo. 7. Cmo puedo  obtener ms informacin? Pregntele a su mdico. Comunquese con el servicio de salud de su localidad o su estado. Visite el sitio web de Research scientist (life sciences), Surveyor, minerals) (Administracin de Alimentos y Chief Strategy Officer) para ver los prospectos de las vacunas e informacin adicional en TraderRating.uy. Comunquese con Garment/textile technologist for [employer], CDC (Centros para el Control y la Prevencin de [place]): Llame al [phone] (1-800-CDC-INFO) o Visite el sitio web de [employer] en http://hunter.com/. Declaracin de informacin de la vacuna Tdap (ttanos, difteria y tos ferina)(12/05/2019) Esta informacin no tiene Marine scientist el consejo del mdico. Asegresede hacerle al mdico cualquier pregunta que tenga. Document Revised: 02/03/2020 Document Reviewed: 01/19/2020 Elsevier Patient Education  2022 [employer].     He is receiving the tdap today in the office please wait at least 2 weeks before giving his Covid booster.

## 2020-11-24 NOTE — Progress Notes (Signed)
I,Katawbba Wiggins,acting as a Education administrator for [employer], FNP.,have documented all relevant documentation on the behalf of [name], FNP,as directed by  [name], FNP while in the presence of [name], [place].   This visit occurred during the SARS-CoV-2 public health emergency.  Safety protocols were in place, including screening questions prior to the visit, additional usage of staff PPE, and extensive cleaning of exam room while observing appropriate contact time as indicated for disinfecting solutions.  Subjective:     Patient ID: Dylan [name] , male    DOB: Feb 29, 1961 , 60 y.o.   MRN: [id number]   Chief Complaint  Patient presents with   Annual Exam     HPI  Here for HM with interpreter [facility].  He goes to dialysis on MWF - reports his blood pressure will drop lower when having dialysis. He is followed by Dr. Marval Regal.      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   Renal disorder    Uremia of renal origin 03/29/2016     Family History  Problem Relation Age of Onset   Breast cancer Mother    Prostate cancer Father    Diabetes Brother    Breast cancer Sister    Colon cancer Neg Hx    Esophageal cancer Neg Hx    Inflammatory bowel disease Neg Hx    Liver disease Neg Hx    Pancreatic cancer Neg Hx    Rectal cancer Neg Hx    Stomach cancer Neg Hx      Current Outpatient Medications:    amLODipine (NORVASC) 5 MG tablet, Take by mouth., Disp: , Rfl:    calcitRIOL (ROCALTROL) 0.25 MCG capsule, Take 1 capsule (0.25 mcg total) by mouth daily., Disp: 1 capsule, Rfl: 0    cinacalcet (SENSIPAR) 60 MG tablet, Take 1 tablet by mouth daily., Disp: , Rfl:    FOSRENOL 500 MG chewable tablet, Chew 500 mg by mouth 5 (five) times daily., Disp: , Rfl:    methocarbamol (ROBAXIN) 500 MG tablet, Take 1 tablet (500 mg total) by mouth 2 (two) times daily., Disp: 20 tablet, Rfl: 0   multivitamin (RENA-VIT) TABS tablet, Take 1 tablet by mouth at bedtime., Disp: 30 tablet, Rfl: 0   sevelamer carbonate (RENVELA) 800 MG tablet, Take by mouth., Disp: , Rfl:    sucroferric oxyhydroxide (VELPHORO) 500 MG chewable tablet, Chew 1 tablet (500 mg total) by mouth 3 (three) times daily with meals., Disp: 1 tablet, Rfl: 0   oxyCODONE-acetaminophen (PERCOCET/ROXICET) 5-325 MG tablet, Take 1 tablet by mouth every 6 (six) hours as needed for severe pain. (Patient not taking: Reported on 11/24/2020), Disp: 15 tablet, Rfl: 0   predniSONE (STERAPRED UNI-PAK 21 TAB) 5 MG (21) TBPK tablet, Take as directed if ok with nephrology (Patient not taking: Reported on 11/24/2020), Disp: 21 tablet, Rfl: 0   No Known Allergies   Men's preventive visit. Patient Health Questionnaire (PHQ-2) is  [place] Office Visit from 11/24/2020 in [facility]  PHQ-2 Total Score 0      Patient is on a Regular diet; does not  follow a renal diet. Reports he exercises by walking approximately 1/2 hour 7 days a week.  Marital status: Married. Relevant history for alcohol use is:  Social History   Substance and Sexual Activity  Alcohol Use No   Relevant history for tobacco use is:  Social History   Tobacco Use  Smoking Status Never  Smokeless Tobacco Never  .   Review of Systems  HENT: Negative.    Eyes: Negative.   Respiratory: Negative.    Cardiovascular: Negative.  Negative for chest pain, palpitations and leg swelling.  Gastrointestinal: Negative.   Endocrine: Negative.   Genitourinary: Negative.   Musculoskeletal: Negative.   Skin:        He has a mole to his face that is growing  and starting to bother him. Also has raised area to his left forearm.  Allergic/Immunologic: Negative.   Neurological: Negative.  Negative for dizziness and headaches.  Hematological: Negative.   Psychiatric/Behavioral: Negative.    All other systems reviewed and are negative.   Today's Vitals   11/24/20 1443  BP: (!) 146/82  Pulse: 64  Temp: 98.7 F (37.1 C)  TempSrc: Oral  Weight: 199 lb 6.4 oz (90.4 kg)  Height: 5\' 7"  (1.702 m)   Body mass index is 31.23 kg/m.  Wt Readings from Last 3 Encounters:  11/24/20 199 lb 6.4 oz (90.4 kg)  11/18/20 203 lb (92.1 kg)  06/15/20 203 lb (92.1 kg)    BP Readings from Last 3 Encounters:  11/24/20 (!) 146/82  06/07/20 (!) 181/99  10/10/19 128/74    Objective:  Physical Exam Vitals reviewed.  Constitutional:      Appearance: Normal appearance. He is obese.  HENT:     Head: Normocephalic and atraumatic.     Right Ear: Tympanic membrane, ear canal and external ear normal. There is no impacted cerumen.     Left Ear: Tympanic membrane, ear canal and external ear normal. There is no impacted cerumen.  Cardiovascular:     Rate and Rhythm: Normal rate and regular rhythm.     Pulses: Normal pulses.     Heart sounds: Normal heart sounds. No murmur heard. Pulmonary:     Effort: Pulmonary effort is normal. No respiratory distress.     Breath sounds: Normal breath sounds.  Abdominal:     General: Abdomen is flat. Bowel sounds are normal. There is no distension.     Palpations: Abdomen is soft.  Genitourinary:    Prostate: Normal.     Rectum: Guaiac result negative.  Musculoskeletal:        General: Normal range of motion.     Cervical back: Normal range of motion and neck supple.  Skin:    General: Skin is warm.     Capillary Refill: Capillary refill takes less than 2 seconds.     Comments: Has brown raised nevus to left cheek and left forearm.  Neurological:     General: No focal deficit present.     Mental Status: He is alert and  oriented to person, place, and time.  Psychiatric:        Mood and Affect: Mood normal.        Behavior: Behavior normal.        Thought Content: Thought content normal.        Judgment: Judgment normal.        Assessment And Plan:    1. Routine medical exam Behavior modifications discussed and diet history reviewed.   Pt will continue  to exercise regularly and modify diet with low GI, plant based foods and decrease intake of processed foods.  Recommend intake of daily multivitamin, Vitamin D, and calcium.  Recommend mammogram and colonoscopy for preventive screenings, as well as recommend immunizations that include influenza, TDAP, and Shingles  2. Encounter for HIV (human immunodeficiency virus) test - HIV Antibody (routine testing w rflx)  3. Encounter for hepatitis C screening test for low risk patient Will check Hepatitis C screening due to recent recommendations to screen all adults 18 years and older - Hepatitis C antibody  4. Encounter for immunization Sent Rx to pharmacy. TDAP will be administered to adults 24-51 years old every 10 years. - Tdap vaccine greater than or equal to 7yo IM  5. Encounter for prostate cancer screening - PSA  6. Benign hypertension with ESRD (end-stage renal disease) ([place]) Comments: Continue Hemodialysis, doing well - POCT Urinalysis Dipstick (81002) - POCT UA - Microalbumin  7. Nevus Comments: Owens Shark raised nevus to left cheek and left forearm, will refer to Dermatology - Ambulatory referral to Dermatology    Patient was given opportunity to ask questions. Patient verbalized understanding of the plan and was able to repeat key elements of the plan. All questions were answered to their satisfaction.   [name], FNP   I, [name], FNP, have reviewed all documentation for this visit. The documentation on 12/12/20 for the exam, diagnosis, procedures, and orders are all accurate and complete.  THE PATIENT IS ENCOURAGED TO PRACTICE  SOCIAL DISTANCING DUE TO THE COVID-19 PANDEMIC.

## 2020-11-25 LAB — HEPATITIS C ANTIBODY: Hep C Virus Ab: 0.1 s/co ratio (ref 0.0–0.9)

## 2020-11-25 LAB — HIV ANTIBODY (ROUTINE TESTING W REFLEX): HIV Screen 4th Generation wRfx: NONREACTIVE

## 2020-11-25 LAB — PSA: Prostate Specific Ag, Serum: 2.8 ng/mL (ref 0.0–4.0)

## 2020-11-26 DIAGNOSIS — N186 End stage renal disease: Secondary | ICD-10-CM | POA: Diagnosis not present

## 2020-11-26 DIAGNOSIS — Z992 Dependence on renal dialysis: Secondary | ICD-10-CM | POA: Diagnosis not present

## 2020-11-26 DIAGNOSIS — N2581 Secondary hyperparathyroidism of renal origin: Secondary | ICD-10-CM | POA: Diagnosis not present

## 2020-11-28 DIAGNOSIS — I158 Other secondary hypertension: Secondary | ICD-10-CM | POA: Diagnosis not present

## 2020-11-28 DIAGNOSIS — N186 End stage renal disease: Secondary | ICD-10-CM | POA: Diagnosis not present

## 2020-11-28 DIAGNOSIS — Z992 Dependence on renal dialysis: Secondary | ICD-10-CM | POA: Diagnosis not present

## 2020-11-29 DIAGNOSIS — Z992 Dependence on renal dialysis: Secondary | ICD-10-CM | POA: Diagnosis not present

## 2020-11-29 DIAGNOSIS — N186 End stage renal disease: Secondary | ICD-10-CM | POA: Diagnosis not present

## 2020-11-29 DIAGNOSIS — N2581 Secondary hyperparathyroidism of renal origin: Secondary | ICD-10-CM | POA: Diagnosis not present

## 2020-12-01 DIAGNOSIS — Z992 Dependence on renal dialysis: Secondary | ICD-10-CM | POA: Diagnosis not present

## 2020-12-01 DIAGNOSIS — N2581 Secondary hyperparathyroidism of renal origin: Secondary | ICD-10-CM | POA: Diagnosis not present

## 2020-12-01 DIAGNOSIS — N186 End stage renal disease: Secondary | ICD-10-CM | POA: Diagnosis not present

## 2020-12-03 DIAGNOSIS — Z992 Dependence on renal dialysis: Secondary | ICD-10-CM | POA: Diagnosis not present

## 2020-12-03 DIAGNOSIS — N2581 Secondary hyperparathyroidism of renal origin: Secondary | ICD-10-CM | POA: Diagnosis not present

## 2020-12-03 DIAGNOSIS — N186 End stage renal disease: Secondary | ICD-10-CM | POA: Diagnosis not present

## 2020-12-06 DIAGNOSIS — N186 End stage renal disease: Secondary | ICD-10-CM | POA: Diagnosis not present

## 2020-12-06 DIAGNOSIS — N2581 Secondary hyperparathyroidism of renal origin: Secondary | ICD-10-CM | POA: Diagnosis not present

## 2020-12-06 DIAGNOSIS — Z992 Dependence on renal dialysis: Secondary | ICD-10-CM | POA: Diagnosis not present

## 2020-12-08 DIAGNOSIS — N2581 Secondary hyperparathyroidism of renal origin: Secondary | ICD-10-CM | POA: Diagnosis not present

## 2020-12-08 DIAGNOSIS — N186 End stage renal disease: Secondary | ICD-10-CM | POA: Diagnosis not present

## 2020-12-08 DIAGNOSIS — Z992 Dependence on renal dialysis: Secondary | ICD-10-CM | POA: Diagnosis not present

## 2020-12-10 DIAGNOSIS — Z992 Dependence on renal dialysis: Secondary | ICD-10-CM | POA: Diagnosis not present

## 2020-12-10 DIAGNOSIS — N2581 Secondary hyperparathyroidism of renal origin: Secondary | ICD-10-CM | POA: Diagnosis not present

## 2020-12-10 DIAGNOSIS — N186 End stage renal disease: Secondary | ICD-10-CM | POA: Diagnosis not present

## 2020-12-13 DIAGNOSIS — N2581 Secondary hyperparathyroidism of renal origin: Secondary | ICD-10-CM | POA: Diagnosis not present

## 2020-12-13 DIAGNOSIS — N186 End stage renal disease: Secondary | ICD-10-CM | POA: Diagnosis not present

## 2020-12-13 DIAGNOSIS — Z992 Dependence on renal dialysis: Secondary | ICD-10-CM | POA: Diagnosis not present

## 2020-12-15 ENCOUNTER — Ambulatory Visit
Admission: RE | Admit: 2020-12-15 | Discharge: 2020-12-15 | Disposition: A | Discharge status: Home / Self Care | Payer: BC Managed Care – PPO | Source: Ambulatory Visit | Attending: Nephrology | Admitting: Nephrology

## 2020-12-15 ENCOUNTER — Other Ambulatory Visit: Payer: Self-pay | Admitting: Nephrology

## 2020-12-15 DIAGNOSIS — A159 Respiratory tuberculosis unspecified: Secondary | ICD-10-CM

## 2020-12-15 DIAGNOSIS — Z992 Dependence on renal dialysis: Secondary | ICD-10-CM | POA: Diagnosis not present

## 2020-12-15 DIAGNOSIS — Z7682 Awaiting organ transplant status: Secondary | ICD-10-CM | POA: Diagnosis not present

## 2020-12-15 DIAGNOSIS — N2581 Secondary hyperparathyroidism of renal origin: Secondary | ICD-10-CM | POA: Diagnosis not present

## 2020-12-15 DIAGNOSIS — Z0389 Encounter for observation for other suspected diseases and conditions ruled out: Secondary | ICD-10-CM | POA: Diagnosis not present

## 2020-12-15 DIAGNOSIS — N186 End stage renal disease: Secondary | ICD-10-CM | POA: Diagnosis not present

## 2020-12-17 DIAGNOSIS — Z992 Dependence on renal dialysis: Secondary | ICD-10-CM | POA: Diagnosis not present

## 2020-12-17 DIAGNOSIS — N2581 Secondary hyperparathyroidism of renal origin: Secondary | ICD-10-CM | POA: Diagnosis not present

## 2020-12-17 DIAGNOSIS — N186 End stage renal disease: Secondary | ICD-10-CM | POA: Diagnosis not present

## 2020-12-20 DIAGNOSIS — D689 Coagulation defect, unspecified: Secondary | ICD-10-CM | POA: Diagnosis not present

## 2020-12-20 DIAGNOSIS — N186 End stage renal disease: Secondary | ICD-10-CM | POA: Diagnosis not present

## 2020-12-20 DIAGNOSIS — N2581 Secondary hyperparathyroidism of renal origin: Secondary | ICD-10-CM | POA: Diagnosis not present

## 2020-12-20 DIAGNOSIS — Z992 Dependence on renal dialysis: Secondary | ICD-10-CM | POA: Diagnosis not present

## 2020-12-21 DIAGNOSIS — N186 End stage renal disease: Secondary | ICD-10-CM | POA: Diagnosis not present

## 2020-12-22 DIAGNOSIS — Z992 Dependence on renal dialysis: Secondary | ICD-10-CM | POA: Diagnosis not present

## 2020-12-22 DIAGNOSIS — N2581 Secondary hyperparathyroidism of renal origin: Secondary | ICD-10-CM | POA: Diagnosis not present

## 2020-12-22 DIAGNOSIS — N186 End stage renal disease: Secondary | ICD-10-CM | POA: Diagnosis not present

## 2020-12-22 DIAGNOSIS — D689 Coagulation defect, unspecified: Secondary | ICD-10-CM | POA: Diagnosis not present

## 2020-12-23 DIAGNOSIS — N186 End stage renal disease: Secondary | ICD-10-CM | POA: Diagnosis not present

## 2020-12-24 DIAGNOSIS — N186 End stage renal disease: Secondary | ICD-10-CM | POA: Diagnosis not present

## 2020-12-24 DIAGNOSIS — N2581 Secondary hyperparathyroidism of renal origin: Secondary | ICD-10-CM | POA: Diagnosis not present

## 2020-12-24 DIAGNOSIS — Z992 Dependence on renal dialysis: Secondary | ICD-10-CM | POA: Diagnosis not present

## 2020-12-24 DIAGNOSIS — D689 Coagulation defect, unspecified: Secondary | ICD-10-CM | POA: Diagnosis not present

## 2020-12-27 DIAGNOSIS — N186 End stage renal disease: Secondary | ICD-10-CM | POA: Diagnosis not present

## 2020-12-27 DIAGNOSIS — Z992 Dependence on renal dialysis: Secondary | ICD-10-CM | POA: Diagnosis not present

## 2020-12-27 DIAGNOSIS — N2581 Secondary hyperparathyroidism of renal origin: Secondary | ICD-10-CM | POA: Diagnosis not present

## 2020-12-28 ENCOUNTER — Ambulatory Visit (INDEPENDENT_AMBULATORY_CARE_PROVIDER_SITE_OTHER): Payer: BC Managed Care – PPO | Admitting: Sports Medicine

## 2020-12-28 ENCOUNTER — Other Ambulatory Visit: Payer: Self-pay

## 2020-12-28 VITALS — Ht 66.54 in | Wt 194.0 lb

## 2020-12-28 DIAGNOSIS — M7521 Bicipital tendinitis, right shoulder: Secondary | ICD-10-CM | POA: Diagnosis not present

## 2020-12-28 NOTE — Patient Instructions (Signed)
Tiene inflamacin del tendn del bceps. Recomendamos aplicar gel de voltaren o crema de capsaicina dos veces al da. Lo derivaremos a fisioterapia para fortalecer los msculos de su brazo y hombro. Le haremos un seguimiento en 1 mes para asegurarnos de que su dolor est mejorando.
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
## 2020-12-29 DIAGNOSIS — N2581 Secondary hyperparathyroidism of renal origin: Secondary | ICD-10-CM | POA: Diagnosis not present

## 2020-12-29 DIAGNOSIS — Z992 Dependence on renal dialysis: Secondary | ICD-10-CM | POA: Diagnosis not present

## 2020-12-29 DIAGNOSIS — N186 End stage renal disease: Secondary | ICD-10-CM | POA: Diagnosis not present

## 2020-12-29 DIAGNOSIS — I158 Other secondary hypertension: Secondary | ICD-10-CM | POA: Diagnosis not present

## 2020-12-31 DIAGNOSIS — N2581 Secondary hyperparathyroidism of renal origin: Secondary | ICD-10-CM | POA: Diagnosis not present

## 2020-12-31 DIAGNOSIS — N186 End stage renal disease: Secondary | ICD-10-CM | POA: Diagnosis not present

## 2020-12-31 DIAGNOSIS — Z992 Dependence on renal dialysis: Secondary | ICD-10-CM | POA: Diagnosis not present

## 2021-01-03 DIAGNOSIS — Z992 Dependence on renal dialysis: Secondary | ICD-10-CM | POA: Diagnosis not present

## 2021-01-03 DIAGNOSIS — N2581 Secondary hyperparathyroidism of renal origin: Secondary | ICD-10-CM | POA: Diagnosis not present

## 2021-01-03 DIAGNOSIS — N186 End stage renal disease: Secondary | ICD-10-CM | POA: Diagnosis not present

## 2021-01-05 DIAGNOSIS — Z992 Dependence on renal dialysis: Secondary | ICD-10-CM | POA: Diagnosis not present

## 2021-01-05 DIAGNOSIS — N2581 Secondary hyperparathyroidism of renal origin: Secondary | ICD-10-CM | POA: Diagnosis not present

## 2021-01-05 DIAGNOSIS — N186 End stage renal disease: Secondary | ICD-10-CM | POA: Diagnosis not present

## 2021-01-07 DIAGNOSIS — N186 End stage renal disease: Secondary | ICD-10-CM | POA: Diagnosis not present

## 2021-01-07 DIAGNOSIS — Z992 Dependence on renal dialysis: Secondary | ICD-10-CM | POA: Diagnosis not present

## 2021-01-07 DIAGNOSIS — N2581 Secondary hyperparathyroidism of renal origin: Secondary | ICD-10-CM | POA: Diagnosis not present

## 2021-01-10 DIAGNOSIS — N2581 Secondary hyperparathyroidism of renal origin: Secondary | ICD-10-CM | POA: Diagnosis not present

## 2021-01-10 DIAGNOSIS — Z992 Dependence on renal dialysis: Secondary | ICD-10-CM | POA: Diagnosis not present

## 2021-01-10 DIAGNOSIS — N186 End stage renal disease: Secondary | ICD-10-CM | POA: Diagnosis not present

## 2021-01-12 DIAGNOSIS — N2581 Secondary hyperparathyroidism of renal origin: Secondary | ICD-10-CM | POA: Diagnosis not present

## 2021-01-12 DIAGNOSIS — N186 End stage renal disease: Secondary | ICD-10-CM | POA: Diagnosis not present

## 2021-01-12 DIAGNOSIS — Z992 Dependence on renal dialysis: Secondary | ICD-10-CM | POA: Diagnosis not present

## 2021-01-14 DIAGNOSIS — N2581 Secondary hyperparathyroidism of renal origin: Secondary | ICD-10-CM | POA: Diagnosis not present

## 2021-01-14 DIAGNOSIS — N186 End stage renal disease: Secondary | ICD-10-CM | POA: Diagnosis not present

## 2021-01-14 DIAGNOSIS — Z992 Dependence on renal dialysis: Secondary | ICD-10-CM | POA: Diagnosis not present

## 2021-01-17 DIAGNOSIS — N186 End stage renal disease: Secondary | ICD-10-CM | POA: Diagnosis not present

## 2021-01-17 DIAGNOSIS — L918 Other hypertrophic disorders of the skin: Secondary | ICD-10-CM | POA: Diagnosis not present

## 2021-01-17 DIAGNOSIS — L728 Other follicular cysts of the skin and subcutaneous tissue: Secondary | ICD-10-CM | POA: Diagnosis not present

## 2021-01-17 DIAGNOSIS — N2581 Secondary hyperparathyroidism of renal origin: Secondary | ICD-10-CM | POA: Diagnosis not present

## 2021-01-17 DIAGNOSIS — R208 Other disturbances of skin sensation: Secondary | ICD-10-CM | POA: Diagnosis not present

## 2021-01-17 DIAGNOSIS — Z992 Dependence on renal dialysis: Secondary | ICD-10-CM | POA: Diagnosis not present

## 2021-01-18 ENCOUNTER — Encounter (HOSPITAL_COMMUNITY): Payer: Self-pay

## 2021-01-18 ENCOUNTER — Ambulatory Visit (HOSPITAL_COMMUNITY): Payer: BC Managed Care – PPO | Attending: Sports Medicine

## 2021-01-18 ENCOUNTER — Other Ambulatory Visit: Payer: Self-pay

## 2021-01-18 DIAGNOSIS — N2581 Secondary hyperparathyroidism of renal origin: Secondary | ICD-10-CM | POA: Diagnosis not present

## 2021-01-18 DIAGNOSIS — Z992 Dependence on renal dialysis: Secondary | ICD-10-CM | POA: Diagnosis not present

## 2021-01-18 DIAGNOSIS — M25511 Pain in right shoulder: Secondary | ICD-10-CM | POA: Insufficient documentation

## 2021-01-18 DIAGNOSIS — N186 End stage renal disease: Secondary | ICD-10-CM | POA: Diagnosis not present

## 2021-01-19 DIAGNOSIS — N2581 Secondary hyperparathyroidism of renal origin: Secondary | ICD-10-CM | POA: Diagnosis not present

## 2021-01-19 DIAGNOSIS — N186 End stage renal disease: Secondary | ICD-10-CM | POA: Diagnosis not present

## 2021-01-19 DIAGNOSIS — Z992 Dependence on renal dialysis: Secondary | ICD-10-CM | POA: Diagnosis not present

## 2021-01-19 NOTE — Therapy (Signed)
[facility] [address] [place], [place], [address] Phone: [phone]   Fax:  [phone]  Physical Therapy Evaluation  Patient Details  Name: Dylan [name] MRN: [id number] Date of Birth: June 14, 1960 Referring Provider (PT): Thurman Coyer, DO   Encounter Date: 01/18/2021   PT End of Session - 01/18/21 1617     Visit Number 1    Number of Visits 1    Authorization Type BCBS    PT Start Time 1618   late arrival   PT Stop Time 1645    PT Time Calculation (min) 27 min    Activity Tolerance Patient tolerated treatment well    Behavior During Therapy [facility] for tasks assessed/performed             Past Medical History:  Diagnosis Date   Anemia    Bilateral chronic knee pain 07/04/2018   Chronic pain of left ankle 07/04/2018   ESRD (end stage renal disease) ([facility]) 03/29/2016   Glucosuria 07/04/2018   History of anemia due to CKD 03/29/2016   Hyperkalemia, diminished renal excretion 03/29/2016   Hypertension    Hypertensive nephropathy 4/40/3474   Metabolic acidosis, NAG, 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   Renal disorder    Uremia of renal origin 03/29/2016    Past Surgical History:  Procedure Laterality Date   BASCILIC VEIN TRANSPOSITION Left 04/06/2016   Procedure: Left Arm Single Stage Bascilic Vein Transposition;  Surgeon: Waynetta Sandy, MD;  Location: [facility];  Service: Vascular;  Laterality: Left;   INSERTION OF DIALYSIS CATHETER Right 04/06/2016   Procedure: INSERTION OF Tunneled DIALYSIS CATHETER RIGHT INTERNAL JUGULAR;  Surgeon: Waynetta Sandy, MD;  Location: [facility];  Service: Vascular;  Laterality: Right;   IR GENERIC HISTORICAL  06/26/2016   IR REMOVAL TUN CV CATH W/O FL 06/26/2016 Arne Cleveland, MD [facility]-INTERV RAD   TOOTH EXTRACTION  2020    There were no vitals filed for this visit.    Subjective Assessment - 01/18/21 1620     Subjective Right shoulder pain of insidious onset and notes  no pain when he is working and performing activities and notes onset of pain at night, especially when right sidelying. Pt has dialysis 3x/wk and works at [employer] on [address].    Currently in Pain? Yes    Pain Score 5    with use   Pain Location Shoulder    Pain Orientation Right    Pain Descriptors / Indicators Aching;Sore    Pain Type Acute pain    Pain Onset 1 to 4 weeks ago    Pain Frequency Intermittent    Aggravating Factors  use of RUE, overhead movement                            Objective measurements completed on examination: See above findings.       [place] Adult PT Treatment/Exercise - 01/19/21 0001       Exercises   Exercises Shoulder      Shoulder Exercises: Seated   Other Seated Exercises instructed in postural re-education activities to reduce rounded shoulders.  INstructed in heavy eccentrics for riht bicep 3x10. Bicep stretch 3x30 sec.                     PT Education - 01/19/21 1243     Education Details educated on assessment findings, nature  of innjury and benefit of activity modification and work duty assignment to decrease repetitive motion. Education extensive in performance of eccentrics, stretching, and its rationale and time line for obervable change in symptoms/arm function    Person(s) Educated Patient;Other (comment)   interpreter   Methods Explanation;Demonstration;Handout    Comprehension Verbalized understanding;Returned demonstration              PT Short Term Goals - 01/19/21 1249       PT SHORT TERM GOAL #1   Title Demo independence with HEP for right shoulder/bicep tendinopathy    Baseline Return demo with visual and handout    Time 1    Period Days    Status Achieved    Target Date 01/18/21                       Plan - 01/18/21 1740     Clinical Impression Statement Patient demonstrates right shoulder pain with his work  duties that require repetitive motion and demo painful arc of right shoulder AROM limiting activity tolerance, participation, and discomfort when sleeping due to pain/dysfunction.  Pt requests assessment and one time intervention at this time to establish HEP for self-directed care    Personal Factors and Comorbidities Comorbidity 1;Time since onset of injury/illness/exacerbation    Comorbidities CKD    Examination-Activity Limitations Lift;Carry;Sleep;Reach Overhead    Examination-Participation Restrictions Yard Work;Occupation;Cleaning    Stability/Clinical Decision Making Stable/Uncomplicated    Clinical Decision Making Low    Rehab Potential Good    PT Frequency One time visit    PT Treatment/Interventions Therapeutic activities;Therapeutic exercise;Neuromuscular re-education;Manual techniques;Patient/family education    PT Home Exercise Plan bicep eccentric, stretch, posture    Consulted and Agree with Plan of Care Patient             Patient will benefit from skilled therapeutic intervention in order to improve the following deficits and impairments:  Decreased strength, Decreased range of motion, Postural dysfunction, Pain  Visit Diagnosis: Acute pain of right shoulder     Problem List Patient Active Problem List   Diagnosis Date Noted   Seizure ([place]) 09/18/2019   Encounter for screening colonoscopy 04/17/2019   Screening for viral disease 04/17/2019   ESRD on hemodialysis ([place]) 03/15/2019   Primary osteoarthritis of right knee 01/14/2019   Primary osteoarthritis of left knee 01/14/2019   Effusion, right knee 01/14/2019   LVH (left ventricular hypertrophy) 12/27/2018   Aftercare including intermittent dialysis ([place]) 08/28/2018   Acidosis 08/28/2018   Anemia in chronic kidney disease 08/28/2018   Arteriovenous fistula, acquired ([place]) 08/28/2018   Chronic pain of left ankle 07/04/2018   Bilateral chronic knee pain 07/04/2018   Glucosuria 07/04/2018   Hypertensive  nephropathy 06/11/2018   Hyperkalemia, diminished renal excretion 03/29/2016   ESRD (end stage renal disease) ([place]) 03/29/2016   Uremia of renal origin 03/29/2016   HTN (hypertension) 35/32/9924   Metabolic acidosis, NAG, 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   History of anemia due to CKD 03/29/2016    Toniann Fail, PT 01/19/2021, 12:50 PM  [facility] [address] [place], [place], [address] Phone: [phone]   Fax:  [phone]  Name: Dylan [name] MRN: [id number] Date of Birth: 10/27/1960

## 2021-01-21 DIAGNOSIS — Z992 Dependence on renal dialysis: Secondary | ICD-10-CM | POA: Diagnosis not present

## 2021-01-21 DIAGNOSIS — N2581 Secondary hyperparathyroidism of renal origin: Secondary | ICD-10-CM | POA: Diagnosis not present

## 2021-01-21 DIAGNOSIS — N186 End stage renal disease: Secondary | ICD-10-CM | POA: Diagnosis not present

## 2021-01-24 DIAGNOSIS — N2581 Secondary hyperparathyroidism of renal origin: Secondary | ICD-10-CM | POA: Diagnosis not present

## 2021-01-24 DIAGNOSIS — N186 End stage renal disease: Secondary | ICD-10-CM | POA: Diagnosis not present

## 2021-01-24 DIAGNOSIS — Z992 Dependence on renal dialysis: Secondary | ICD-10-CM | POA: Diagnosis not present

## 2021-01-25 ENCOUNTER — Ambulatory Visit: Payer: BC Managed Care – PPO | Admitting: Sports Medicine

## 2021-01-26 DIAGNOSIS — N186 End stage renal disease: Secondary | ICD-10-CM | POA: Diagnosis not present

## 2021-01-26 DIAGNOSIS — N2581 Secondary hyperparathyroidism of renal origin: Secondary | ICD-10-CM | POA: Diagnosis not present

## 2021-01-26 DIAGNOSIS — Z992 Dependence on renal dialysis: Secondary | ICD-10-CM | POA: Diagnosis not present

## 2021-01-28 DIAGNOSIS — N186 End stage renal disease: Secondary | ICD-10-CM | POA: Diagnosis not present

## 2021-01-28 DIAGNOSIS — Z992 Dependence on renal dialysis: Secondary | ICD-10-CM | POA: Diagnosis not present

## 2021-01-28 DIAGNOSIS — I158 Other secondary hypertension: Secondary | ICD-10-CM | POA: Diagnosis not present

## 2021-01-28 DIAGNOSIS — N2581 Secondary hyperparathyroidism of renal origin: Secondary | ICD-10-CM | POA: Diagnosis not present

## 2021-01-31 DIAGNOSIS — Z992 Dependence on renal dialysis: Secondary | ICD-10-CM | POA: Diagnosis not present

## 2021-01-31 DIAGNOSIS — N186 End stage renal disease: Secondary | ICD-10-CM | POA: Diagnosis not present

## 2021-01-31 DIAGNOSIS — N2581 Secondary hyperparathyroidism of renal origin: Secondary | ICD-10-CM | POA: Diagnosis not present

## 2021-02-02 DIAGNOSIS — N186 End stage renal disease: Secondary | ICD-10-CM | POA: Diagnosis not present

## 2021-02-02 DIAGNOSIS — Z992 Dependence on renal dialysis: Secondary | ICD-10-CM | POA: Diagnosis not present

## 2021-02-02 DIAGNOSIS — N2581 Secondary hyperparathyroidism of renal origin: Secondary | ICD-10-CM | POA: Diagnosis not present

## 2021-02-04 DIAGNOSIS — N186 End stage renal disease: Secondary | ICD-10-CM | POA: Diagnosis not present

## 2021-02-04 DIAGNOSIS — N2581 Secondary hyperparathyroidism of renal origin: Secondary | ICD-10-CM | POA: Diagnosis not present

## 2021-02-04 DIAGNOSIS — Z992 Dependence on renal dialysis: Secondary | ICD-10-CM | POA: Diagnosis not present

## 2021-02-07 DIAGNOSIS — N186 End stage renal disease: Secondary | ICD-10-CM | POA: Diagnosis not present

## 2021-02-07 DIAGNOSIS — N2581 Secondary hyperparathyroidism of renal origin: Secondary | ICD-10-CM | POA: Diagnosis not present

## 2021-02-07 DIAGNOSIS — Z23 Encounter for immunization: Secondary | ICD-10-CM | POA: Diagnosis not present

## 2021-02-07 DIAGNOSIS — Z992 Dependence on renal dialysis: Secondary | ICD-10-CM | POA: Diagnosis not present

## 2021-02-09 DIAGNOSIS — Z23 Encounter for immunization: Secondary | ICD-10-CM | POA: Diagnosis not present

## 2021-02-09 DIAGNOSIS — Z992 Dependence on renal dialysis: Secondary | ICD-10-CM | POA: Diagnosis not present

## 2021-02-09 DIAGNOSIS — N186 End stage renal disease: Secondary | ICD-10-CM | POA: Diagnosis not present

## 2021-02-09 DIAGNOSIS — N2581 Secondary hyperparathyroidism of renal origin: Secondary | ICD-10-CM | POA: Diagnosis not present

## 2021-02-11 DIAGNOSIS — N186 End stage renal disease: Secondary | ICD-10-CM | POA: Diagnosis not present

## 2021-02-11 DIAGNOSIS — Z992 Dependence on renal dialysis: Secondary | ICD-10-CM | POA: Diagnosis not present

## 2021-02-11 DIAGNOSIS — N2581 Secondary hyperparathyroidism of renal origin: Secondary | ICD-10-CM | POA: Diagnosis not present

## 2021-02-11 DIAGNOSIS — Z23 Encounter for immunization: Secondary | ICD-10-CM | POA: Diagnosis not present

## 2021-02-14 DIAGNOSIS — Z992 Dependence on renal dialysis: Secondary | ICD-10-CM | POA: Diagnosis not present

## 2021-02-14 DIAGNOSIS — N186 End stage renal disease: Secondary | ICD-10-CM | POA: Diagnosis not present

## 2021-02-14 DIAGNOSIS — N2581 Secondary hyperparathyroidism of renal origin: Secondary | ICD-10-CM | POA: Diagnosis not present

## 2021-02-16 DIAGNOSIS — N2581 Secondary hyperparathyroidism of renal origin: Secondary | ICD-10-CM | POA: Diagnosis not present

## 2021-02-16 DIAGNOSIS — Z992 Dependence on renal dialysis: Secondary | ICD-10-CM | POA: Diagnosis not present

## 2021-02-16 DIAGNOSIS — N186 End stage renal disease: Secondary | ICD-10-CM | POA: Diagnosis not present

## 2021-02-18 DIAGNOSIS — Z992 Dependence on renal dialysis: Secondary | ICD-10-CM | POA: Diagnosis not present

## 2021-02-18 DIAGNOSIS — N2581 Secondary hyperparathyroidism of renal origin: Secondary | ICD-10-CM | POA: Diagnosis not present

## 2021-02-18 DIAGNOSIS — N186 End stage renal disease: Secondary | ICD-10-CM | POA: Diagnosis not present

## 2021-02-21 DIAGNOSIS — N2581 Secondary hyperparathyroidism of renal origin: Secondary | ICD-10-CM | POA: Diagnosis not present

## 2021-02-21 DIAGNOSIS — N186 End stage renal disease: Secondary | ICD-10-CM | POA: Diagnosis not present

## 2021-02-21 DIAGNOSIS — Z992 Dependence on renal dialysis: Secondary | ICD-10-CM | POA: Diagnosis not present

## 2021-02-23 DIAGNOSIS — N186 End stage renal disease: Secondary | ICD-10-CM | POA: Diagnosis not present

## 2021-02-23 DIAGNOSIS — N2581 Secondary hyperparathyroidism of renal origin: Secondary | ICD-10-CM | POA: Diagnosis not present

## 2021-02-23 DIAGNOSIS — Z992 Dependence on renal dialysis: Secondary | ICD-10-CM | POA: Diagnosis not present

## 2021-02-25 DIAGNOSIS — N186 End stage renal disease: Secondary | ICD-10-CM | POA: Diagnosis not present

## 2021-02-25 DIAGNOSIS — N2581 Secondary hyperparathyroidism of renal origin: Secondary | ICD-10-CM | POA: Diagnosis not present

## 2021-02-25 DIAGNOSIS — Z992 Dependence on renal dialysis: Secondary | ICD-10-CM | POA: Diagnosis not present

## 2021-02-28 DIAGNOSIS — I158 Other secondary hypertension: Secondary | ICD-10-CM | POA: Diagnosis not present

## 2021-02-28 DIAGNOSIS — Z992 Dependence on renal dialysis: Secondary | ICD-10-CM | POA: Diagnosis not present

## 2021-02-28 DIAGNOSIS — N2581 Secondary hyperparathyroidism of renal origin: Secondary | ICD-10-CM | POA: Diagnosis not present

## 2021-02-28 DIAGNOSIS — N186 End stage renal disease: Secondary | ICD-10-CM | POA: Diagnosis not present

## 2021-03-02 DIAGNOSIS — N186 End stage renal disease: Secondary | ICD-10-CM | POA: Diagnosis not present

## 2021-03-02 DIAGNOSIS — Z992 Dependence on renal dialysis: Secondary | ICD-10-CM | POA: Diagnosis not present

## 2021-03-02 DIAGNOSIS — N2581 Secondary hyperparathyroidism of renal origin: Secondary | ICD-10-CM | POA: Diagnosis not present

## 2021-03-04 DIAGNOSIS — Z992 Dependence on renal dialysis: Secondary | ICD-10-CM | POA: Diagnosis not present

## 2021-03-04 DIAGNOSIS — N186 End stage renal disease: Secondary | ICD-10-CM | POA: Diagnosis not present

## 2021-03-04 DIAGNOSIS — N2581 Secondary hyperparathyroidism of renal origin: Secondary | ICD-10-CM | POA: Diagnosis not present

## 2021-03-06 DIAGNOSIS — Z992 Dependence on renal dialysis: Secondary | ICD-10-CM | POA: Diagnosis not present

## 2021-03-06 DIAGNOSIS — N2581 Secondary hyperparathyroidism of renal origin: Secondary | ICD-10-CM | POA: Diagnosis not present

## 2021-03-06 DIAGNOSIS — N186 End stage renal disease: Secondary | ICD-10-CM | POA: Diagnosis not present

## 2021-03-07 DIAGNOSIS — Z992 Dependence on renal dialysis: Secondary | ICD-10-CM | POA: Diagnosis not present

## 2021-03-07 DIAGNOSIS — N2581 Secondary hyperparathyroidism of renal origin: Secondary | ICD-10-CM | POA: Diagnosis not present

## 2021-03-07 DIAGNOSIS — N186 End stage renal disease: Secondary | ICD-10-CM | POA: Diagnosis not present

## 2021-03-09 DIAGNOSIS — Z7682 Awaiting organ transplant status: Secondary | ICD-10-CM | POA: Diagnosis not present

## 2021-03-09 DIAGNOSIS — N2581 Secondary hyperparathyroidism of renal origin: Secondary | ICD-10-CM | POA: Diagnosis not present

## 2021-03-09 DIAGNOSIS — Z992 Dependence on renal dialysis: Secondary | ICD-10-CM | POA: Diagnosis not present

## 2021-03-09 DIAGNOSIS — N186 End stage renal disease: Secondary | ICD-10-CM | POA: Diagnosis not present

## 2021-03-11 DIAGNOSIS — N2581 Secondary hyperparathyroidism of renal origin: Secondary | ICD-10-CM | POA: Diagnosis not present

## 2021-03-11 DIAGNOSIS — Z992 Dependence on renal dialysis: Secondary | ICD-10-CM | POA: Diagnosis not present

## 2021-03-11 DIAGNOSIS — N186 End stage renal disease: Secondary | ICD-10-CM | POA: Diagnosis not present

## 2021-03-14 DIAGNOSIS — N2581 Secondary hyperparathyroidism of renal origin: Secondary | ICD-10-CM | POA: Diagnosis not present

## 2021-03-14 DIAGNOSIS — Z992 Dependence on renal dialysis: Secondary | ICD-10-CM | POA: Diagnosis not present

## 2021-03-14 DIAGNOSIS — N186 End stage renal disease: Secondary | ICD-10-CM | POA: Diagnosis not present

## 2021-03-16 DIAGNOSIS — Z992 Dependence on renal dialysis: Secondary | ICD-10-CM | POA: Diagnosis not present

## 2021-03-16 DIAGNOSIS — N186 End stage renal disease: Secondary | ICD-10-CM | POA: Diagnosis not present

## 2021-03-16 DIAGNOSIS — N2581 Secondary hyperparathyroidism of renal origin: Secondary | ICD-10-CM | POA: Diagnosis not present

## 2021-03-18 DIAGNOSIS — N186 End stage renal disease: Secondary | ICD-10-CM | POA: Diagnosis not present

## 2021-03-18 DIAGNOSIS — Z992 Dependence on renal dialysis: Secondary | ICD-10-CM | POA: Diagnosis not present

## 2021-03-18 DIAGNOSIS — N2581 Secondary hyperparathyroidism of renal origin: Secondary | ICD-10-CM | POA: Diagnosis not present

## 2021-03-21 DIAGNOSIS — N2581 Secondary hyperparathyroidism of renal origin: Secondary | ICD-10-CM | POA: Diagnosis not present

## 2021-03-21 DIAGNOSIS — Z992 Dependence on renal dialysis: Secondary | ICD-10-CM | POA: Diagnosis not present

## 2021-03-21 DIAGNOSIS — N186 End stage renal disease: Secondary | ICD-10-CM | POA: Diagnosis not present

## 2021-03-23 DIAGNOSIS — N2581 Secondary hyperparathyroidism of renal origin: Secondary | ICD-10-CM | POA: Diagnosis not present

## 2021-03-23 DIAGNOSIS — Z992 Dependence on renal dialysis: Secondary | ICD-10-CM | POA: Diagnosis not present

## 2021-03-23 DIAGNOSIS — N186 End stage renal disease: Secondary | ICD-10-CM | POA: Diagnosis not present

## 2021-03-26 DIAGNOSIS — N186 End stage renal disease: Secondary | ICD-10-CM | POA: Diagnosis not present

## 2021-03-26 DIAGNOSIS — Z992 Dependence on renal dialysis: Secondary | ICD-10-CM | POA: Diagnosis not present

## 2021-03-26 DIAGNOSIS — N2581 Secondary hyperparathyroidism of renal origin: Secondary | ICD-10-CM | POA: Diagnosis not present

## 2021-03-28 DIAGNOSIS — N2581 Secondary hyperparathyroidism of renal origin: Secondary | ICD-10-CM | POA: Diagnosis not present

## 2021-03-28 DIAGNOSIS — N186 End stage renal disease: Secondary | ICD-10-CM | POA: Diagnosis not present

## 2021-03-28 DIAGNOSIS — Z992 Dependence on renal dialysis: Secondary | ICD-10-CM | POA: Diagnosis not present

## 2021-03-30 DIAGNOSIS — N186 End stage renal disease: Secondary | ICD-10-CM | POA: Diagnosis not present

## 2021-03-30 DIAGNOSIS — I158 Other secondary hypertension: Secondary | ICD-10-CM | POA: Diagnosis not present

## 2021-03-30 DIAGNOSIS — N2581 Secondary hyperparathyroidism of renal origin: Secondary | ICD-10-CM | POA: Diagnosis not present

## 2021-03-30 DIAGNOSIS — Z992 Dependence on renal dialysis: Secondary | ICD-10-CM | POA: Diagnosis not present

## 2021-04-01 DIAGNOSIS — N186 End stage renal disease: Secondary | ICD-10-CM | POA: Diagnosis not present

## 2021-04-01 DIAGNOSIS — N2581 Secondary hyperparathyroidism of renal origin: Secondary | ICD-10-CM | POA: Diagnosis not present

## 2021-04-01 DIAGNOSIS — Z992 Dependence on renal dialysis: Secondary | ICD-10-CM | POA: Diagnosis not present

## 2021-04-03 DIAGNOSIS — E875 Hyperkalemia: Secondary | ICD-10-CM | POA: Diagnosis not present

## 2021-04-03 DIAGNOSIS — Y830 Surgical operation with transplant of whole organ as the cause of abnormal reaction of the patient, or of later complication, without mention of misadventure at the time of the procedure: Secondary | ICD-10-CM | POA: Diagnosis not present

## 2021-04-03 DIAGNOSIS — I10 Essential (primary) hypertension: Secondary | ICD-10-CM | POA: Diagnosis not present

## 2021-04-03 DIAGNOSIS — R600 Localized edema: Secondary | ICD-10-CM | POA: Diagnosis not present

## 2021-04-03 DIAGNOSIS — I129 Hypertensive chronic kidney disease with stage 1 through stage 4 chronic kidney disease, or unspecified chronic kidney disease: Secondary | ICD-10-CM | POA: Diagnosis not present

## 2021-04-03 DIAGNOSIS — G8918 Other acute postprocedural pain: Secondary | ICD-10-CM | POA: Diagnosis not present

## 2021-04-03 DIAGNOSIS — Z940 Kidney transplant status: Secondary | ICD-10-CM | POA: Diagnosis not present

## 2021-04-03 DIAGNOSIS — N186 End stage renal disease: Secondary | ICD-10-CM | POA: Diagnosis not present

## 2021-04-03 DIAGNOSIS — Z79899 Other long term (current) drug therapy: Secondary | ICD-10-CM | POA: Diagnosis not present

## 2021-04-03 DIAGNOSIS — Z79621 Long term (current) use of calcineurin inhibitor: Secondary | ICD-10-CM | POA: Diagnosis not present

## 2021-04-03 DIAGNOSIS — Z992 Dependence on renal dialysis: Secondary | ICD-10-CM | POA: Diagnosis not present

## 2021-04-03 DIAGNOSIS — R739 Hyperglycemia, unspecified: Secondary | ICD-10-CM | POA: Diagnosis not present

## 2021-04-03 DIAGNOSIS — Z792 Long term (current) use of antibiotics: Secondary | ICD-10-CM | POA: Diagnosis not present

## 2021-04-03 DIAGNOSIS — Z20822 Contact with and (suspected) exposure to covid-19: Secondary | ICD-10-CM | POA: Diagnosis not present

## 2021-04-03 DIAGNOSIS — B259 Cytomegaloviral disease, unspecified: Secondary | ICD-10-CM | POA: Diagnosis not present

## 2021-04-03 DIAGNOSIS — Z4822 Encounter for aftercare following kidney transplant: Secondary | ICD-10-CM | POA: Diagnosis not present

## 2021-04-03 DIAGNOSIS — E099 Drug or chemical induced diabetes mellitus without complications: Secondary | ICD-10-CM | POA: Diagnosis not present

## 2021-04-03 DIAGNOSIS — T8619 Other complication of kidney transplant: Secondary | ICD-10-CM | POA: Diagnosis not present

## 2021-04-03 DIAGNOSIS — D849 Immunodeficiency, unspecified: Secondary | ICD-10-CM | POA: Diagnosis not present

## 2021-04-03 DIAGNOSIS — B349 Viral infection, unspecified: Secondary | ICD-10-CM | POA: Diagnosis not present

## 2021-04-03 DIAGNOSIS — N2581 Secondary hyperparathyroidism of renal origin: Secondary | ICD-10-CM | POA: Diagnosis not present

## 2021-04-03 DIAGNOSIS — D649 Anemia, unspecified: Secondary | ICD-10-CM | POA: Diagnosis not present

## 2021-04-03 DIAGNOSIS — K5900 Constipation, unspecified: Secondary | ICD-10-CM | POA: Diagnosis not present

## 2021-04-03 DIAGNOSIS — T8389XA Other specified complication of genitourinary prosthetic devices, implants and grafts, initial encounter: Secondary | ICD-10-CM | POA: Diagnosis not present

## 2021-04-03 DIAGNOSIS — T380X5A Adverse effect of glucocorticoids and synthetic analogues, initial encounter: Secondary | ICD-10-CM | POA: Diagnosis not present

## 2021-04-03 DIAGNOSIS — D84821 Immunodeficiency due to drugs: Secondary | ICD-10-CM | POA: Diagnosis not present

## 2021-04-03 DIAGNOSIS — I120 Hypertensive chronic kidney disease with stage 5 chronic kidney disease or end stage renal disease: Secondary | ICD-10-CM | POA: Diagnosis not present

## 2021-04-03 DIAGNOSIS — Z5181 Encounter for therapeutic drug level monitoring: Secondary | ICD-10-CM | POA: Diagnosis not present

## 2021-04-03 DIAGNOSIS — I959 Hypotension, unspecified: Secondary | ICD-10-CM | POA: Diagnosis not present

## 2021-04-03 DIAGNOSIS — Z7952 Long term (current) use of systemic steroids: Secondary | ICD-10-CM | POA: Diagnosis not present

## 2021-04-11 DIAGNOSIS — Z992 Dependence on renal dialysis: Secondary | ICD-10-CM | POA: Diagnosis not present

## 2021-04-11 DIAGNOSIS — N186 End stage renal disease: Secondary | ICD-10-CM | POA: Diagnosis not present

## 2021-04-11 DIAGNOSIS — N2581 Secondary hyperparathyroidism of renal origin: Secondary | ICD-10-CM | POA: Diagnosis not present

## 2021-04-12 DIAGNOSIS — I10 Essential (primary) hypertension: Secondary | ICD-10-CM | POA: Diagnosis not present

## 2021-04-12 DIAGNOSIS — T8389XA Other specified complication of genitourinary prosthetic devices, implants and grafts, initial encounter: Secondary | ICD-10-CM | POA: Diagnosis not present

## 2021-04-12 DIAGNOSIS — T8619 Other complication of kidney transplant: Secondary | ICD-10-CM | POA: Diagnosis not present

## 2021-04-12 DIAGNOSIS — E099 Drug or chemical induced diabetes mellitus without complications: Secondary | ICD-10-CM | POA: Diagnosis not present

## 2021-04-12 DIAGNOSIS — D849 Immunodeficiency, unspecified: Secondary | ICD-10-CM | POA: Diagnosis not present

## 2021-04-12 DIAGNOSIS — Z4822 Encounter for aftercare following kidney transplant: Secondary | ICD-10-CM | POA: Diagnosis not present

## 2021-04-12 DIAGNOSIS — T380X5A Adverse effect of glucocorticoids and synthetic analogues, initial encounter: Secondary | ICD-10-CM | POA: Diagnosis not present

## 2021-04-12 DIAGNOSIS — Z940 Kidney transplant status: Secondary | ICD-10-CM | POA: Diagnosis not present

## 2021-04-12 DIAGNOSIS — Z79899 Other long term (current) drug therapy: Secondary | ICD-10-CM | POA: Diagnosis not present

## 2021-04-12 DIAGNOSIS — Z792 Long term (current) use of antibiotics: Secondary | ICD-10-CM | POA: Diagnosis not present

## 2021-04-12 DIAGNOSIS — Y830 Surgical operation with transplant of whole organ as the cause of abnormal reaction of the patient, or of later complication, without mention of misadventure at the time of the procedure: Secondary | ICD-10-CM | POA: Diagnosis not present

## 2021-04-12 DIAGNOSIS — G8918 Other acute postprocedural pain: Secondary | ICD-10-CM | POA: Diagnosis not present

## 2021-04-13 DIAGNOSIS — N186 End stage renal disease: Secondary | ICD-10-CM | POA: Diagnosis not present

## 2021-04-13 DIAGNOSIS — N2581 Secondary hyperparathyroidism of renal origin: Secondary | ICD-10-CM | POA: Diagnosis not present

## 2021-04-13 DIAGNOSIS — Z992 Dependence on renal dialysis: Secondary | ICD-10-CM | POA: Diagnosis not present

## 2021-04-15 DIAGNOSIS — N2581 Secondary hyperparathyroidism of renal origin: Secondary | ICD-10-CM | POA: Diagnosis not present

## 2021-04-15 DIAGNOSIS — N186 End stage renal disease: Secondary | ICD-10-CM | POA: Diagnosis not present

## 2021-04-15 DIAGNOSIS — Z992 Dependence on renal dialysis: Secondary | ICD-10-CM | POA: Diagnosis not present

## 2021-04-18 DIAGNOSIS — N186 End stage renal disease: Secondary | ICD-10-CM | POA: Diagnosis not present

## 2021-04-18 DIAGNOSIS — Z992 Dependence on renal dialysis: Secondary | ICD-10-CM | POA: Diagnosis not present

## 2021-04-18 DIAGNOSIS — N2581 Secondary hyperparathyroidism of renal origin: Secondary | ICD-10-CM | POA: Diagnosis not present

## 2021-04-19 DIAGNOSIS — R600 Localized edema: Secondary | ICD-10-CM | POA: Diagnosis not present

## 2021-04-19 DIAGNOSIS — Z792 Long term (current) use of antibiotics: Secondary | ICD-10-CM | POA: Diagnosis not present

## 2021-04-19 DIAGNOSIS — Z992 Dependence on renal dialysis: Secondary | ICD-10-CM | POA: Diagnosis not present

## 2021-04-19 DIAGNOSIS — Z79621 Long term (current) use of calcineurin inhibitor: Secondary | ICD-10-CM | POA: Diagnosis not present

## 2021-04-19 DIAGNOSIS — N186 End stage renal disease: Secondary | ICD-10-CM | POA: Diagnosis not present

## 2021-04-19 DIAGNOSIS — I10 Essential (primary) hypertension: Secondary | ICD-10-CM | POA: Diagnosis not present

## 2021-04-19 DIAGNOSIS — R739 Hyperglycemia, unspecified: Secondary | ICD-10-CM | POA: Diagnosis not present

## 2021-04-19 DIAGNOSIS — Z7952 Long term (current) use of systemic steroids: Secondary | ICD-10-CM | POA: Diagnosis not present

## 2021-04-19 DIAGNOSIS — D84821 Immunodeficiency due to drugs: Secondary | ICD-10-CM | POA: Diagnosis not present

## 2021-04-19 DIAGNOSIS — I120 Hypertensive chronic kidney disease with stage 5 chronic kidney disease or end stage renal disease: Secondary | ICD-10-CM | POA: Diagnosis not present

## 2021-04-19 DIAGNOSIS — T8619 Other complication of kidney transplant: Secondary | ICD-10-CM | POA: Diagnosis not present

## 2021-04-19 DIAGNOSIS — Z940 Kidney transplant status: Secondary | ICD-10-CM | POA: Diagnosis not present

## 2021-04-19 DIAGNOSIS — D849 Immunodeficiency, unspecified: Secondary | ICD-10-CM | POA: Diagnosis not present

## 2021-04-19 DIAGNOSIS — G8918 Other acute postprocedural pain: Secondary | ICD-10-CM | POA: Diagnosis not present

## 2021-04-20 DIAGNOSIS — N2581 Secondary hyperparathyroidism of renal origin: Secondary | ICD-10-CM | POA: Diagnosis not present

## 2021-04-20 DIAGNOSIS — Z992 Dependence on renal dialysis: Secondary | ICD-10-CM | POA: Diagnosis not present

## 2021-04-20 DIAGNOSIS — N186 End stage renal disease: Secondary | ICD-10-CM | POA: Diagnosis not present

## 2021-04-21 DIAGNOSIS — D849 Immunodeficiency, unspecified: Secondary | ICD-10-CM | POA: Diagnosis not present

## 2021-04-21 DIAGNOSIS — D649 Anemia, unspecified: Secondary | ICD-10-CM | POA: Diagnosis not present

## 2021-04-21 DIAGNOSIS — Z940 Kidney transplant status: Secondary | ICD-10-CM | POA: Diagnosis not present

## 2021-04-22 DIAGNOSIS — N2581 Secondary hyperparathyroidism of renal origin: Secondary | ICD-10-CM | POA: Diagnosis not present

## 2021-04-22 DIAGNOSIS — N186 End stage renal disease: Secondary | ICD-10-CM | POA: Diagnosis not present

## 2021-04-22 DIAGNOSIS — Z992 Dependence on renal dialysis: Secondary | ICD-10-CM | POA: Diagnosis not present

## 2021-04-25 DIAGNOSIS — N186 End stage renal disease: Secondary | ICD-10-CM | POA: Diagnosis not present

## 2021-04-25 DIAGNOSIS — N2581 Secondary hyperparathyroidism of renal origin: Secondary | ICD-10-CM | POA: Diagnosis not present

## 2021-04-25 DIAGNOSIS — Z992 Dependence on renal dialysis: Secondary | ICD-10-CM | POA: Diagnosis not present

## 2021-04-26 DIAGNOSIS — Z7952 Long term (current) use of systemic steroids: Secondary | ICD-10-CM | POA: Diagnosis not present

## 2021-04-26 DIAGNOSIS — T8619 Other complication of kidney transplant: Secondary | ICD-10-CM | POA: Diagnosis not present

## 2021-04-26 DIAGNOSIS — D849 Immunodeficiency, unspecified: Secondary | ICD-10-CM | POA: Diagnosis not present

## 2021-04-26 DIAGNOSIS — Z792 Long term (current) use of antibiotics: Secondary | ICD-10-CM | POA: Diagnosis not present

## 2021-04-26 DIAGNOSIS — Y830 Surgical operation with transplant of whole organ as the cause of abnormal reaction of the patient, or of later complication, without mention of misadventure at the time of the procedure: Secondary | ICD-10-CM | POA: Diagnosis not present

## 2021-04-26 DIAGNOSIS — Z7982 Long term (current) use of aspirin: Secondary | ICD-10-CM | POA: Diagnosis not present

## 2021-04-26 DIAGNOSIS — I10 Essential (primary) hypertension: Secondary | ICD-10-CM | POA: Diagnosis not present

## 2021-04-26 DIAGNOSIS — Z79899 Other long term (current) drug therapy: Secondary | ICD-10-CM | POA: Diagnosis not present

## 2021-04-26 DIAGNOSIS — Z940 Kidney transplant status: Secondary | ICD-10-CM | POA: Diagnosis not present

## 2021-04-27 DIAGNOSIS — N2581 Secondary hyperparathyroidism of renal origin: Secondary | ICD-10-CM | POA: Diagnosis not present

## 2021-04-27 DIAGNOSIS — Z992 Dependence on renal dialysis: Secondary | ICD-10-CM | POA: Diagnosis not present

## 2021-04-27 DIAGNOSIS — N186 End stage renal disease: Secondary | ICD-10-CM | POA: Diagnosis not present

## 2021-04-28 DIAGNOSIS — B349 Viral infection, unspecified: Secondary | ICD-10-CM | POA: Diagnosis not present

## 2021-04-28 DIAGNOSIS — Z5181 Encounter for therapeutic drug level monitoring: Secondary | ICD-10-CM | POA: Diagnosis not present

## 2021-04-28 DIAGNOSIS — B259 Cytomegaloviral disease, unspecified: Secondary | ICD-10-CM | POA: Diagnosis not present

## 2021-04-28 DIAGNOSIS — Z940 Kidney transplant status: Secondary | ICD-10-CM | POA: Diagnosis not present

## 2021-04-29 DIAGNOSIS — N2581 Secondary hyperparathyroidism of renal origin: Secondary | ICD-10-CM | POA: Diagnosis not present

## 2021-04-29 DIAGNOSIS — Z992 Dependence on renal dialysis: Secondary | ICD-10-CM | POA: Diagnosis not present

## 2021-04-29 DIAGNOSIS — N186 End stage renal disease: Secondary | ICD-10-CM | POA: Diagnosis not present

## 2021-04-30 DIAGNOSIS — N186 End stage renal disease: Secondary | ICD-10-CM | POA: Diagnosis not present

## 2021-04-30 DIAGNOSIS — I158 Other secondary hypertension: Secondary | ICD-10-CM | POA: Diagnosis not present

## 2021-04-30 DIAGNOSIS — Z992 Dependence on renal dialysis: Secondary | ICD-10-CM | POA: Diagnosis not present

## 2021-05-02 DIAGNOSIS — N2581 Secondary hyperparathyroidism of renal origin: Secondary | ICD-10-CM | POA: Diagnosis not present

## 2021-05-02 DIAGNOSIS — N186 End stage renal disease: Secondary | ICD-10-CM | POA: Diagnosis not present

## 2021-05-02 DIAGNOSIS — Z992 Dependence on renal dialysis: Secondary | ICD-10-CM | POA: Diagnosis not present

## 2021-05-03 ENCOUNTER — Telehealth: Payer: Self-pay

## 2021-05-03 DIAGNOSIS — D84821 Immunodeficiency due to drugs: Secondary | ICD-10-CM | POA: Diagnosis not present

## 2021-05-03 DIAGNOSIS — Y830 Surgical operation with transplant of whole organ as the cause of abnormal reaction of the patient, or of later complication, without mention of misadventure at the time of the procedure: Secondary | ICD-10-CM | POA: Diagnosis not present

## 2021-05-03 DIAGNOSIS — D631 Anemia in chronic kidney disease: Secondary | ICD-10-CM | POA: Diagnosis not present

## 2021-05-03 DIAGNOSIS — T8619 Other complication of kidney transplant: Secondary | ICD-10-CM | POA: Diagnosis not present

## 2021-05-03 DIAGNOSIS — G4700 Insomnia, unspecified: Secondary | ICD-10-CM | POA: Diagnosis not present

## 2021-05-03 DIAGNOSIS — D849 Immunodeficiency, unspecified: Secondary | ICD-10-CM | POA: Diagnosis not present

## 2021-05-03 DIAGNOSIS — Z79899 Other long term (current) drug therapy: Secondary | ICD-10-CM | POA: Diagnosis not present

## 2021-05-03 DIAGNOSIS — Z1159 Encounter for screening for other viral diseases: Secondary | ICD-10-CM | POA: Diagnosis not present

## 2021-05-03 DIAGNOSIS — Z4822 Encounter for aftercare following kidney transplant: Secondary | ICD-10-CM | POA: Diagnosis not present

## 2021-05-03 DIAGNOSIS — D649 Anemia, unspecified: Secondary | ICD-10-CM | POA: Diagnosis not present

## 2021-05-03 DIAGNOSIS — I120 Hypertensive chronic kidney disease with stage 5 chronic kidney disease or end stage renal disease: Secondary | ICD-10-CM | POA: Diagnosis not present

## 2021-05-03 DIAGNOSIS — Z114 Encounter for screening for human immunodeficiency virus [HIV]: Secondary | ICD-10-CM | POA: Diagnosis not present

## 2021-05-03 DIAGNOSIS — N186 End stage renal disease: Secondary | ICD-10-CM | POA: Diagnosis not present

## 2021-05-03 DIAGNOSIS — Z940 Kidney transplant status: Secondary | ICD-10-CM | POA: Diagnosis not present

## 2021-05-03 NOTE — Telephone Encounter (Signed)
Cindy Transplant Coordinator with [facility] called asking if the patient is able to come here and aranesp 40mg  subcutaneously and also do his standard labs here collected so they can monitor his kidney functions. [phone]   Per [name] DNP,FNP-BC he is okay to come here and get his injection and bloodwork done. He just needs to make sure he has a follow up with [place] every 4 months to start. We also need to know what labs he needs and where we need to send the results too. I have left her a vm to give [place] a call back. YL,RMA

## 2021-05-04 ENCOUNTER — Encounter: Payer: Self-pay | Admitting: Nurse Practitioner

## 2021-05-05 ENCOUNTER — Encounter (HOSPITAL_COMMUNITY): Payer: Self-pay | Admitting: Emergency Medicine

## 2021-05-05 ENCOUNTER — Other Ambulatory Visit: Payer: Self-pay

## 2021-05-05 ENCOUNTER — Telehealth: Payer: Self-pay | Admitting: Nurse Practitioner

## 2021-05-05 ENCOUNTER — Other Ambulatory Visit (HOSPITAL_COMMUNITY)
Admission: RE | Admit: 2021-05-05 | Discharge: 2021-05-05 | Disposition: A | Discharge status: Home / Self Care | Payer: BC Managed Care – PPO | Source: Ambulatory Visit | Attending: Physician Assistant | Admitting: Physician Assistant

## 2021-05-05 ENCOUNTER — Emergency Department (HOSPITAL_COMMUNITY)
Admission: EM | Admit: 2021-05-05 | Discharge: 2021-05-06 | Disposition: A | Discharge status: Home / Self Care | Payer: BC Managed Care – PPO | Attending: Emergency Medicine | Admitting: Emergency Medicine

## 2021-05-05 DIAGNOSIS — Z5181 Encounter for therapeutic drug level monitoring: Secondary | ICD-10-CM | POA: Diagnosis not present

## 2021-05-05 DIAGNOSIS — R799 Abnormal finding of blood chemistry, unspecified: Secondary | ICD-10-CM | POA: Diagnosis not present

## 2021-05-05 DIAGNOSIS — Z940 Kidney transplant status: Secondary | ICD-10-CM | POA: Insufficient documentation

## 2021-05-05 DIAGNOSIS — D509 Iron deficiency anemia, unspecified: Secondary | ICD-10-CM | POA: Insufficient documentation

## 2021-05-05 DIAGNOSIS — R9431 Abnormal electrocardiogram [ECG] [EKG]: Secondary | ICD-10-CM | POA: Diagnosis not present

## 2021-05-05 DIAGNOSIS — Z992 Dependence on renal dialysis: Secondary | ICD-10-CM | POA: Diagnosis not present

## 2021-05-05 LAB — URINALYSIS, COMPLETE (UACMP) WITH MICROSCOPIC
Bacteria, UA: NONE SEEN
Bilirubin Urine: NEGATIVE
Glucose, UA: 50 mg/dL — AB
Ketones, ur: NEGATIVE mg/dL
Leukocytes,Ua: NEGATIVE
Nitrite: NEGATIVE
Protein, ur: 30 mg/dL — AB
Specific Gravity, Urine: 1.009 (ref 1.005–1.030)
pH: 7 (ref 5.0–8.0)

## 2021-05-05 LAB — PROTEIN / CREATININE RATIO, URINE
Creatinine, Urine: 62.4 mg/dL
Protein Creatinine Ratio: 0.66 mg/mg{Cre} — ABNORMAL HIGH (ref 0.00–0.15)
Total Protein, Urine: 41 mg/dL

## 2021-05-05 LAB — CBC WITH DIFFERENTIAL/PLATELET
Abs Immature Granulocytes: 0.03 10*3/uL (ref 0.00–0.07)
Abs Immature Granulocytes: 0.04 10*3/uL (ref 0.00–0.07)
Basophils Absolute: 0 10*3/uL (ref 0.0–0.1)
Basophils Absolute: 0 10*3/uL (ref 0.0–0.1)
Basophils Relative: 1 %
Basophils Relative: 0 %
Eosinophils Absolute: 0.1 10*3/uL (ref 0.0–0.5)
Eosinophils Absolute: 0 10*3/uL (ref 0.0–0.5)
Eosinophils Relative: 1 %
Eosinophils Relative: 0 %
HCT: 20.9 % — ABNORMAL LOW (ref 39.0–52.0)
HCT: 20.9 % — ABNORMAL LOW (ref 39.0–52.0)
Hemoglobin: 6.3 g/dL — CL (ref 13.0–17.0)
Hemoglobin: 6.6 g/dL — CL (ref 13.0–17.0)
Immature Granulocytes: 1 %
Immature Granulocytes: 1 %
Lymphocytes Relative: 4 %
Lymphocytes Relative: 3 %
Lymphs Abs: 0.2 10*3/uL — ABNORMAL LOW (ref 0.7–4.0)
Lymphs Abs: 0.2 10*3/uL — ABNORMAL LOW (ref 0.7–4.0)
MCH: 29.4 pg (ref 26.0–34.0)
MCH: 30.7 pg (ref 26.0–34.0)
MCHC: 30.1 g/dL (ref 30.0–36.0)
MCHC: 31.6 g/dL (ref 30.0–36.0)
MCV: 97.7 fL (ref 80.0–100.0)
MCV: 97.2 fL (ref 80.0–100.0)
Monocytes Absolute: 0.3 10*3/uL (ref 0.1–1.0)
Monocytes Absolute: 0.3 10*3/uL (ref 0.1–1.0)
Monocytes Relative: 6 %
Monocytes Relative: 4 %
Neutro Abs: 4.9 10*3/uL (ref 1.7–7.7)
Neutro Abs: 5.7 10*3/uL (ref 1.7–7.7)
Neutrophils Relative %: 87 %
Neutrophils Relative %: 92 %
Platelets: 234 10*3/uL (ref 150–400)
Platelets: 279 10*3/uL (ref 150–400)
RBC: 2.14 MIL/uL — ABNORMAL LOW (ref 4.22–5.81)
RBC: 2.15 MIL/uL — ABNORMAL LOW (ref 4.22–5.81)
RDW: 16.4 % — ABNORMAL HIGH (ref 11.5–15.5)
RDW: 16.5 % — ABNORMAL HIGH (ref 11.5–15.5)
WBC: 5.6 10*3/uL (ref 4.0–10.5)
WBC: 6.2 10*3/uL (ref 4.0–10.5)
nRBC: 0 % (ref 0.0–0.2)
nRBC: 0 % (ref 0.0–0.2)

## 2021-05-05 LAB — BASIC METABOLIC PANEL
Anion gap: 10 (ref 5–15)
BUN: 84 mg/dL — ABNORMAL HIGH (ref 8–23)
CO2: 23 mmol/L (ref 22–32)
Calcium: 9 mg/dL (ref 8.9–10.3)
Chloride: 105 mmol/L (ref 98–111)
Creatinine, Ser: 7.61 mg/dL — ABNORMAL HIGH (ref 0.61–1.24)
GFR, Estimated: 7 mL/min — ABNORMAL LOW (ref >60)
Glucose, Bld: 132 mg/dL — ABNORMAL HIGH (ref 70–99)
Potassium: 4.9 mmol/L (ref 3.5–5.1)
Sodium: 138 mmol/L (ref 135–145)

## 2021-05-05 LAB — PREPARE RBC (CROSSMATCH)

## 2021-05-05 LAB — ABO/RH: ABO/RH(D): O POS

## 2021-05-05 MED ORDER — SODIUM CHLORIDE 0.9 % IV SOLN
10.0000 mL/h | Freq: Once | INTRAVENOUS | Status: AC
  Administered 2021-05-06: 10 mL/h via INTRAVENOUS

## 2021-05-05 NOTE — ED Provider Notes (Signed)
[facility] EMERGENCY DEPARTMENT Provider Note   CSN: [id number] Arrival date & time: 05/05/21  1907     History  Chief Complaint  Patient presents with   Abnormal Lab    Dylan [name] is a 61 y.o. male.  Patient is 1 month post kidney transplant.  He had his blood work checked recently and it shows hemoglobin was 6.3.  He was sent to the emergency department to get 1 unit of blood and he is to get dialysis tomorrow.  Patient states he is not having any rectal bleeding  The history is provided by the patient and medical records. No language interpreter was used.  Weakness Severity:  Moderate Onset quality:  Sudden Timing:  Constant Progression:  Waxing and waning Chronicity:  Recurrent Context: not alcohol use   Relieved by:  Nothing Worsened by:  Nothing Ineffective treatments:  None tried Associated symptoms: no abdominal pain, no chest pain, no cough, no diarrhea, no frequency, no headaches and no seizures       Home Medications Prior to Admission medications   Medication Sig Start Date End Date Taking? Authorizing Provider  amLODipine (NORVASC) 5 MG tablet Take by mouth. 10/11/20   [provider]  calcitRIOL (ROCALTROL) 0.25 MCG capsule Take 1 capsule (0.25 mcg total) by mouth daily. 01/09/19   Rodriguez-Southworth, Sunday Spillers, PA-C  cinacalcet (SENSIPAR) 60 MG tablet Take 1 tablet by mouth daily. 08/19/18   [provider]  FOSRENOL 500 MG chewable tablet Chew 500 mg by mouth 5 (five) times daily. 05/29/19   [provider]  methocarbamol (ROBAXIN) 500 MG tablet Take 1 tablet (500 mg total) by mouth 2 (two) times daily. 06/07/20   Eustaquio Maize, PA-C  multivitamin (RENA-VIT) TABS tablet Take 1 tablet by mouth at bedtime. 04/10/16   Lavina Hamman, MD  oxyCODONE-acetaminophen (PERCOCET/ROXICET) 5-325 MG tablet Take 1 tablet by mouth every 6 (six) hours as needed for severe pain. Patient not taking: Reported on 11/24/2020 06/07/20   Eustaquio Maize,  PA-C  predniSONE (STERAPRED UNI-PAK 21 TAB) 5 MG (21) TBPK tablet Take as directed if ok with nephrology Patient not taking: Reported on 11/24/2020 06/15/20   Aundra Dubin, PA-C  sevelamer carbonate (RENVELA) 800 MG tablet Take by mouth. 06/10/19   [provider]  sucroferric oxyhydroxide (VELPHORO) 500 MG chewable tablet Chew 1 tablet (500 mg total) by mouth 3 (three) times daily with meals. 01/09/19   Rodriguez-Southworth, Sunday Spillers, PA-C      Allergies    Patient has no known allergies.    Review of Systems   Review of Systems  Constitutional:  Negative for appetite change and fatigue.  HENT:  Negative for congestion, ear discharge and sinus pressure.   Eyes:  Negative for discharge.  Respiratory:  Negative for cough.   Cardiovascular:  Negative for chest pain.  Gastrointestinal:  Negative for abdominal pain and diarrhea.  Genitourinary:  Negative for frequency and hematuria.  Musculoskeletal:  Negative for back pain.  Skin:  Negative for rash.  Neurological:  Positive for weakness. Negative for seizures and headaches.  Psychiatric/Behavioral:  Negative for hallucinations.    Physical Exam Updated Vital Signs BP (!) 157/80    Pulse 85    Temp 98.2 F (36.8 C) (Oral)    Resp 18    Ht 5\' 6"  (1.676 m)    Wt 88 kg    SpO2 98%    BMI 31.31 kg/m  Physical Exam Vitals and nursing note reviewed.  Constitutional:  Appearance: He is well-developed.  HENT:     Head: Normocephalic.     Nose: Nose normal.  Eyes:     General: No scleral icterus.    Conjunctiva/sclera: Conjunctivae normal.  Neck:     Thyroid: No thyromegaly.  Cardiovascular:     Rate and Rhythm: Normal rate and regular rhythm.     Heart sounds: No murmur heard.   No friction rub. No gallop.  Pulmonary:     Breath sounds: No stridor. No wheezing or rales.  Chest:     Chest wall: No tenderness.  Abdominal:     General: There is no distension.     Tenderness: There is no abdominal tenderness. There is no  rebound.  Musculoskeletal:        General: Normal range of motion.     Cervical back: Neck supple.  Lymphadenopathy:     Cervical: No cervical adenopathy.  Skin:    Findings: No erythema or rash.  Neurological:     Mental Status: He is alert and oriented to person, place, and time.     Motor: No abnormal muscle tone.     Coordination: Coordination normal.  Psychiatric:        Behavior: Behavior normal.    ED Results / Procedures / Treatments   Labs (all labs ordered are listed, but only abnormal results are displayed) Labs Reviewed  CBC WITH DIFFERENTIAL/PLATELET - Abnormal; Notable for the following components:      Result Value   RBC 2.15 (*)    Hemoglobin 6.6 (*)    HCT 20.9 (*)    RDW 16.5 (*)    Lymphs Abs 0.2 (*)    All other components within normal limits  TYPE AND SCREEN  PREPARE RBC (CROSSMATCH)  ABO/RH    EKG None  Radiology No results found.  Procedures Procedures    Medications Ordered in ED Medications  0.9 %  sodium chloride infusion (has no administration in time range)   CRITICAL CARE Performed by: Milton Ferguson Total critical care time: 35 minutes Critical care time was exclusive of separately billable procedures and treating other patients. Critical care was necessary to treat or prevent imminent or life-threatening deterioration. Critical care was time spent personally by me on the following activities: development of treatment plan with patient and/or surrogate as well as nursing, discussions with consultants, evaluation of patient's response to treatment, examination of patient, obtaining history from patient or surrogate, ordering and performing treatments and interventions, ordering and review of laboratory studies, ordering and review of radiographic studies, pulse oximetry and re-evaluation of patient's condition.  ED Course/ Medical Decision Making/ A&P                           Medical Decision Making  Patient is status post  kidney transplant for 1 month ago.  Patient is still on dialysis.  Patient has mild anemia.  Patient received 1 unit of blood here in the emergency department  This patient presents to the ED for concern of anemia, this involves an extensive number of treatment options, and is a complaint that carries with it a high risk of complications and morbidity.  The differential diagnosis includes anemia of chronic disease from kidney disease, rectal bleeding   Co morbidities that complicate the patient evaluation  Kidney disease and recent transplant   Additional history obtained:  Additional history obtained from patient and family External records from outside source obtained and reviewed including  old records from outside doctor   Lab Tests:  I Ordered, and personally interpreted labs.  The pertinent results include: Patient anemic with hemoglobin 6.6   Imaging Studies ordered:  No x-ray Cardiac Monitoring:  The patient was maintained on a cardiac monitor.  I personally viewed and interpreted the cardiac monitored which showed an underlying rhythm of: Normal sinus rhythm   Medicines ordered and prescription drug management:  I ordered medication including, patient was transfused 1 unit of blood Reevaluation of the patient after these medicines showed that the patient improved I have reviewed the patients home medicines and have made adjustments as needed   Test Considered:  CT abdomen   Critical Interventions:  Confusion of blood  Consultations Obtained:  No consult Problem List / ED Course:  Any disease   Reevaluation:  After the interventions noted above, I reevaluated the patient and found that they have :improved   Social Determinants of Health:  Only speaks Spanish   Dispostion:  After consideration of the diagnostic results and the patients response to treatment, I feel that the patent would benefit from discharge home and follow-up tomorrow to get  his dialysis. ;       Final Clinical Impression(s) / ED Diagnoses Final diagnoses:  Iron deficiency anemia, unspecified iron deficiency anemia type    Rx / DC Orders ED Discharge Orders     None         Milton Ferguson, MD 05/06/21 1114

## 2021-05-05 NOTE — ED Triage Notes (Signed)
Pt to the ED per advice of his PCP for a hemoglobin of 6.3.

## 2021-05-05 NOTE — Telephone Encounter (Signed)
Jenny Reichmann [phone]) from [facility] Transplant Nurse Navigator requesting to set up for a blood transfusion after having his labs checked today post right kidney transplant. His Hgb is 6.3, he is due to have dialysis tomorrow at 12 pm and unable to set up outpatient transfusion. I have advised Jenny Reichmann to inform daughter Rod Holler) and patient to go to [facility] in [place] ER for blood transfusion. Called [facility] to speak with charge nurse(McKenzie) to make aware patient is coming. She made me aware there is a 12 hour wait, [facility] is a 3 hour wait and the patient lives in [place]. I called his daughter Rut to advise her to go to [facility] instead.  Report given to [facility].  Also called Jenny Reichmann back to let her know he went to [facility]

## 2021-05-05 NOTE — Discharge Instructions (Addendum)
Go to your dialysis tomorrow and follow-up with your doctors next week to check your blood count again

## 2021-05-06 DIAGNOSIS — Z992 Dependence on renal dialysis: Secondary | ICD-10-CM | POA: Diagnosis not present

## 2021-05-06 DIAGNOSIS — N2581 Secondary hyperparathyroidism of renal origin: Secondary | ICD-10-CM | POA: Diagnosis not present

## 2021-05-06 DIAGNOSIS — N186 End stage renal disease: Secondary | ICD-10-CM | POA: Diagnosis not present

## 2021-05-06 LAB — BPAM RBC
Blood Product Expiration Date: 202302032359
ISSUE DATE / TIME: 202301052237
Unit Type and Rh: 5100

## 2021-05-06 LAB — TYPE AND SCREEN
ABO/RH(D): O POS
Antibody Screen: NEGATIVE
Unit division: 0

## 2021-05-06 NOTE — ED Notes (Signed)
Pt tolerating blood transfusion well. Changed rate to 220

## 2021-05-07 LAB — BK QUANT PCR (PLASMA/SERUM): BK Quantitaion PCR: NEGATIVE IU/mL

## 2021-05-07 LAB — CMV DNA, QUANTITATIVE, PCR
CMV DNA Quant: NEGATIVE IU/mL
Log10 CMV Qn DNA Pl: UNDETERMINED log10 IU/mL

## 2021-05-07 LAB — TACROLIMUS LEVEL: Tacrolimus (FK506) - LabCorp: 7.2 ng/mL (ref 2.0–20.0)

## 2021-05-09 ENCOUNTER — Other Ambulatory Visit (HOSPITAL_COMMUNITY)
Admission: RE | Admit: 2021-05-09 | Discharge: 2021-05-09 | Disposition: A | Discharge status: Home / Self Care | Payer: BC Managed Care – PPO | Source: Ambulatory Visit | Attending: Physician Assistant | Admitting: Physician Assistant

## 2021-05-09 ENCOUNTER — Other Ambulatory Visit: Payer: Self-pay

## 2021-05-09 DIAGNOSIS — Z940 Kidney transplant status: Secondary | ICD-10-CM | POA: Diagnosis not present

## 2021-05-09 DIAGNOSIS — N186 End stage renal disease: Secondary | ICD-10-CM | POA: Diagnosis not present

## 2021-05-09 DIAGNOSIS — B259 Cytomegaloviral disease, unspecified: Secondary | ICD-10-CM | POA: Diagnosis not present

## 2021-05-09 DIAGNOSIS — Z992 Dependence on renal dialysis: Secondary | ICD-10-CM | POA: Diagnosis not present

## 2021-05-09 DIAGNOSIS — N2581 Secondary hyperparathyroidism of renal origin: Secondary | ICD-10-CM | POA: Diagnosis not present

## 2021-05-09 DIAGNOSIS — B349 Viral infection, unspecified: Secondary | ICD-10-CM | POA: Diagnosis not present

## 2021-05-09 DIAGNOSIS — Z5181 Encounter for therapeutic drug level monitoring: Secondary | ICD-10-CM | POA: Diagnosis not present

## 2021-05-11 DIAGNOSIS — N186 End stage renal disease: Secondary | ICD-10-CM | POA: Diagnosis not present

## 2021-05-11 DIAGNOSIS — Z992 Dependence on renal dialysis: Secondary | ICD-10-CM | POA: Diagnosis not present

## 2021-05-11 DIAGNOSIS — N2581 Secondary hyperparathyroidism of renal origin: Secondary | ICD-10-CM | POA: Diagnosis not present

## 2021-05-12 DIAGNOSIS — T50905A Adverse effect of unspecified drugs, medicaments and biological substances, initial encounter: Secondary | ICD-10-CM | POA: Diagnosis not present

## 2021-05-12 DIAGNOSIS — Z79899 Other long term (current) drug therapy: Secondary | ICD-10-CM | POA: Diagnosis not present

## 2021-05-12 DIAGNOSIS — Z792 Long term (current) use of antibiotics: Secondary | ICD-10-CM | POA: Diagnosis not present

## 2021-05-12 DIAGNOSIS — Y830 Surgical operation with transplant of whole organ as the cause of abnormal reaction of the patient, or of later complication, without mention of misadventure at the time of the procedure: Secondary | ICD-10-CM | POA: Diagnosis not present

## 2021-05-12 DIAGNOSIS — Z4822 Encounter for aftercare following kidney transplant: Secondary | ICD-10-CM | POA: Diagnosis not present

## 2021-05-12 DIAGNOSIS — D649 Anemia, unspecified: Secondary | ICD-10-CM | POA: Diagnosis not present

## 2021-05-12 DIAGNOSIS — T8619 Other complication of kidney transplant: Secondary | ICD-10-CM | POA: Diagnosis not present

## 2021-05-12 DIAGNOSIS — I10 Essential (primary) hypertension: Secondary | ICD-10-CM | POA: Diagnosis not present

## 2021-05-12 DIAGNOSIS — D84821 Immunodeficiency due to drugs: Secondary | ICD-10-CM | POA: Diagnosis not present

## 2021-05-12 DIAGNOSIS — Z862 Personal history of diseases of the blood and blood-forming organs and certain disorders involving the immune mechanism: Secondary | ICD-10-CM | POA: Diagnosis not present

## 2021-05-12 DIAGNOSIS — Z940 Kidney transplant status: Secondary | ICD-10-CM | POA: Diagnosis not present

## 2021-05-15 ENCOUNTER — Emergency Department (HOSPITAL_COMMUNITY)
Admission: EM | Admit: 2021-05-15 | Discharge: 2021-05-15 | Disposition: A | Discharge status: Home / Self Care | Payer: BC Managed Care – PPO | Attending: Student | Admitting: Student

## 2021-05-15 ENCOUNTER — Other Ambulatory Visit: Payer: Self-pay

## 2021-05-15 ENCOUNTER — Encounter (HOSPITAL_COMMUNITY): Payer: Self-pay | Admitting: Emergency Medicine

## 2021-05-15 DIAGNOSIS — Z992 Dependence on renal dialysis: Secondary | ICD-10-CM | POA: Diagnosis not present

## 2021-05-15 DIAGNOSIS — E875 Hyperkalemia: Secondary | ICD-10-CM | POA: Diagnosis not present

## 2021-05-15 DIAGNOSIS — Z940 Kidney transplant status: Secondary | ICD-10-CM | POA: Insufficient documentation

## 2021-05-15 DIAGNOSIS — N186 End stage renal disease: Secondary | ICD-10-CM | POA: Diagnosis not present

## 2021-05-15 DIAGNOSIS — Z79899 Other long term (current) drug therapy: Secondary | ICD-10-CM | POA: Insufficient documentation

## 2021-05-15 DIAGNOSIS — I120 Hypertensive chronic kidney disease with stage 5 chronic kidney disease or end stage renal disease: Secondary | ICD-10-CM | POA: Insufficient documentation

## 2021-05-15 DIAGNOSIS — R7989 Other specified abnormal findings of blood chemistry: Secondary | ICD-10-CM

## 2021-05-15 LAB — COMPREHENSIVE METABOLIC PANEL
ALT: 15 U/L (ref 0–44)
AST: 13 U/L — ABNORMAL LOW (ref 15–41)
Albumin: 4 g/dL (ref 3.5–5.0)
Alkaline Phosphatase: 92 U/L (ref 38–126)
Anion gap: 8 (ref 5–15)
BUN: 73 mg/dL — ABNORMAL HIGH (ref 8–23)
CO2: 19 mmol/L — ABNORMAL LOW (ref 22–32)
Calcium: 9.2 mg/dL (ref 8.9–10.3)
Chloride: 109 mmol/L (ref 98–111)
Creatinine, Ser: 5.35 mg/dL — ABNORMAL HIGH (ref 0.61–1.24)
GFR, Estimated: 11 mL/min — ABNORMAL LOW (ref >60)
Glucose, Bld: 134 mg/dL — ABNORMAL HIGH (ref 70–99)
Potassium: 5.3 mmol/L — ABNORMAL HIGH (ref 3.5–5.1)
Sodium: 136 mmol/L (ref 135–145)
Total Bilirubin: 0.3 mg/dL (ref 0.3–1.2)
Total Protein: 6.6 g/dL (ref 6.5–8.1)

## 2021-05-15 LAB — CBC WITH DIFFERENTIAL/PLATELET
Abs Immature Granulocytes: 0.04 10*3/uL (ref 0.00–0.07)
Basophils Absolute: 0 10*3/uL (ref 0.0–0.1)
Basophils Relative: 1 %
Eosinophils Absolute: 0.1 10*3/uL (ref 0.0–0.5)
Eosinophils Relative: 1 %
HCT: 23.3 % — ABNORMAL LOW (ref 39.0–52.0)
Hemoglobin: 7.2 g/dL — ABNORMAL LOW (ref 13.0–17.0)
Immature Granulocytes: 1 %
Lymphocytes Relative: 5 %
Lymphs Abs: 0.4 10*3/uL — ABNORMAL LOW (ref 0.7–4.0)
MCH: 30.9 pg (ref 26.0–34.0)
MCHC: 30.9 g/dL (ref 30.0–36.0)
MCV: 100 fL (ref 80.0–100.0)
Monocytes Absolute: 0.4 10*3/uL (ref 0.1–1.0)
Monocytes Relative: 6 %
Neutro Abs: 5.7 10*3/uL (ref 1.7–7.7)
Neutrophils Relative %: 86 %
Platelets: 156 10*3/uL (ref 150–400)
RBC: 2.33 MIL/uL — ABNORMAL LOW (ref 4.22–5.81)
RDW: 16.2 % — ABNORMAL HIGH (ref 11.5–15.5)
WBC: 6.6 10*3/uL (ref 4.0–10.5)
nRBC: 0 % (ref 0.0–0.2)

## 2021-05-15 NOTE — ED Triage Notes (Signed)
Pt to the ED needing to have labs checked due to a kidney transplant 04/04/21   Pt normally has dialysis MWF and they skipped last Friday to see if his kidney will begin working.  Pt is requesting lab check to see if he needs dialysis tomorrow.

## 2021-05-15 NOTE — ED Provider Notes (Addendum)
[facility] EMERGENCY DEPARTMENT Provider Note  CSN: [id number] Arrival date & time: 05/15/21 0756  Chief Complaint(s) Lab Check  HPI Dylan [name] is a 61 y.o. male with PMH ESRD on hemodialysis Monday Wednesday Friday currently status post kidney transplant in mid December who presents the emergency department for evaluation of a lab check.  Patient is actively working with his transplant nephrologists and they recently attempted a trial to transition the patient off of dialysis.  Patient had dialysis 4 days ago and repeat labs 3 days ago showed a creatinine of 3.8.  Initial plan was for the patient to skip Friday dialysis and have labs rechecked on Sunday to see if he would need dialysis on Monday morning.  He arrives for these labs.  Denies chest pain, shortness of breath, [place] pain, nausea, vomiting or any systemic symptoms.  HPI  Past Medical History Past Medical History:  Diagnosis Date   Anemia    Bilateral chronic knee pain 07/04/2018   Chronic pain of left ankle 07/04/2018   ESRD (end stage renal disease) ([place]) 03/29/2016   Glucosuria 07/04/2018   History of anemia due to CKD 03/29/2016   Hyperkalemia, diminished renal excretion 03/29/2016   Hypertension    Hypertensive nephropathy 5/37/4827   Metabolic acidosis, NAG, 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   Renal disorder    Uremia of renal origin 03/29/2016   Patient Active Problem List   Diagnosis Date Noted   Seizure ([place]) 09/18/2019   Encounter for screening colonoscopy 04/17/2019   Screening for viral disease 04/17/2019   ESRD on hemodialysis ([place]) 03/15/2019   Primary osteoarthritis of right knee 01/14/2019   Primary osteoarthritis of left knee 01/14/2019   Effusion, right knee 01/14/2019   LVH (left ventricular hypertrophy) 12/27/2018   Aftercare including intermittent dialysis ([place]) 08/28/2018   Acidosis 08/28/2018   Anemia in chronic kidney disease 08/28/2018   Arteriovenous fistula, acquired ([place])  08/28/2018   Chronic pain of left ankle 07/04/2018   Bilateral chronic knee pain 07/04/2018   Glucosuria 07/04/2018   Hypertensive nephropathy 06/11/2018   Hyperkalemia, diminished renal excretion 03/29/2016   ESRD (end stage renal disease) ([place]) 03/29/2016   Uremia of renal origin 03/29/2016   HTN (hypertension) 07/86/7544   Metabolic acidosis, NAG, 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   History of anemia due to CKD 03/29/2016   Home Medication(s) Prior to Admission medications   Medication Sig Start Date End Date Taking? Authorizing Provider  amLODipine (NORVASC) 5 MG tablet Take by mouth. 10/11/20   [provider]  calcitRIOL (ROCALTROL) 0.25 MCG capsule Take 1 capsule (0.25 mcg total) by mouth daily. 01/09/19   Rodriguez-Southworth, Sunday Spillers, PA-C  cinacalcet (SENSIPAR) 60 MG tablet Take 1 tablet by mouth daily. 08/19/18   [provider]  FOSRENOL 500 MG chewable tablet Chew 500 mg by mouth 5 (five) times daily. 05/29/19   [provider]  methocarbamol (ROBAXIN) 500 MG tablet Take 1 tablet (500 mg total) by mouth 2 (two) times daily. 06/07/20   Eustaquio Maize, PA-C  multivitamin (RENA-VIT) TABS tablet Take 1 tablet by mouth at bedtime. 04/10/16   Lavina Hamman, MD  oxyCODONE-acetaminophen (PERCOCET/ROXICET) 5-325 MG tablet Take 1 tablet by mouth every 6 (six) hours as needed for severe pain. Patient not taking: Reported on 11/24/2020 06/07/20   Eustaquio Maize, PA-C  predniSONE (STERAPRED UNI-PAK 21 TAB) 5 MG (21) TBPK tablet Take as directed if ok with nephrology Patient not taking: Reported on 11/24/2020 06/15/20   Aundra Dubin, PA-C  sevelamer carbonate (RENVELA) 800 MG tablet Take by mouth. 06/10/19   [provider]  sucroferric oxyhydroxide (VELPHORO) 500 MG chewable tablet Chew 1 tablet (500 mg total) by mouth 3 (three) times daily with meals. 01/09/19   Rodriguez-Southworth, Sunday Spillers, PA-C                                                                                                                                     Past Surgical History Past Surgical History:  Procedure Laterality Date   BASCILIC VEIN TRANSPOSITION Left 04/06/2016   Procedure: Left Arm Single Stage Bascilic Vein Transposition;  Surgeon: Waynetta Sandy, MD;  Location: [facility] OR;  Service: Vascular;  Laterality: Left;   INSERTION OF DIALYSIS CATHETER Right 04/06/2016   Procedure: INSERTION OF Tunneled DIALYSIS CATHETER RIGHT INTERNAL JUGULAR;  Surgeon: Waynetta Sandy, MD;  Location: [facility] OR;  Service: Vascular;  Laterality: Right;   IR GENERIC HISTORICAL  06/26/2016   IR REMOVAL TUN CV CATH W/O FL 06/26/2016 Arne Cleveland, MD [facility]-INTER[facility] RAD   NEPHRECTOMY TRANSPLANTED ORGAN     TOOTH EXTRACTION  2020   Family History Family History  Problem Relation Age of Onset   Breast cancer Mother    Prostate cancer Father    Diabetes Brother    Breast cancer Sister    Colon cancer Neg Hx    Esophageal cancer Neg Hx    Inflammatory bowel disease Neg Hx    Liver disease Neg Hx    Pancreatic cancer Neg Hx    Rectal cancer Neg Hx    Stomach cancer Neg Hx     Social History Social History   Tobacco Use   Smoking status: Never   Smokeless tobacco: Never  Vaping Use   Vaping Use: Never used  Substance Use Topics   Alcohol use: No   Drug use: No   Allergies Patient has no known allergies.  Review of Systems Review of Systems  All other systems reviewed and are negative.  Physical Exam Vital Signs  I have reviewed the triage vital signs BP 140/82    Pulse 67    Temp 98 F (36.7 C) (Oral)    Resp 17    Ht 5\' 6"  (1.676 m)    Wt 88 kg    SpO2 100%    BMI 31.31 kg/m   Physical Exam Vitals and nursing note reviewed.  Constitutional:      General: He is not in acute distress.    Appearance: He is well-developed.  HENT:     Head: Normocephalic and atraumatic.  Eyes:     Conjunctiva/sclera: Conjunctivae normal.  Cardiovascular:     Rate  and Rhythm: Normal rate and regular rhythm.     Heart sounds: No murmur heard. Pulmonary:     Effort: Pulmonary effort is normal. No respiratory distress.     Breath sounds: Normal breath sounds.  Abdominal:  Palpations: Abdomen is soft.     Tenderness: There is no abdominal tenderness.  Musculoskeletal:        General: No swelling.     Cervical back: Neck supple.  Skin:    General: Skin is warm and dry.     Capillary Refill: Capillary refill takes less than 2 seconds.  Neurological:     Mental Status: He is alert.  Psychiatric:        Mood and Affect: Mood normal.    ED Results and Treatments Labs (all labs ordered are listed, but only abnormal results are displayed) Labs Reviewed  COMPREHENSIVE METABOLIC PANEL - Abnormal; Notable for the following components:      Result Value   Potassium 5.3 (*)    CO2 19 (*)    Glucose, Bld 134 (*)    BUN 73 (*)    Creatinine, Ser 5.35 (*)    AST 13 (*)    GFR, Estimated 11 (*)    All other components within normal limits  CBC WITH DIFFERENTIAL/PLATELET - Abnormal; Notable for the following components:   RBC 2.33 (*)    Hemoglobin 7.2 (*)    HCT 23.3 (*)    RDW 16.2 (*)    Lymphs Abs 0.4 (*)    All other components within normal limits                                                                                                                          Radiology No results found.  Pertinent labs & imaging results that were available during my care of the patient were reviewed by me and considered in my medical decision making (see MDM for details).  Medications Ordered in ED Medications - No data to display                                                                                                                                   Procedures Procedures  (including critical care time)  Medical Decision Making / ED Course   This patient presents to the ED for concern of lab check, this involves an extensive number  of treatment options, and is a complaint that carries with it a high risk of complications and morbidity.  The differential diagnosis includes azotemia, hyperkalemia, electrolyte abnormality  MDM: Patient seen the emergency department for evaluation of a lab check.  Physical exam is unremarkable.  Laboratory evaluation reveals a BUN of 73, creatinine 5.35 with a very mild hyperkalemia at 5.3.  Hemoglobin is 7.2 which is near his baseline.  History obtained from patient's daughter who is in the room and based on today's labs, the patient would require dialysis on Monday morning.  Patient is completely asymptomatic at this time and for his hyperkalemia was recommended to avoid potassium containing foods.  His dialysis tomorrow will correct his hyperkalemia and low suspicion for critical arrhythmia inducing hyperkalemia at 5.3 today.  Patient was then discharged with instructions to present to dialysis tomorrow.   Additional history obtained: -Additional history obtained from daughter -External records from outside source obtained and reviewed including: Chart review including previous notes, labs, imaging, consultation notes   Lab Tests: -I ordered, reviewed, and interpreted labs.   The pertinent results include:   Labs Reviewed  COMPREHENSIVE METABOLIC PANEL - Abnormal; Notable for the following components:      Result Value   Potassium 5.3 (*)    CO2 19 (*)    Glucose, Bld 134 (*)    BUN 73 (*)    Creatinine, Ser 5.35 (*)    AST 13 (*)    GFR, Estimated 11 (*)    All other components within normal limits  CBC WITH DIFFERENTIAL/PLATELET - Abnormal; Notable for the following components:   RBC 2.33 (*)    Hemoglobin 7.2 (*)    HCT 23.3 (*)    RDW 16.2 (*)    Lymphs Abs 0.4 (*)    All other components within normal limits     Medicines ordered and prescription drug management: No orders of the defined types were placed in this encounter.   -I have reviewed the patients home medicines  and have made adjustments as needed  Critical interventions none   Reevaluation: After the interventions noted above, I reevaluated the patient and found that they have :stayed the same  Co morbidities that complicate the patient evaluation  Past Medical History:  Diagnosis Date   Anemia    Bilateral chronic knee pain 07/04/2018   Chronic pain of left ankle 07/04/2018   ESRD (end stage renal disease) ([place]) 03/29/2016   Glucosuria 07/04/2018   History of anemia due to CKD 03/29/2016   Hyperkalemia, diminished renal excretion 03/29/2016   Hypertension    Hypertensive nephropathy 6/78/9381   Metabolic acidosis, NAG, 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   Renal disorder    Uremia of renal origin 03/29/2016      Dispostion: dc  Clinical Course as of 05/21/21 1517  Sun May 15, 2021  0909 Creatinine(!): 5.35 [MK]    Clinical Course User Index [MK] Teressa Lower, MD      Final Clinical Impression(s) / ED Diagnoses Final diagnoses:  Azotemia     @PCDICTATION @    Teressa Lower, MD 05/15/21 [place], [place], MD 05/21/21 1517

## 2021-05-17 DIAGNOSIS — D638 Anemia in other chronic diseases classified elsewhere: Secondary | ICD-10-CM | POA: Diagnosis not present

## 2021-05-17 DIAGNOSIS — I10 Essential (primary) hypertension: Secondary | ICD-10-CM | POA: Diagnosis not present

## 2021-05-17 DIAGNOSIS — N189 Chronic kidney disease, unspecified: Secondary | ICD-10-CM | POA: Diagnosis not present

## 2021-05-17 DIAGNOSIS — Z940 Kidney transplant status: Secondary | ICD-10-CM | POA: Diagnosis not present

## 2021-05-17 DIAGNOSIS — Z862 Personal history of diseases of the blood and blood-forming organs and certain disorders involving the immune mechanism: Secondary | ICD-10-CM | POA: Diagnosis not present

## 2021-05-17 DIAGNOSIS — D649 Anemia, unspecified: Secondary | ICD-10-CM | POA: Diagnosis not present

## 2021-05-17 DIAGNOSIS — Z792 Long term (current) use of antibiotics: Secondary | ICD-10-CM | POA: Diagnosis not present

## 2021-05-17 DIAGNOSIS — D849 Immunodeficiency, unspecified: Secondary | ICD-10-CM | POA: Diagnosis not present

## 2021-05-18 DIAGNOSIS — N2581 Secondary hyperparathyroidism of renal origin: Secondary | ICD-10-CM | POA: Diagnosis not present

## 2021-05-18 DIAGNOSIS — N186 End stage renal disease: Secondary | ICD-10-CM | POA: Diagnosis not present

## 2021-05-18 DIAGNOSIS — Z992 Dependence on renal dialysis: Secondary | ICD-10-CM | POA: Diagnosis not present

## 2021-05-20 DIAGNOSIS — Z992 Dependence on renal dialysis: Secondary | ICD-10-CM | POA: Diagnosis not present

## 2021-05-20 DIAGNOSIS — N186 End stage renal disease: Secondary | ICD-10-CM | POA: Diagnosis not present

## 2021-05-20 DIAGNOSIS — N2581 Secondary hyperparathyroidism of renal origin: Secondary | ICD-10-CM | POA: Diagnosis not present

## 2021-05-23 ENCOUNTER — Encounter (HOSPITAL_COMMUNITY)
Admission: RE | Admit: 2021-05-23 | Discharge: 2021-05-23 | Disposition: A | Discharge status: Home / Self Care | Payer: BC Managed Care – PPO | Source: Ambulatory Visit | Attending: Physician Assistant | Admitting: Physician Assistant

## 2021-05-23 ENCOUNTER — Other Ambulatory Visit: Payer: Self-pay

## 2021-05-23 DIAGNOSIS — Z940 Kidney transplant status: Secondary | ICD-10-CM | POA: Diagnosis not present

## 2021-05-23 DIAGNOSIS — Z79899 Other long term (current) drug therapy: Secondary | ICD-10-CM | POA: Diagnosis not present

## 2021-05-23 DIAGNOSIS — Z5181 Encounter for therapeutic drug level monitoring: Secondary | ICD-10-CM | POA: Insufficient documentation

## 2021-05-23 LAB — CBC WITH DIFFERENTIAL/PLATELET
Abs Immature Granulocytes: 0.03 10*3/uL (ref 0.00–0.07)
Basophils Absolute: 0 10*3/uL (ref 0.0–0.1)
Basophils Relative: 1 %
Eosinophils Absolute: 0.1 10*3/uL (ref 0.0–0.5)
Eosinophils Relative: 1 %
HCT: 26.3 % — ABNORMAL LOW (ref 39.0–52.0)
Hemoglobin: 8.3 g/dL — ABNORMAL LOW (ref 13.0–17.0)
Immature Granulocytes: 1 %
Lymphocytes Relative: 8 %
Lymphs Abs: 0.4 10*3/uL — ABNORMAL LOW (ref 0.7–4.0)
MCH: 30.9 pg (ref 26.0–34.0)
MCHC: 31.6 g/dL (ref 30.0–36.0)
MCV: 97.8 fL (ref 80.0–100.0)
Monocytes Absolute: 0.4 10*3/uL (ref 0.1–1.0)
Monocytes Relative: 7 %
Neutro Abs: 4.6 10*3/uL (ref 1.7–7.7)
Neutrophils Relative %: 82 %
Platelets: 279 10*3/uL (ref 150–400)
RBC: 2.69 MIL/uL — ABNORMAL LOW (ref 4.22–5.81)
RDW: 16.9 % — ABNORMAL HIGH (ref 11.5–15.5)
WBC: 5.5 10*3/uL (ref 4.0–10.5)
nRBC: 0 % (ref 0.0–0.2)

## 2021-05-23 LAB — BASIC METABOLIC PANEL
Anion gap: 8 (ref 5–15)
BUN: 55 mg/dL — ABNORMAL HIGH (ref 8–23)
CO2: 21 mmol/L — ABNORMAL LOW (ref 22–32)
Calcium: 9.2 mg/dL (ref 8.9–10.3)
Chloride: 105 mmol/L (ref 98–111)
Creatinine, Ser: 4.32 mg/dL — ABNORMAL HIGH (ref 0.61–1.24)
GFR, Estimated: 15 mL/min — ABNORMAL LOW (ref >60)
Glucose, Bld: 109 mg/dL — ABNORMAL HIGH (ref 70–99)
Potassium: 5 mmol/L (ref 3.5–5.1)
Sodium: 134 mmol/L — ABNORMAL LOW (ref 135–145)

## 2021-05-23 LAB — URINALYSIS, COMPLETE (UACMP) WITH MICROSCOPIC
Bacteria, UA: NONE SEEN
Bilirubin Urine: NEGATIVE
Glucose, UA: NEGATIVE mg/dL
Ketones, ur: NEGATIVE mg/dL
Leukocytes,Ua: NEGATIVE
Nitrite: NEGATIVE
Protein, ur: 30 mg/dL — AB
Specific Gravity, Urine: 1.011 (ref 1.005–1.030)
pH: 7 (ref 5.0–8.0)

## 2021-05-23 LAB — PROTEIN / CREATININE RATIO, URINE
Creatinine, Urine: 112.77 mg/dL
Protein Creatinine Ratio: 0.31 mg/mg{Cre} — ABNORMAL HIGH (ref 0.00–0.15)
Total Protein, Urine: 35 mg/dL

## 2021-05-24 DIAGNOSIS — Y830 Surgical operation with transplant of whole organ as the cause of abnormal reaction of the patient, or of later complication, without mention of misadventure at the time of the procedure: Secondary | ICD-10-CM | POA: Diagnosis not present

## 2021-05-24 DIAGNOSIS — Z79899 Other long term (current) drug therapy: Secondary | ICD-10-CM | POA: Diagnosis not present

## 2021-05-24 DIAGNOSIS — Z4822 Encounter for aftercare following kidney transplant: Secondary | ICD-10-CM | POA: Diagnosis not present

## 2021-05-24 DIAGNOSIS — T8619 Other complication of kidney transplant: Secondary | ICD-10-CM | POA: Diagnosis not present

## 2021-05-25 ENCOUNTER — Other Ambulatory Visit: Payer: Self-pay

## 2021-05-25 ENCOUNTER — Other Ambulatory Visit (HOSPITAL_COMMUNITY)
Admission: RE | Admit: 2021-05-25 | Discharge: 2021-05-25 | Disposition: A | Discharge status: Home / Self Care | Payer: BC Managed Care – PPO | Source: Ambulatory Visit | Attending: Nephrology | Admitting: Nephrology

## 2021-05-25 DIAGNOSIS — Z940 Kidney transplant status: Secondary | ICD-10-CM | POA: Insufficient documentation

## 2021-05-25 DIAGNOSIS — Z5181 Encounter for therapeutic drug level monitoring: Secondary | ICD-10-CM | POA: Diagnosis not present

## 2021-05-25 DIAGNOSIS — Z79899 Other long term (current) drug therapy: Secondary | ICD-10-CM | POA: Diagnosis not present

## 2021-05-25 LAB — RENAL FUNCTION PANEL
Albumin: 4.1 g/dL (ref 3.5–5.0)
Anion gap: 8 (ref 5–15)
BUN: 60 mg/dL — ABNORMAL HIGH (ref 8–23)
CO2: 18 mmol/L — ABNORMAL LOW (ref 22–32)
Calcium: 9.4 mg/dL (ref 8.9–10.3)
Chloride: 111 mmol/L (ref 98–111)
Creatinine, Ser: 4.27 mg/dL — ABNORMAL HIGH (ref 0.61–1.24)
GFR, Estimated: 15 mL/min — ABNORMAL LOW (ref >60)
Glucose, Bld: 134 mg/dL — ABNORMAL HIGH (ref 70–99)
Phosphorus: 2.8 mg/dL (ref 2.5–4.6)
Potassium: 5.5 mmol/L — ABNORMAL HIGH (ref 3.5–5.1)
Sodium: 137 mmol/L (ref 135–145)

## 2021-05-25 LAB — MISC LABCORP TEST (SEND OUT): Labcorp test code: 138962

## 2021-05-25 LAB — CMV DNA, QUANTITATIVE, PCR
CMV DNA Quant: NEGATIVE IU/mL
Log10 CMV Qn DNA Pl: UNDETERMINED log10 IU/mL

## 2021-05-25 LAB — TACROLIMUS LEVEL: Tacrolimus (FK506) - LabCorp: 8.3 ng/mL (ref 2.0–20.0)

## 2021-05-27 ENCOUNTER — Other Ambulatory Visit: Payer: Self-pay

## 2021-05-27 ENCOUNTER — Encounter (HOSPITAL_COMMUNITY)
Admission: RE | Admit: 2021-05-27 | Discharge: 2021-05-27 | Disposition: A | Discharge status: Home / Self Care | Payer: BC Managed Care – PPO | Source: Ambulatory Visit | Attending: Physician Assistant | Admitting: Physician Assistant

## 2021-05-27 DIAGNOSIS — Z5181 Encounter for therapeutic drug level monitoring: Secondary | ICD-10-CM | POA: Insufficient documentation

## 2021-05-27 DIAGNOSIS — Z79899 Other long term (current) drug therapy: Secondary | ICD-10-CM | POA: Diagnosis not present

## 2021-05-27 DIAGNOSIS — Z940 Kidney transplant status: Secondary | ICD-10-CM | POA: Insufficient documentation

## 2021-05-27 LAB — RENAL FUNCTION PANEL
Albumin: 4.1 g/dL (ref 3.5–5.0)
Anion gap: 2 — ABNORMAL LOW (ref 5–15)
BUN: 62 mg/dL — ABNORMAL HIGH (ref 8–23)
CO2: 18 mmol/L — ABNORMAL LOW (ref 22–32)
Calcium: 9.1 mg/dL (ref 8.9–10.3)
Chloride: 114 mmol/L — ABNORMAL HIGH (ref 98–111)
Creatinine, Ser: 3.99 mg/dL — ABNORMAL HIGH (ref 0.61–1.24)
GFR, Estimated: 16 mL/min — ABNORMAL LOW (ref >60)
Glucose, Bld: 109 mg/dL — ABNORMAL HIGH (ref 70–99)
Phosphorus: 3 mg/dL (ref 2.5–4.6)
Potassium: 5.5 mmol/L — ABNORMAL HIGH (ref 3.5–5.1)
Sodium: 134 mmol/L — ABNORMAL LOW (ref 135–145)

## 2021-05-30 ENCOUNTER — Encounter (HOSPITAL_COMMUNITY)
Admission: RE | Admit: 2021-05-30 | Discharge: 2021-05-30 | Disposition: A | Discharge status: Home / Self Care | Payer: BC Managed Care – PPO | Source: Ambulatory Visit | Attending: *Deleted | Admitting: *Deleted

## 2021-05-30 DIAGNOSIS — Z79899 Other long term (current) drug therapy: Secondary | ICD-10-CM | POA: Diagnosis not present

## 2021-05-30 DIAGNOSIS — Z5181 Encounter for therapeutic drug level monitoring: Secondary | ICD-10-CM | POA: Diagnosis not present

## 2021-05-30 DIAGNOSIS — Z940 Kidney transplant status: Secondary | ICD-10-CM | POA: Diagnosis not present

## 2021-05-30 LAB — RENAL FUNCTION PANEL
Albumin: 4.3 g/dL (ref 3.5–5.0)
Anion gap: 5 (ref 5–15)
BUN: 72 mg/dL — ABNORMAL HIGH (ref 8–23)
CO2: 15 mmol/L — ABNORMAL LOW (ref 22–32)
Calcium: 9.2 mg/dL (ref 8.9–10.3)
Chloride: 114 mmol/L — ABNORMAL HIGH (ref 98–111)
Creatinine, Ser: 3.5 mg/dL — ABNORMAL HIGH (ref 0.61–1.24)
GFR, Estimated: 19 mL/min — ABNORMAL LOW (ref >60)
Glucose, Bld: 119 mg/dL — ABNORMAL HIGH (ref 70–99)
Phosphorus: 3.4 mg/dL (ref 2.5–4.6)
Potassium: 5.8 mmol/L — ABNORMAL HIGH (ref 3.5–5.1)
Sodium: 134 mmol/L — ABNORMAL LOW (ref 135–145)

## 2021-05-31 DIAGNOSIS — I10 Essential (primary) hypertension: Secondary | ICD-10-CM | POA: Diagnosis not present

## 2021-05-31 DIAGNOSIS — I158 Other secondary hypertension: Secondary | ICD-10-CM | POA: Diagnosis not present

## 2021-05-31 DIAGNOSIS — E875 Hyperkalemia: Secondary | ICD-10-CM | POA: Diagnosis not present

## 2021-05-31 DIAGNOSIS — D849 Immunodeficiency, unspecified: Secondary | ICD-10-CM | POA: Diagnosis not present

## 2021-05-31 DIAGNOSIS — Y831 Surgical operation with implant of artificial internal device as the cause of abnormal reaction of the patient, or of later complication, without mention of misadventure at the time of the procedure: Secondary | ICD-10-CM | POA: Diagnosis not present

## 2021-05-31 DIAGNOSIS — Z992 Dependence on renal dialysis: Secondary | ICD-10-CM | POA: Diagnosis not present

## 2021-05-31 DIAGNOSIS — Z940 Kidney transplant status: Secondary | ICD-10-CM | POA: Diagnosis not present

## 2021-05-31 DIAGNOSIS — N186 End stage renal disease: Secondary | ICD-10-CM | POA: Diagnosis not present

## 2021-05-31 DIAGNOSIS — T8619 Other complication of kidney transplant: Secondary | ICD-10-CM | POA: Diagnosis not present

## 2021-06-01 DIAGNOSIS — Z992 Dependence on renal dialysis: Secondary | ICD-10-CM | POA: Diagnosis not present

## 2021-06-01 DIAGNOSIS — N2581 Secondary hyperparathyroidism of renal origin: Secondary | ICD-10-CM | POA: Diagnosis not present

## 2021-06-01 DIAGNOSIS — N186 End stage renal disease: Secondary | ICD-10-CM | POA: Diagnosis not present

## 2021-06-03 ENCOUNTER — Other Ambulatory Visit: Payer: Self-pay

## 2021-06-03 ENCOUNTER — Encounter (HOSPITAL_COMMUNITY)
Admission: RE | Admit: 2021-06-03 | Discharge: 2021-06-03 | Disposition: A | Discharge status: Home / Self Care | Payer: BC Managed Care – PPO | Source: Ambulatory Visit | Attending: Nephrology | Admitting: Nephrology

## 2021-06-03 DIAGNOSIS — Z5181 Encounter for therapeutic drug level monitoring: Secondary | ICD-10-CM | POA: Diagnosis not present

## 2021-06-03 DIAGNOSIS — Z940 Kidney transplant status: Secondary | ICD-10-CM | POA: Insufficient documentation

## 2021-06-03 LAB — RENAL FUNCTION PANEL
Albumin: 4.1 g/dL (ref 3.5–5.0)
Anion gap: 9 (ref 5–15)
BUN: 50 mg/dL — ABNORMAL HIGH (ref 8–23)
CO2: 25 mmol/L (ref 22–32)
Calcium: 9.2 mg/dL (ref 8.9–10.3)
Chloride: 101 mmol/L (ref 98–111)
Creatinine, Ser: 3.08 mg/dL — ABNORMAL HIGH (ref 0.61–1.24)
GFR, Estimated: 22 mL/min — ABNORMAL LOW (ref >60)
Glucose, Bld: 123 mg/dL — ABNORMAL HIGH (ref 70–99)
Phosphorus: 2.8 mg/dL (ref 2.5–4.6)
Potassium: 3.9 mmol/L (ref 3.5–5.1)
Sodium: 135 mmol/L (ref 135–145)

## 2021-06-06 ENCOUNTER — Other Ambulatory Visit (HOSPITAL_COMMUNITY)
Admission: RE | Admit: 2021-06-06 | Discharge: 2021-06-06 | Disposition: A | Discharge status: Home / Self Care | Payer: BC Managed Care – PPO | Source: Ambulatory Visit | Attending: Nephrology | Admitting: Nephrology

## 2021-06-06 DIAGNOSIS — Z940 Kidney transplant status: Secondary | ICD-10-CM | POA: Diagnosis not present

## 2021-06-06 DIAGNOSIS — Z5181 Encounter for therapeutic drug level monitoring: Secondary | ICD-10-CM | POA: Insufficient documentation

## 2021-06-06 LAB — RENAL FUNCTION PANEL
Albumin: 3.9 g/dL (ref 3.5–5.0)
Anion gap: 9 (ref 5–15)
BUN: 65 mg/dL — ABNORMAL HIGH (ref 8–23)
CO2: 21 mmol/L — ABNORMAL LOW (ref 22–32)
Calcium: 9.1 mg/dL (ref 8.9–10.3)
Chloride: 109 mmol/L (ref 98–111)
Creatinine, Ser: 3.32 mg/dL — ABNORMAL HIGH (ref 0.61–1.24)
GFR, Estimated: 20 mL/min — ABNORMAL LOW (ref >60)
Glucose, Bld: 114 mg/dL — ABNORMAL HIGH (ref 70–99)
Phosphorus: 3.5 mg/dL (ref 2.5–4.6)
Potassium: 4.7 mmol/L (ref 3.5–5.1)
Sodium: 139 mmol/L (ref 135–145)

## 2021-06-08 ENCOUNTER — Other Ambulatory Visit: Payer: Self-pay

## 2021-06-08 ENCOUNTER — Encounter (HOSPITAL_COMMUNITY)
Admission: RE | Admit: 2021-06-08 | Discharge: 2021-06-08 | Disposition: A | Discharge status: Home / Self Care | Payer: BC Managed Care – PPO | Source: Ambulatory Visit | Attending: Nephrology | Admitting: Nephrology

## 2021-06-08 DIAGNOSIS — Z940 Kidney transplant status: Secondary | ICD-10-CM | POA: Diagnosis not present

## 2021-06-08 DIAGNOSIS — Z5181 Encounter for therapeutic drug level monitoring: Secondary | ICD-10-CM | POA: Diagnosis not present

## 2021-06-08 LAB — RENAL FUNCTION PANEL
Albumin: 3.9 g/dL (ref 3.5–5.0)
Anion gap: 8 (ref 5–15)
BUN: 67 mg/dL — ABNORMAL HIGH (ref 8–23)
CO2: 18 mmol/L — ABNORMAL LOW (ref 22–32)
Calcium: 9.1 mg/dL (ref 8.9–10.3)
Chloride: 110 mmol/L (ref 98–111)
Creatinine, Ser: 3.01 mg/dL — ABNORMAL HIGH (ref 0.61–1.24)
GFR, Estimated: 23 mL/min — ABNORMAL LOW (ref >60)
Glucose, Bld: 115 mg/dL — ABNORMAL HIGH (ref 70–99)
Phosphorus: 2.8 mg/dL (ref 2.5–4.6)
Potassium: 5 mmol/L (ref 3.5–5.1)
Sodium: 136 mmol/L (ref 135–145)

## 2021-06-10 ENCOUNTER — Encounter (HOSPITAL_COMMUNITY)
Admission: RE | Admit: 2021-06-10 | Discharge: 2021-06-10 | Disposition: A | Discharge status: Home / Self Care | Payer: BC Managed Care – PPO | Source: Ambulatory Visit | Attending: Nephrology | Admitting: Nephrology

## 2021-06-10 ENCOUNTER — Other Ambulatory Visit: Payer: Self-pay

## 2021-06-10 DIAGNOSIS — Z5181 Encounter for therapeutic drug level monitoring: Secondary | ICD-10-CM | POA: Diagnosis not present

## 2021-06-10 DIAGNOSIS — Z940 Kidney transplant status: Secondary | ICD-10-CM | POA: Diagnosis not present

## 2021-06-10 LAB — RENAL FUNCTION PANEL
Albumin: 4.1 g/dL (ref 3.5–5.0)
Anion gap: 7 (ref 5–15)
BUN: 68 mg/dL — ABNORMAL HIGH (ref 8–23)
CO2: 17 mmol/L — ABNORMAL LOW (ref 22–32)
Calcium: 9.2 mg/dL (ref 8.9–10.3)
Chloride: 114 mmol/L — ABNORMAL HIGH (ref 98–111)
Creatinine, Ser: 3.18 mg/dL — ABNORMAL HIGH (ref 0.61–1.24)
GFR, Estimated: 21 mL/min — ABNORMAL LOW (ref >60)
Glucose, Bld: 111 mg/dL — ABNORMAL HIGH (ref 70–99)
Phosphorus: 2.8 mg/dL (ref 2.5–4.6)
Potassium: 5.5 mmol/L — ABNORMAL HIGH (ref 3.5–5.1)
Sodium: 138 mmol/L (ref 135–145)

## 2021-06-13 ENCOUNTER — Other Ambulatory Visit (HOSPITAL_COMMUNITY)
Admission: RE | Admit: 2021-06-13 | Discharge: 2021-06-13 | Disposition: A | Discharge status: Home / Self Care | Payer: BC Managed Care – PPO | Source: Ambulatory Visit | Attending: Nephrology | Admitting: Nephrology

## 2021-06-13 DIAGNOSIS — Z940 Kidney transplant status: Secondary | ICD-10-CM | POA: Diagnosis not present

## 2021-06-13 DIAGNOSIS — Z5181 Encounter for therapeutic drug level monitoring: Secondary | ICD-10-CM | POA: Insufficient documentation

## 2021-06-13 LAB — RENAL FUNCTION PANEL
Albumin: 4 g/dL (ref 3.5–5.0)
Anion gap: 8 (ref 5–15)
BUN: 71 mg/dL — ABNORMAL HIGH (ref 8–23)
CO2: 17 mmol/L — ABNORMAL LOW (ref 22–32)
Calcium: 9.2 mg/dL (ref 8.9–10.3)
Chloride: 110 mmol/L (ref 98–111)
Creatinine, Ser: 2.96 mg/dL — ABNORMAL HIGH (ref 0.61–1.24)
GFR, Estimated: 23 mL/min — ABNORMAL LOW (ref >60)
Glucose, Bld: 105 mg/dL — ABNORMAL HIGH (ref 70–99)
Phosphorus: 3.3 mg/dL (ref 2.5–4.6)
Potassium: 5 mmol/L (ref 3.5–5.1)
Sodium: 135 mmol/L (ref 135–145)

## 2021-06-16 DIAGNOSIS — D849 Immunodeficiency, unspecified: Secondary | ICD-10-CM | POA: Diagnosis not present

## 2021-06-16 DIAGNOSIS — Z940 Kidney transplant status: Secondary | ICD-10-CM | POA: Diagnosis not present

## 2021-06-24 DIAGNOSIS — Z940 Kidney transplant status: Secondary | ICD-10-CM | POA: Diagnosis not present

## 2021-06-24 DIAGNOSIS — Z466 Encounter for fitting and adjustment of urinary device: Secondary | ICD-10-CM | POA: Diagnosis not present

## 2021-06-27 ENCOUNTER — Encounter (HOSPITAL_COMMUNITY)
Admission: RE | Admit: 2021-06-27 | Discharge: 2021-06-27 | Disposition: A | Discharge status: Home / Self Care | Payer: BC Managed Care – PPO | Source: Ambulatory Visit | Attending: Nephrology | Admitting: Nephrology

## 2021-06-27 DIAGNOSIS — Z940 Kidney transplant status: Secondary | ICD-10-CM | POA: Diagnosis not present

## 2021-06-27 DIAGNOSIS — Z5181 Encounter for therapeutic drug level monitoring: Secondary | ICD-10-CM | POA: Diagnosis not present

## 2021-06-27 LAB — RENAL FUNCTION PANEL
Albumin: 4.1 g/dL (ref 3.5–5.0)
Anion gap: 9 (ref 5–15)
BUN: 54 mg/dL — ABNORMAL HIGH (ref 8–23)
CO2: 16 mmol/L — ABNORMAL LOW (ref 22–32)
Calcium: 9.6 mg/dL (ref 8.9–10.3)
Chloride: 112 mmol/L — ABNORMAL HIGH (ref 98–111)
Creatinine, Ser: 2.4 mg/dL — ABNORMAL HIGH (ref 0.61–1.24)
GFR, Estimated: 30 mL/min — ABNORMAL LOW (ref >60)
Glucose, Bld: 117 mg/dL — ABNORMAL HIGH (ref 70–99)
Phosphorus: 3 mg/dL (ref 2.5–4.6)
Potassium: 4.3 mmol/L (ref 3.5–5.1)
Sodium: 137 mmol/L (ref 135–145)

## 2021-06-30 ENCOUNTER — Encounter (HOSPITAL_COMMUNITY)
Admission: RE | Admit: 2021-06-30 | Discharge: 2021-06-30 | Disposition: A | Discharge status: Home / Self Care | Payer: BC Managed Care – PPO | Source: Ambulatory Visit | Attending: Nephrology | Admitting: Nephrology

## 2021-06-30 ENCOUNTER — Other Ambulatory Visit (HOSPITAL_COMMUNITY): Admission: AD | Admit: 2021-06-30 | Payer: BC Managed Care – PPO | Source: Home / Self Care

## 2021-06-30 DIAGNOSIS — Z5181 Encounter for therapeutic drug level monitoring: Secondary | ICD-10-CM | POA: Insufficient documentation

## 2021-06-30 DIAGNOSIS — Z940 Kidney transplant status: Secondary | ICD-10-CM | POA: Insufficient documentation

## 2021-06-30 LAB — RENAL FUNCTION PANEL
Albumin: 4 g/dL (ref 3.5–5.0)
Anion gap: 8 (ref 5–15)
BUN: 55 mg/dL — ABNORMAL HIGH (ref 8–23)
CO2: 17 mmol/L — ABNORMAL LOW (ref 22–32)
Calcium: 9.2 mg/dL (ref 8.9–10.3)
Chloride: 112 mmol/L — ABNORMAL HIGH (ref 98–111)
Creatinine, Ser: 2.45 mg/dL — ABNORMAL HIGH (ref 0.61–1.24)
GFR, Estimated: 29 mL/min — ABNORMAL LOW (ref >60)
Glucose, Bld: 115 mg/dL — ABNORMAL HIGH (ref 70–99)
Phosphorus: 2.8 mg/dL (ref 2.5–4.6)
Potassium: 3.7 mmol/L (ref 3.5–5.1)
Sodium: 137 mmol/L (ref 135–145)

## 2021-06-30 LAB — URINALYSIS, COMPLETE (UACMP) WITH MICROSCOPIC
Bacteria, UA: NONE SEEN
Bilirubin Urine: NEGATIVE
Glucose, UA: NEGATIVE mg/dL
Hgb urine dipstick: NEGATIVE
Ketones, ur: NEGATIVE mg/dL
Leukocytes,Ua: NEGATIVE
Nitrite: NEGATIVE
Protein, ur: NEGATIVE mg/dL
Specific Gravity, Urine: 1.012 (ref 1.005–1.030)
pH: 6 (ref 5.0–8.0)

## 2021-06-30 LAB — PROTEIN / CREATININE RATIO, URINE
Creatinine, Urine: 89.2 mg/dL
Protein Creatinine Ratio: 0.24 mg/mg{Cre} — ABNORMAL HIGH (ref 0.00–0.15)
Total Protein, Urine: 21 mg/dL

## 2021-07-02 LAB — MISC LABCORP TEST (SEND OUT): Labcorp test code: 138962

## 2021-07-02 LAB — CMV DNA, QUANTITATIVE, PCR
CMV DNA Quant: NEGATIVE IU/mL
Log10 CMV Qn DNA Pl: UNDETERMINED log10 IU/mL

## 2021-07-04 LAB — TACROLIMUS LEVEL: Tacrolimus (FK506) - LabCorp: 6.8 ng/mL (ref 2.0–20.0)

## 2021-07-14 ENCOUNTER — Ambulatory Visit: Payer: BC Managed Care – PPO | Admitting: Physician Assistant

## 2021-07-18 ENCOUNTER — Other Ambulatory Visit (HOSPITAL_COMMUNITY)
Admission: RE | Admit: 2021-07-18 | Discharge: 2021-07-18 | Disposition: A | Discharge status: Home / Self Care | Payer: BC Managed Care – PPO | Source: Ambulatory Visit | Attending: Nephrology | Admitting: Nephrology

## 2021-07-18 DIAGNOSIS — Z940 Kidney transplant status: Secondary | ICD-10-CM | POA: Insufficient documentation

## 2021-07-18 LAB — RENAL FUNCTION PANEL
Albumin: 4.3 g/dL (ref 3.5–5.0)
Anion gap: 8 (ref 5–15)
BUN: 64 mg/dL — ABNORMAL HIGH (ref 8–23)
CO2: 15 mmol/L — ABNORMAL LOW (ref 22–32)
Calcium: 9.6 mg/dL (ref 8.9–10.3)
Chloride: 115 mmol/L — ABNORMAL HIGH (ref 98–111)
Creatinine, Ser: 2.92 mg/dL — ABNORMAL HIGH (ref 0.61–1.24)
GFR, Estimated: 24 mL/min — ABNORMAL LOW (ref >60)
Glucose, Bld: 118 mg/dL — ABNORMAL HIGH (ref 70–99)
Phosphorus: 3 mg/dL (ref 2.5–4.6)
Potassium: 6.2 mmol/L — ABNORMAL HIGH (ref 3.5–5.1)
Sodium: 138 mmol/L (ref 135–145)

## 2021-07-18 LAB — CBC WITH DIFFERENTIAL/PLATELET
Abs Immature Granulocytes: 0.44 10*3/uL — ABNORMAL HIGH (ref 0.00–0.07)
Basophils Absolute: 0.1 10*3/uL (ref 0.0–0.1)
Basophils Relative: 1 %
Eosinophils Absolute: 0.1 10*3/uL (ref 0.0–0.5)
Eosinophils Relative: 1 %
HCT: 28.2 % — ABNORMAL LOW (ref 39.0–52.0)
Hemoglobin: 8.4 g/dL — ABNORMAL LOW (ref 13.0–17.0)
Immature Granulocytes: 9 %
Lymphocytes Relative: 7 %
Lymphs Abs: 0.4 10*3/uL — ABNORMAL LOW (ref 0.7–4.0)
MCH: 30.5 pg (ref 26.0–34.0)
MCHC: 29.8 g/dL — ABNORMAL LOW (ref 30.0–36.0)
MCV: 102.5 fL — ABNORMAL HIGH (ref 80.0–100.0)
Monocytes Absolute: 0.4 10*3/uL (ref 0.1–1.0)
Monocytes Relative: 8 %
Neutro Abs: 3.6 10*3/uL (ref 1.7–7.7)
Neutrophils Relative %: 74 %
Platelets: 283 10*3/uL (ref 150–400)
RBC: 2.75 MIL/uL — ABNORMAL LOW (ref 4.22–5.81)
RDW: 17.2 % — ABNORMAL HIGH (ref 11.5–15.5)
WBC: 4.9 10*3/uL (ref 4.0–10.5)
nRBC: 0 % (ref 0.0–0.2)

## 2021-07-18 LAB — URINALYSIS, ROUTINE W REFLEX MICROSCOPIC
Bilirubin Urine: NEGATIVE
Glucose, UA: NEGATIVE mg/dL
Hgb urine dipstick: NEGATIVE
Ketones, ur: NEGATIVE mg/dL
Leukocytes,Ua: NEGATIVE
Nitrite: NEGATIVE
Protein, ur: NEGATIVE mg/dL
Specific Gravity, Urine: 1.017 (ref 1.005–1.030)
pH: 5 (ref 5.0–8.0)

## 2021-07-18 LAB — PROTEIN / CREATININE RATIO, URINE
Creatinine, Urine: 106.52 mg/dL
Protein Creatinine Ratio: 0.2 mg/mg{Cre} — ABNORMAL HIGH (ref 0.00–0.15)
Total Protein, Urine: 21 mg/dL

## 2021-07-19 LAB — TACROLIMUS LEVEL: Tacrolimus (FK506) - LabCorp: 10 ng/mL (ref 2.0–20.0)

## 2021-07-20 LAB — BK QUANT PCR (PLASMA/SERUM): BK Quantitaion PCR: NEGATIVE IU/mL

## 2021-07-21 LAB — CMV DNA, QUANTITATIVE, PCR
CMV DNA Quant: NEGATIVE IU/mL
Log10 CMV Qn DNA Pl: UNDETERMINED log10 IU/mL

## 2021-07-25 ENCOUNTER — Other Ambulatory Visit (HOSPITAL_COMMUNITY)
Admission: RE | Admit: 2021-07-25 | Discharge: 2021-07-25 | Disposition: A | Discharge status: Home / Self Care | Payer: BC Managed Care – PPO | Source: Ambulatory Visit | Attending: Nephrology | Admitting: Nephrology

## 2021-07-25 DIAGNOSIS — B259 Cytomegaloviral disease, unspecified: Secondary | ICD-10-CM | POA: Diagnosis not present

## 2021-07-25 DIAGNOSIS — B349 Viral infection, unspecified: Secondary | ICD-10-CM | POA: Insufficient documentation

## 2021-07-25 LAB — CBC WITH DIFFERENTIAL/PLATELET
Abs Immature Granulocytes: 0.35 10*3/uL — ABNORMAL HIGH (ref 0.00–0.07)
Basophils Absolute: 0.1 10*3/uL (ref 0.0–0.1)
Basophils Relative: 1 %
Eosinophils Absolute: 0.1 10*3/uL (ref 0.0–0.5)
Eosinophils Relative: 2 %
HCT: 26 % — ABNORMAL LOW (ref 39.0–52.0)
Hemoglobin: 7.8 g/dL — ABNORMAL LOW (ref 13.0–17.0)
Immature Granulocytes: 7 %
Lymphocytes Relative: 8 %
Lymphs Abs: 0.4 10*3/uL — ABNORMAL LOW (ref 0.7–4.0)
MCH: 30.7 pg (ref 26.0–34.0)
MCHC: 30 g/dL (ref 30.0–36.0)
MCV: 102.4 fL — ABNORMAL HIGH (ref 80.0–100.0)
Monocytes Absolute: 0.5 10*3/uL (ref 0.1–1.0)
Monocytes Relative: 10 %
Neutro Abs: 3.6 10*3/uL (ref 1.7–7.7)
Neutrophils Relative %: 72 %
Platelets: 267 10*3/uL (ref 150–400)
RBC: 2.54 MIL/uL — ABNORMAL LOW (ref 4.22–5.81)
RDW: 16.1 % — ABNORMAL HIGH (ref 11.5–15.5)
WBC: 5 10*3/uL (ref 4.0–10.5)
nRBC: 0 % (ref 0.0–0.2)

## 2021-07-25 LAB — RENAL FUNCTION PANEL
Albumin: 4 g/dL (ref 3.5–5.0)
Anion gap: 3 — ABNORMAL LOW (ref 5–15)
BUN: 57 mg/dL — ABNORMAL HIGH (ref 8–23)
CO2: 17 mmol/L — ABNORMAL LOW (ref 22–32)
Calcium: 9.1 mg/dL (ref 8.9–10.3)
Chloride: 120 mmol/L — ABNORMAL HIGH (ref 98–111)
Creatinine, Ser: 2.58 mg/dL — ABNORMAL HIGH (ref 0.61–1.24)
GFR, Estimated: 27 mL/min — ABNORMAL LOW (ref >60)
Glucose, Bld: 125 mg/dL — ABNORMAL HIGH (ref 70–99)
Phosphorus: 2.7 mg/dL (ref 2.5–4.6)
Potassium: 5.2 mmol/L — ABNORMAL HIGH (ref 3.5–5.1)
Sodium: 140 mmol/L (ref 135–145)

## 2021-07-25 LAB — URINALYSIS, COMPLETE (UACMP) WITH MICROSCOPIC
Bilirubin Urine: NEGATIVE
Glucose, UA: NEGATIVE mg/dL
Hgb urine dipstick: NEGATIVE
Ketones, ur: NEGATIVE mg/dL
Leukocytes,Ua: NEGATIVE
Nitrite: NEGATIVE
Protein, ur: NEGATIVE mg/dL
Specific Gravity, Urine: 1.016 (ref 1.005–1.030)
pH: 6 (ref 5.0–8.0)

## 2021-07-25 LAB — PROTEIN / CREATININE RATIO, URINE
Creatinine, Urine: 88.54 mg/dL
Protein Creatinine Ratio: 0.23 mg/mg{Cre} — ABNORMAL HIGH (ref 0.00–0.15)
Total Protein, Urine: 20 mg/dL

## 2021-07-27 LAB — MISC LABCORP TEST (SEND OUT): Labcorp test code: 138962

## 2021-07-28 LAB — TACROLIMUS LEVEL: Tacrolimus (FK506) - LabCorp: 6.5 ng/mL (ref 2.0–20.0)

## 2021-07-28 LAB — CMV DNA, QUANTITATIVE, PCR
CMV DNA Quant: NEGATIVE IU/mL
Log10 CMV Qn DNA Pl: UNDETERMINED log10 IU/mL

## 2021-08-04 DIAGNOSIS — Z940 Kidney transplant status: Secondary | ICD-10-CM | POA: Diagnosis not present

## 2021-08-04 DIAGNOSIS — E139 Other specified diabetes mellitus without complications: Secondary | ICD-10-CM | POA: Diagnosis not present

## 2021-08-04 DIAGNOSIS — Z792 Long term (current) use of antibiotics: Secondary | ICD-10-CM | POA: Diagnosis not present

## 2021-08-04 DIAGNOSIS — D508 Other iron deficiency anemias: Secondary | ICD-10-CM | POA: Diagnosis not present

## 2021-08-04 DIAGNOSIS — I10 Essential (primary) hypertension: Secondary | ICD-10-CM | POA: Diagnosis not present

## 2021-08-04 DIAGNOSIS — D849 Immunodeficiency, unspecified: Secondary | ICD-10-CM | POA: Diagnosis not present

## 2021-08-04 DIAGNOSIS — E875 Hyperkalemia: Secondary | ICD-10-CM | POA: Diagnosis not present

## 2021-08-04 DIAGNOSIS — Z4822 Encounter for aftercare following kidney transplant: Secondary | ICD-10-CM | POA: Diagnosis not present

## 2021-08-04 DIAGNOSIS — R040 Epistaxis: Secondary | ICD-10-CM | POA: Diagnosis not present

## 2021-08-19 ENCOUNTER — Encounter (HOSPITAL_COMMUNITY)
Admission: RE | Admit: 2021-08-19 | Discharge: 2021-08-19 | Disposition: A | Discharge status: Home / Self Care | Payer: BC Managed Care – PPO | Source: Ambulatory Visit | Attending: Nephrology | Admitting: Nephrology

## 2021-08-19 DIAGNOSIS — Z940 Kidney transplant status: Secondary | ICD-10-CM | POA: Insufficient documentation

## 2021-08-19 DIAGNOSIS — Z5181 Encounter for therapeutic drug level monitoring: Secondary | ICD-10-CM | POA: Insufficient documentation

## 2021-08-19 LAB — PROTEIN / CREATININE RATIO, URINE
Creatinine, Urine: 88.64 mg/dL
Protein Creatinine Ratio: 0.1 mg/mg{Cre} (ref 0.00–0.15)
Total Protein, Urine: 9 mg/dL

## 2021-08-19 LAB — CBC WITH DIFFERENTIAL/PLATELET
Band Neutrophils: 7 %
Basophils Absolute: 0 10*3/uL (ref 0.0–0.1)
Basophils Relative: 0 %
Eosinophils Absolute: 0.1 10*3/uL (ref 0.0–0.5)
Eosinophils Relative: 1 %
HCT: 29.4 % — ABNORMAL LOW (ref 39.0–52.0)
Hemoglobin: 8.6 g/dL — ABNORMAL LOW (ref 13.0–17.0)
Lymphocytes Relative: 11 %
Lymphs Abs: 0.6 10*3/uL — ABNORMAL LOW (ref 0.7–4.0)
MCH: 29.4 pg (ref 26.0–34.0)
MCHC: 29.3 g/dL — ABNORMAL LOW (ref 30.0–36.0)
MCV: 100.3 fL — ABNORMAL HIGH (ref 80.0–100.0)
Monocytes Absolute: 0.1 10*3/uL (ref 0.1–1.0)
Monocytes Relative: 2 %
Myelocytes: 1 %
Neutro Abs: 4.5 10*3/uL (ref 1.7–7.7)
Neutrophils Relative %: 78 %
Platelets: 248 10*3/uL (ref 150–400)
RBC: 2.93 MIL/uL — ABNORMAL LOW (ref 4.22–5.81)
RDW: 14.4 % (ref 11.5–15.5)
WBC Morphology: REACTIVE
WBC: 5.3 10*3/uL (ref 4.0–10.5)
nRBC: 0 % (ref 0.0–0.2)

## 2021-08-19 LAB — RENAL FUNCTION PANEL
Albumin: 4.2 g/dL (ref 3.5–5.0)
Anion gap: 6 (ref 5–15)
BUN: 85 mg/dL — ABNORMAL HIGH (ref 8–23)
CO2: 17 mmol/L — ABNORMAL LOW (ref 22–32)
Calcium: 9.8 mg/dL (ref 8.9–10.3)
Chloride: 116 mmol/L — ABNORMAL HIGH (ref 98–111)
Creatinine, Ser: 2.86 mg/dL — ABNORMAL HIGH (ref 0.61–1.24)
GFR, Estimated: 24 mL/min — ABNORMAL LOW (ref >60)
Glucose, Bld: 150 mg/dL — ABNORMAL HIGH (ref 70–99)
Phosphorus: 3.2 mg/dL (ref 2.5–4.6)
Potassium: 5.5 mmol/L — ABNORMAL HIGH (ref 3.5–5.1)
Sodium: 139 mmol/L (ref 135–145)

## 2021-08-22 LAB — CMV DNA, QUANTITATIVE, PCR
CMV DNA Quant: NEGATIVE IU/mL
Log10 CMV Qn DNA Pl: UNDETERMINED log10 IU/mL

## 2021-08-22 LAB — TACROLIMUS LEVEL: Tacrolimus (FK506) - LabCorp: 5.2 ng/mL (ref 2.0–20.0)

## 2021-08-23 LAB — BK QUANT PCR (PLASMA/SERUM): BK Quantitaion PCR: NEGATIVE IU/mL

## 2021-09-02 DIAGNOSIS — Z5181 Encounter for therapeutic drug level monitoring: Secondary | ICD-10-CM | POA: Diagnosis not present

## 2021-09-02 DIAGNOSIS — J3489 Other specified disorders of nose and nasal sinuses: Secondary | ICD-10-CM | POA: Diagnosis not present

## 2021-09-02 DIAGNOSIS — R040 Epistaxis: Secondary | ICD-10-CM | POA: Diagnosis not present

## 2021-09-02 DIAGNOSIS — Z940 Kidney transplant status: Secondary | ICD-10-CM | POA: Diagnosis not present

## 2021-09-02 DIAGNOSIS — R0981 Nasal congestion: Secondary | ICD-10-CM | POA: Diagnosis not present

## 2021-09-16 ENCOUNTER — Other Ambulatory Visit (HOSPITAL_COMMUNITY)
Admission: RE | Admit: 2021-09-16 | Discharge: 2021-09-16 | Disposition: A | Discharge status: Home / Self Care | Payer: BC Managed Care – PPO | Source: Ambulatory Visit | Attending: Nephrology | Admitting: Nephrology

## 2021-09-16 DIAGNOSIS — Z5181 Encounter for therapeutic drug level monitoring: Secondary | ICD-10-CM | POA: Diagnosis not present

## 2021-09-16 DIAGNOSIS — Z940 Kidney transplant status: Secondary | ICD-10-CM | POA: Insufficient documentation

## 2021-09-16 LAB — CBC WITH DIFFERENTIAL/PLATELET
Abs Immature Granulocytes: 0.18 10*3/uL — ABNORMAL HIGH (ref 0.00–0.07)
Basophils Absolute: 0.1 10*3/uL (ref 0.0–0.1)
Basophils Relative: 2 %
Eosinophils Absolute: 0.1 10*3/uL (ref 0.0–0.5)
Eosinophils Relative: 2 %
HCT: 30.8 % — ABNORMAL LOW (ref 39.0–52.0)
Hemoglobin: 9 g/dL — ABNORMAL LOW (ref 13.0–17.0)
Immature Granulocytes: 5 %
Lymphocytes Relative: 12 %
Lymphs Abs: 0.4 10*3/uL — ABNORMAL LOW (ref 0.7–4.0)
MCH: 29.3 pg (ref 26.0–34.0)
MCHC: 29.2 g/dL — ABNORMAL LOW (ref 30.0–36.0)
MCV: 100.3 fL — ABNORMAL HIGH (ref 80.0–100.0)
Monocytes Absolute: 0.3 10*3/uL (ref 0.1–1.0)
Monocytes Relative: 9 %
Neutro Abs: 2.3 10*3/uL (ref 1.7–7.7)
Neutrophils Relative %: 70 %
Platelets: 194 10*3/uL (ref 150–400)
RBC: 3.07 MIL/uL — ABNORMAL LOW (ref 4.22–5.81)
RDW: 14.1 % (ref 11.5–15.5)
WBC: 3.3 10*3/uL — ABNORMAL LOW (ref 4.0–10.5)
nRBC: 0 % (ref 0.0–0.2)

## 2021-09-16 LAB — PROTEIN / CREATININE RATIO, URINE
Creatinine, Urine: 94.77 mg/dL
Protein Creatinine Ratio: 0.11 mg/mg{Cre} (ref 0.00–0.15)
Total Protein, Urine: 10 mg/dL

## 2021-09-16 LAB — RENAL FUNCTION PANEL
Albumin: 4 g/dL (ref 3.5–5.0)
Anion gap: 6 (ref 5–15)
BUN: 77 mg/dL — ABNORMAL HIGH (ref 8–23)
CO2: 19 mmol/L — ABNORMAL LOW (ref 22–32)
Calcium: 9.6 mg/dL (ref 8.9–10.3)
Chloride: 115 mmol/L — ABNORMAL HIGH (ref 98–111)
Creatinine, Ser: 2.61 mg/dL — ABNORMAL HIGH (ref 0.61–1.24)
GFR, Estimated: 27 mL/min — ABNORMAL LOW (ref >60)
Glucose, Bld: 144 mg/dL — ABNORMAL HIGH (ref 70–99)
Phosphorus: 2.9 mg/dL (ref 2.5–4.6)
Potassium: 4.9 mmol/L (ref 3.5–5.1)
Sodium: 140 mmol/L (ref 135–145)

## 2021-09-16 LAB — URINALYSIS, ROUTINE W REFLEX MICROSCOPIC
Bilirubin Urine: NEGATIVE
Glucose, UA: NEGATIVE mg/dL
Hgb urine dipstick: NEGATIVE
Ketones, ur: NEGATIVE mg/dL
Leukocytes,Ua: NEGATIVE
Nitrite: NEGATIVE
Protein, ur: NEGATIVE mg/dL
Specific Gravity, Urine: 1.014 (ref 1.005–1.030)
pH: 5 (ref 5.0–8.0)

## 2021-09-18 LAB — TACROLIMUS LEVEL: Tacrolimus (FK506) - LabCorp: 11.4 ng/mL (ref 2.0–20.0)

## 2021-09-19 LAB — CMV DNA, QUANTITATIVE, PCR
CMV DNA Quant: NEGATIVE IU/mL
Log10 CMV Qn DNA Pl: UNDETERMINED log10 IU/mL

## 2021-09-19 LAB — BK QUANT PCR (PLASMA/SERUM): BK Quantitaion PCR: NEGATIVE IU/mL

## 2021-09-30 DIAGNOSIS — N189 Chronic kidney disease, unspecified: Secondary | ICD-10-CM | POA: Diagnosis not present

## 2021-09-30 DIAGNOSIS — I10 Essential (primary) hypertension: Secondary | ICD-10-CM | POA: Diagnosis not present

## 2021-09-30 DIAGNOSIS — R0981 Nasal congestion: Secondary | ICD-10-CM | POA: Diagnosis not present

## 2021-09-30 DIAGNOSIS — D849 Immunodeficiency, unspecified: Secondary | ICD-10-CM | POA: Diagnosis not present

## 2021-09-30 DIAGNOSIS — Z940 Kidney transplant status: Secondary | ICD-10-CM | POA: Diagnosis not present

## 2021-09-30 DIAGNOSIS — R040 Epistaxis: Secondary | ICD-10-CM | POA: Diagnosis not present

## 2021-09-30 DIAGNOSIS — Z792 Long term (current) use of antibiotics: Secondary | ICD-10-CM | POA: Diagnosis not present

## 2021-09-30 DIAGNOSIS — J3489 Other specified disorders of nose and nasal sinuses: Secondary | ICD-10-CM | POA: Diagnosis not present

## 2021-09-30 DIAGNOSIS — Z862 Personal history of diseases of the blood and blood-forming organs and certain disorders involving the immune mechanism: Secondary | ICD-10-CM | POA: Diagnosis not present

## 2021-10-03 ENCOUNTER — Other Ambulatory Visit (HOSPITAL_COMMUNITY)
Admission: RE | Admit: 2021-10-03 | Discharge: 2021-10-03 | Disposition: A | Discharge status: Home / Self Care | Payer: BC Managed Care – PPO | Source: Ambulatory Visit | Attending: Physician Assistant | Admitting: Physician Assistant

## 2021-10-03 DIAGNOSIS — Z940 Kidney transplant status: Secondary | ICD-10-CM | POA: Insufficient documentation

## 2021-10-03 LAB — BASIC METABOLIC PANEL
Anion gap: 3 — ABNORMAL LOW (ref 5–15)
BUN: 70 mg/dL — ABNORMAL HIGH (ref 8–23)
CO2: 19 mmol/L — ABNORMAL LOW (ref 22–32)
Calcium: 9.1 mg/dL (ref 8.9–10.3)
Chloride: 115 mmol/L — ABNORMAL HIGH (ref 98–111)
Creatinine, Ser: 2.76 mg/dL — ABNORMAL HIGH (ref 0.61–1.24)
GFR, Estimated: 25 mL/min — ABNORMAL LOW (ref >60)
Glucose, Bld: 133 mg/dL — ABNORMAL HIGH (ref 70–99)
Potassium: 4.5 mmol/L (ref 3.5–5.1)
Sodium: 137 mmol/L (ref 135–145)

## 2021-10-05 LAB — TACROLIMUS LEVEL: Tacrolimus (FK506) - LabCorp: 7.2 ng/mL (ref 2.0–20.0)

## 2021-10-24 DIAGNOSIS — Z5181 Encounter for therapeutic drug level monitoring: Secondary | ICD-10-CM | POA: Diagnosis not present

## 2021-10-24 DIAGNOSIS — Z940 Kidney transplant status: Secondary | ICD-10-CM | POA: Diagnosis not present

## 2021-11-02 ENCOUNTER — Ambulatory Visit (INDEPENDENT_AMBULATORY_CARE_PROVIDER_SITE_OTHER): Payer: BC Managed Care – PPO | Admitting: Orthopaedic Surgery

## 2021-11-02 DIAGNOSIS — M170 Bilateral primary osteoarthritis of knee: Secondary | ICD-10-CM

## 2021-11-02 DIAGNOSIS — M1712 Unilateral primary osteoarthritis, left knee: Secondary | ICD-10-CM | POA: Diagnosis not present

## 2021-11-02 DIAGNOSIS — M1711 Unilateral primary osteoarthritis, right knee: Secondary | ICD-10-CM | POA: Diagnosis not present

## 2021-11-02 MED ORDER — BUPIVACAINE HCL 0.25 % IJ SOLN
0.6600 mL | INTRAMUSCULAR | Status: AC | PRN
  Administered 2021-11-02: .66 mL via INTRA_ARTICULAR

## 2021-11-02 MED ORDER — LIDOCAINE HCL 1 % IJ SOLN
3.0000 mL | INTRAMUSCULAR | Status: AC | PRN
  Administered 2021-11-02: 3 mL

## 2021-11-02 MED ORDER — METHYLPREDNISOLONE ACETATE 40 MG/ML IJ SUSP
13.3300 mg | INTRAMUSCULAR | Status: AC | PRN
  Administered 2021-11-02: 13.33 mg via INTRA_ARTICULAR

## 2021-11-02 NOTE — Progress Notes (Signed)
Office Visit Note   Patient: Dylan [name]           Date of Birth: August 16, 1960           MRN: [id number] Visit Date: 11/02/2021              Requested by: [name], [address],  [place] [address] PCP: [name], FNP   Assessment & Plan: Visit Diagnoses:  1. Bilateral primary osteoarthritis of knee     Plan: Impression is bilateral knee osteoarthritis.  Today, we discussed repeat cortisone injections to both knees as he had about 1 years relief following the last injections.  He is agreeable to this plan.  He will follow-up with [place] as needed.  Follow-Up Instructions: Return if symptoms worsen or fail to improve.   Orders:  Orders Placed This Encounter  Procedures   Large Joint Inj: bilateral knee   No orders of the defined types were placed in this encounter.     Procedures: Large Joint Inj: bilateral knee on 11/02/2021 3:52 PM Indications: pain Details: 22 G needle, anterolateral approach Medications (Right): 0.66 mL bupivacaine 0.25 %; 3 mL lidocaine 1 %; 13.33 mg methylPREDNISolone acetate 40 MG/ML Medications (Left): 0.66 mL bupivacaine 0.25 %; 3 mL lidocaine 1 %; 13.33 mg methylPREDNISolone acetate 40 MG/ML      Clinical Data: No additional findings.   Subjective: Chief Complaint  Patient presents with   Left Knee - Pain   Right Knee - Pain    HPI patient is a pleasant 61 year old Spanish-speaking gentleman is here today with interpreter.  He is here with recurrent bilateral knee pain.  History of underlying osteoarthritis to both knees.  He was seen in our office in July of last year where both knees were injected with cortisone.  He had significant relief up until recently.  The pain he has is primarily to the anterior aspect of the knees.  Pain is worse with activity.  He does not take anything for pain.  He is requesting repeat injections today.  Review of Systems as detailed in HPI.  All others reviewed and are  negative.   Objective: Vital Signs: There were no vitals taken for this visit.  Physical Exam well-developed well-nourished gentleman in no acute distress.  Alert and oriented x3.  Ortho Exam bilateral knee exam: Trace effusion on the left, no effusion on the right.  Range of motion 0 to 120 degrees.  No joint line tenderness.  Mild patellofemoral crepitus.  He is neurovascular intact distally.  Specialty Comments:  No specialty comments available.  Imaging: No new imaging   PMFS History: Patient Active Problem List   Diagnosis Date Noted   Seizure ([place]) 09/18/2019   Encounter for screening colonoscopy 04/17/2019   Screening for viral disease 04/17/2019   ESRD on hemodialysis ([place]) 03/15/2019   Primary osteoarthritis of right knee 01/14/2019   Primary osteoarthritis of left knee 01/14/2019   Effusion, right knee 01/14/2019   LVH (left ventricular hypertrophy) 12/27/2018   Aftercare including intermittent dialysis ([place]) 08/28/2018   Acidosis 08/28/2018   Anemia in chronic kidney disease 08/28/2018   Arteriovenous fistula, acquired ([place]) 08/28/2018   Chronic pain of left ankle 07/04/2018   Bilateral chronic knee pain 07/04/2018   Glucosuria 07/04/2018   Hypertensive nephropathy 06/11/2018   Hyperkalemia, diminished renal excretion 03/29/2016   ESRD (end stage renal disease) ([place]) 03/29/2016   Uremia of renal origin 03/29/2016   HTN (hypertension) 14/43/1540   Metabolic  acidosis, NAG, 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   History of anemia due to CKD 03/29/2016   Past Medical History:  Diagnosis Date   Anemia    Bilateral chronic knee pain 07/04/2018   Chronic pain of left ankle 07/04/2018   ESRD (end stage renal disease) ([place]) 03/29/2016   Glucosuria 07/04/2018   History of anemia due to CKD 03/29/2016   Hyperkalemia, diminished renal excretion 03/29/2016   Hypertension    Hypertensive nephropathy 6/80/3212   Metabolic acidosis, NAG, failure of bicarbonate  regeneration 03/29/2016   Renal disorder    Uremia of renal origin 03/29/2016    Family History  Problem Relation Age of Onset   Breast cancer Mother    Prostate cancer Father    Diabetes Brother    Breast cancer Sister    Colon cancer Neg Hx    Esophageal cancer Neg Hx    Inflammatory bowel disease Neg Hx    Liver disease Neg Hx    Pancreatic cancer Neg Hx    Rectal cancer Neg Hx    Stomach cancer Neg Hx     Past Surgical History:  Procedure Laterality Date   BASCILIC VEIN TRANSPOSITION Left 04/06/2016   Procedure: Left Arm Single Stage Bascilic Vein Transposition;  Surgeon: Waynetta Sandy, MD;  Location: [place];  Service: Vascular;  Laterality: Left;   INSERTION OF DIALYSIS CATHETER Right 04/06/2016   Procedure: INSERTION OF Tunneled DIALYSIS CATHETER RIGHT INTERNAL JUGULAR;  Surgeon: Waynetta Sandy, MD;  Location: [facility];  Service: Vascular;  Laterality: Right;   IR GENERIC HISTORICAL  06/26/2016   IR REMOVAL TUN CV CATH W/O FL 06/26/2016 Arne Cleveland, MD [facility]   NEPHRECTOMY TRANSPLANTED ORGAN     TOOTH EXTRACTION  2020   Social History   Occupational History   Occupation: [employer]  Tobacco Use   Smoking status: Never   Smokeless tobacco: Never  Vaping Use   Vaping Use: Never used  Substance and Sexual Activity   Alcohol use: No   Drug use: No   Sexual activity: Yes    Birth control/protection: None

## 2021-11-10 DIAGNOSIS — Z940 Kidney transplant status: Secondary | ICD-10-CM | POA: Diagnosis not present

## 2021-11-10 DIAGNOSIS — Z5181 Encounter for therapeutic drug level monitoring: Secondary | ICD-10-CM | POA: Diagnosis not present

## 2021-11-28 ENCOUNTER — Ambulatory Visit (INDEPENDENT_AMBULATORY_CARE_PROVIDER_SITE_OTHER): Payer: BC Managed Care – PPO | Admitting: Nurse Practitioner

## 2021-11-28 ENCOUNTER — Encounter: Payer: Self-pay | Admitting: Nurse Practitioner

## 2021-11-28 VITALS — BP 136/78 | HR 75 | Temp 98.4°F | Ht 66.0 in | Wt 188.8 lb

## 2021-11-28 DIAGNOSIS — R5383 Other fatigue: Secondary | ICD-10-CM | POA: Diagnosis not present

## 2021-11-28 DIAGNOSIS — Z940 Kidney transplant status: Secondary | ICD-10-CM

## 2021-11-28 DIAGNOSIS — R238 Other skin changes: Secondary | ICD-10-CM | POA: Diagnosis not present

## 2021-11-28 DIAGNOSIS — Z125 Encounter for screening for malignant neoplasm of prostate: Secondary | ICD-10-CM

## 2021-11-28 DIAGNOSIS — J3489 Other specified disorders of nose and nasal sinuses: Secondary | ICD-10-CM

## 2021-11-28 DIAGNOSIS — N186 End stage renal disease: Secondary | ICD-10-CM

## 2021-11-28 DIAGNOSIS — I120 Hypertensive chronic kidney disease with stage 5 chronic kidney disease or end stage renal disease: Secondary | ICD-10-CM | POA: Diagnosis not present

## 2021-11-28 DIAGNOSIS — Z0000 Encounter for general adult medical examination without abnormal findings: Secondary | ICD-10-CM

## 2021-11-28 DIAGNOSIS — Z79899 Other long term (current) drug therapy: Secondary | ICD-10-CM

## 2021-11-28 DIAGNOSIS — R051 Acute cough: Secondary | ICD-10-CM

## 2021-11-28 LAB — POCT URINALYSIS DIPSTICK
Bilirubin, UA: NEGATIVE
Blood, UA: NEGATIVE
Glucose, UA: NEGATIVE
Ketones, UA: NEGATIVE
Nitrite, UA: NEGATIVE
Protein, UA: NEGATIVE
Spec Grav, UA: 1.025 (ref 1.010–1.025)
Urobilinogen, UA: 0.2 E.U./dL
pH, UA: 5 (ref 5.0–8.0)

## 2021-11-28 MED ORDER — ACYCLOVIR 5 % EX OINT: TOPICAL_OINTMENT | CUTANEOUS | 2 refills | Status: AC

## 2021-11-28 NOTE — Progress Notes (Unsigned)
Barnet Glasgow Martin,acting as a Education administrator for [name], FNP.,have documented all relevant documentation on the behalf of [name], FNP,as directed by  [name], FNP while in the presence of [name], [place].   Subjective:     Patient ID: Dylan [name] , male    DOB: Sep 14, 1960 , 61 y.o.   MRN: [id number]   Chief Complaint  Patient presents with   Annual Exam    HPI  Patient presents today for a physical. Patient is complaint with medication with medication. He has had a kidney transplant the first week in December 2022, overall doing well. Had done at [facility], follows up every 1-1.5 months.  Patient presents with his wife Festus Holts and Tourist information centre manager.   Patient has stress rash on the back of his leg. Patient states he is having flu like symptoms for 3 weeks. He was given a medication from the Nephologist to help dry secretions. Has been having green phlegm.  Patient also needs a work note. 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   Renal disorder    Uremia of renal origin 03/29/2016     Family History  Problem Relation Age of Onset   Breast cancer Mother    Prostate cancer Father    Diabetes Brother    Breast cancer Sister    Colon cancer Neg Hx    Esophageal cancer Neg Hx    Inflammatory bowel disease Neg Hx    Liver disease Neg Hx    Pancreatic cancer Neg Hx    Rectal cancer Neg Hx    Stomach cancer Neg Hx      Current Outpatient Medications:     acyclovir ointment (ZOVIRAX) 5 %, Apply thin layer to affected area, Disp: 15 g, Rfl: 2   calcitRIOL (ROCALTROL) 0.25 MCG capsule, Take 1 capsule (0.25 mcg total) by mouth daily., Disp: 1 capsule, Rfl: 0   amLODipine (NORVASC) 5 MG tablet, Take by mouth., Disp: , Rfl:    cinacalcet (SENSIPAR) 60 MG tablet, Take 1 tablet by mouth daily., Disp: , Rfl:    FOSRENOL 500 MG chewable tablet, Chew 500 mg by mouth 5 (five) times daily., Disp: , Rfl:    methocarbamol (ROBAXIN) 500 MG tablet, Take 1 tablet (500 mg total) by mouth 2 (two) times daily., Disp: 20 tablet, Rfl: 0   multivitamin (RENA-VIT) TABS tablet, Take 1 tablet by mouth at bedtime., Disp: 30 tablet, Rfl: 0   oxyCODONE-acetaminophen (PERCOCET/ROXICET) 5-325 MG tablet, Take 1 tablet by mouth every 6 (six) hours as needed for severe pain. (Patient not taking: Reported on 11/24/2020), Disp: 15 tablet, Rfl: 0   predniSONE (STERAPRED UNI-PAK 21 TAB) 5 MG (21) TBPK tablet, Take as directed if ok with nephrology (Patient not taking: Reported on 11/24/2020), Disp: 21 tablet, Rfl: 0   sevelamer carbonate (RENVELA) 800 MG tablet, Take by mouth., Disp: , Rfl:    sucroferric oxyhydroxide (VELPHORO) 500 MG chewable tablet, Chew 1  tablet (500 mg total) by mouth 3 (three) times daily with meals., Disp: 1 tablet, Rfl: 0   No Known Allergies   Men's preventive visit. Patient Health Questionnaire (PHQ-2) is  [place] Office Visit from 11/28/2021 in [facility]  PHQ-2 Total Score 0      Patient is on a low potasium diet. Exercising - walks every day at work. He works in a [employer]. When he gets dropped off there is a long walk at work. Marital status: Married. Relevant history for alcohol use is:  Social History   Substance and Sexual Activity  Alcohol Use No   Relevant history for tobacco use is:  Social History   Tobacco Use  Smoking Status Never  Smokeless Tobacco Never  .   Review of Systems  Constitutional:  Negative.   HENT:  Positive for rhinorrhea. Negative for sinus pressure and sore throat.   Eyes: Negative.   Respiratory: Negative.    Cardiovascular: Negative.   Gastrointestinal: Negative.   Endocrine: Negative.   Genitourinary: Negative.   Musculoskeletal: Negative.   Skin:  Positive for rash (back of leg - will use cream and alcohol with the itching).  Allergic/Immunologic: Negative.   Neurological: Negative.   Hematological: Negative.   Psychiatric/Behavioral: Negative.       Today's Vitals   11/28/21 1414  BP: 136/78  Pulse: 75  Temp: 98.4 F (36.9 C)  TempSrc: Oral  Weight: 188 lb 12.8 oz (85.6 kg)  Height: '5\' 6"'$  (1.676 m)  PainSc: 0-No pain   Body mass index is 30.47 kg/m.  Wt Readings from Last 3 Encounters:  11/28/21 188 lb 12.8 oz (85.6 kg)  05/15/21 194 lb 0.1 oz (88 kg)  05/05/21 194 lb 0.1 oz (88 kg)    Objective:  Physical Exam Vitals reviewed.  Constitutional:      General: He is not in acute distress.    Appearance: Normal appearance. He is obese.  HENT:     Head: Normocephalic and atraumatic.     Right Ear: Tympanic membrane, ear canal and external ear normal. There is no impacted cerumen.     Left Ear: Tympanic membrane, ear canal and external ear normal. There is no impacted cerumen.     Nose: Nose normal.     Mouth/Throat:     Mouth: Mucous membranes are moist.  Eyes:     Pupils: Pupils are equal, round, and reactive to light.  Cardiovascular:     Rate and Rhythm: Normal rate and regular rhythm.     Pulses: Normal pulses.     Heart sounds: Normal heart sounds. No murmur heard. Pulmonary:     Effort: Pulmonary effort is normal. No respiratory distress.     Breath sounds: Normal breath sounds.  Abdominal:     General: Abdomen is flat. Bowel sounds are normal. There is no distension.     Palpations: Abdomen is soft.     Tenderness: There is no abdominal tenderness.  Genitourinary:    Prostate: Normal.     Rectum: Guaiac result negative.   Musculoskeletal:        General: No swelling or tenderness. Normal range of motion.     Cervical back: Normal range of motion and neck supple.  Skin:    General: Skin is warm.     Capillary Refill: Capillary refill takes less than 2 seconds.     Findings: Rash (vesicular rash) present.     Comments: Has brown raised nevus to left cheek  and left forearm.  Neurological:     General: No focal deficit present.     Mental Status: He is alert and oriented to person, place, and time.     Cranial Nerves: No cranial nerve deficit.     Motor: No weakness.  Psychiatric:        Mood and Affect: Mood normal.        Behavior: Behavior normal.        Thought Content: Thought content normal.        Judgment: Judgment normal.         Assessment And Plan:    1. Annual physical exam Behavior modifications discussed and diet history reviewed.   Pt will continue to exercise regularly and modify diet with low GI, plant based foods and decrease intake of processed foods.  Recommend intake of daily multivitamin, Vitamin D, and calcium.  Recommend colonoscopy for preventive screenings, as well as recommend immunizations that include influenza, TDAP, and Shingles - POCT Urinalysis Dipstick (81002) - Microalbumin / Creatinine Urine Ratio  2. Encounter for prostate cancer screening - PSA  3. Benign hypertension with ESRD (end-stage renal disease) ([place]) Comments: Blood pressure is controlled, continue current medications  4. S/P kidney transplant Overall doing well, continue f/u with transplant team  5. Other long term (current) drug therapy  6. Vesicular rash Comments: Has rash to his posterior leg area, will treat with an ointment - acyclovir ointment (ZOVIRAX) 5 %; Apply thin layer to affected area  Dispense: 15 g; Refill: 2  7. Sinus drainage Comments: May be related to his antirejection medications.  - CBC  8. Acute cough  9. Other fatigue Comments: Will check thyroid levels. May also  be related to deconditioning.  - TSH     Patient was given opportunity to ask questions. Patient verbalized understanding of the plan and was able to repeat key elements of the plan. All questions were answered to their satisfaction.   [name], FNP   I, [name], FNP, have reviewed all documentation for this visit. The documentation on 12/14/21 for the exam, diagnosis, procedures, and orders are all accurate and complete.  THE PATIENT IS ENCOURAGED TO PRACTICE SOCIAL DISTANCING DUE TO THE COVID-19 PANDEMIC.

## 2021-11-28 NOTE — Patient Instructions (Signed)
Health Maintenance, Male Adopting a healthy lifestyle and getting preventive care are important in promoting health and wellness. Ask your health care provider about: The right schedule for you to have regular tests and exams. Things you can do on your own to prevent diseases and keep yourself healthy. What should I know about diet, weight, and exercise? Eat a healthy diet  Eat a diet that includes plenty of vegetables, fruits, low-fat dairy products, and lean protein. Do not eat a lot of foods that are high in solid fats, added sugars, or sodium. Maintain a healthy weight Body mass index (BMI) is a measurement that can be used to identify possible weight problems. It estimates body fat based on height and weight. Your health care provider can help determine your BMI and help you achieve or maintain a healthy weight. Get regular exercise Get regular exercise. This is one of the most important things you can do for your health. Most adults should: Exercise for at least 150 minutes each week. The exercise should increase your heart rate and make you sweat (moderate-intensity exercise). Do strengthening exercises at least twice a week. This is in addition to the moderate-intensity exercise. Spend less time sitting. Even light physical activity can be beneficial. Watch cholesterol and blood lipids Have your blood tested for lipids and cholesterol at 62 years of age, then have this test every 5 years. You may need to have your cholesterol levels checked more often if: Your lipid or cholesterol levels are high. You are older than 62 years of age. You are at high risk for heart disease. What should I know about cancer screening? Many types of cancers can be detected early and may often be prevented. Depending on your health history and family history, you may need to have cancer screening at various ages. This may include screening for: Colorectal cancer. Prostate cancer. Skin cancer. Lung  cancer. What should I know about heart disease, diabetes, and high blood pressure? Blood pressure and heart disease High blood pressure causes heart disease and increases the risk of stroke. This is more likely to develop in people who have high blood pressure readings or are overweight. Talk with your health care provider about your target blood pressure readings. Have your blood pressure checked: Every 3-5 years if you are 18-39 years of age. Every year if you are 40 years old or older. If you are between the ages of 65 and 75 and are a current or former smoker, ask your health care provider if you should have a one-time screening for abdominal aortic aneurysm (AAA). Diabetes Have regular diabetes screenings. This checks your fasting blood sugar level. Have the screening done: Once every three years after age 45 if you are at a normal weight and have a low risk for diabetes. More often and at a younger age if you are overweight or have a high risk for diabetes. What should I know about preventing infection? Hepatitis B If you have a higher risk for hepatitis B, you should be screened for this virus. Talk with your health care provider to find out if you are at risk for hepatitis B infection. Hepatitis C Blood testing is recommended for: Everyone born from 1945 through 1965. Anyone with known risk factors for hepatitis C. Sexually transmitted infections (STIs) You should be screened each year for STIs, including gonorrhea and chlamydia, if: You are sexually active and are younger than 62 years of age. You are older than 62 years of age and your   health care provider tells you that you are at risk for this type of infection. Your sexual activity has changed since you were last screened, and you are at increased risk for chlamydia or gonorrhea. Ask your health care provider if you are at risk. Ask your health care provider about whether you are at high risk for HIV. Your health care provider  may recommend a prescription medicine to help prevent HIV infection. If you choose to take medicine to prevent HIV, you should first get tested for HIV. You should then be tested every 3 months for as long as you are taking the medicine. Follow these instructions at home: Alcohol use Do not drink alcohol if your health care provider tells you not to drink. If you drink alcohol: Limit how much you have to 0-2 drinks a day. Know how much alcohol is in your drink. In the [place] one drink equals one 12 oz bottle of beer (355 mL), one 5 oz glass of wine (148 mL), or one 1 oz glass of hard liquor (44 mL). Lifestyle Do not use any products that contain nicotine or tobacco. These products include cigarettes, chewing tobacco, and vaping devices, such as e-cigarettes. If you need help quitting, ask your health care provider. Do not use street drugs. Do not share needles. Ask your health care provider for help if you need support or information about quitting drugs. General instructions Schedule regular health, dental, and eye exams. Stay current with your vaccines. Tell your health care provider if: You often feel depressed. You have ever been abused or do not feel safe at home. Summary Adopting a healthy lifestyle and getting preventive care are important in promoting health and wellness. Follow your health care provider's instructions about healthy diet, exercising, and getting tested or screened for diseases. Follow your health care provider's instructions on monitoring your cholesterol and blood pressure. This information is not intended to replace advice given to you by your health care provider. Make sure you discuss any questions you have with your health care provider. Document Revised: 09/06/2020 Document Reviewed: 09/06/2020 Elsevier Patient Education  2023 Elsevier Inc.  

## 2021-11-29 ENCOUNTER — Encounter: Payer: Self-pay | Admitting: Nurse Practitioner

## 2021-11-29 LAB — CBC
Hematocrit: 31.3 % — ABNORMAL LOW (ref 37.5–51.0)
Hemoglobin: 9.8 g/dL — ABNORMAL LOW (ref 13.0–17.7)
MCH: 28.7 pg (ref 26.6–33.0)
MCHC: 31.3 g/dL — ABNORMAL LOW (ref 31.5–35.7)
MCV: 92 fL (ref 79–97)
Platelets: 342 10*3/uL (ref 150–450)
RBC: 3.41 x10E6/uL — ABNORMAL LOW (ref 4.14–5.80)
RDW: 14.3 % (ref 11.6–15.4)
WBC: 6.3 10*3/uL (ref 3.4–10.8)

## 2021-11-29 LAB — MICROALBUMIN / CREATININE URINE RATIO
Creatinine, Urine: 121.7 mg/dL
Microalb/Creat Ratio: 10 mg/g creat (ref 0–29)
Microalbumin, Urine: 12.3 ug/mL

## 2021-11-29 LAB — TSH: TSH: 0.996 u[IU]/mL (ref 0.450–4.500)

## 2021-11-29 LAB — PSA: Prostate Specific Ag, Serum: 4.9 ng/mL — ABNORMAL HIGH (ref 0.0–4.0)

## 2021-12-02 DIAGNOSIS — J3489 Other specified disorders of nose and nasal sinuses: Secondary | ICD-10-CM | POA: Diagnosis not present

## 2021-12-02 DIAGNOSIS — J300 Vasomotor rhinitis: Secondary | ICD-10-CM | POA: Diagnosis not present

## 2021-12-02 DIAGNOSIS — R040 Epistaxis: Secondary | ICD-10-CM | POA: Diagnosis not present

## 2021-12-02 DIAGNOSIS — R0989 Other specified symptoms and signs involving the circulatory and respiratory systems: Secondary | ICD-10-CM | POA: Diagnosis not present

## 2021-12-02 DIAGNOSIS — Z940 Kidney transplant status: Secondary | ICD-10-CM | POA: Diagnosis not present

## 2021-12-02 DIAGNOSIS — D849 Immunodeficiency, unspecified: Secondary | ICD-10-CM | POA: Diagnosis not present

## 2021-12-02 DIAGNOSIS — R0981 Nasal congestion: Secondary | ICD-10-CM | POA: Diagnosis not present

## 2021-12-02 DIAGNOSIS — Z125 Encounter for screening for malignant neoplasm of prostate: Secondary | ICD-10-CM | POA: Diagnosis not present

## 2021-12-02 DIAGNOSIS — D649 Anemia, unspecified: Secondary | ICD-10-CM | POA: Diagnosis not present

## 2021-12-02 DIAGNOSIS — I10 Essential (primary) hypertension: Secondary | ICD-10-CM | POA: Diagnosis not present

## 2021-12-06 DIAGNOSIS — Z5181 Encounter for therapeutic drug level monitoring: Secondary | ICD-10-CM | POA: Diagnosis not present

## 2021-12-06 DIAGNOSIS — Z940 Kidney transplant status: Secondary | ICD-10-CM | POA: Diagnosis not present

## 2021-12-15 ENCOUNTER — Other Ambulatory Visit: Payer: Self-pay

## 2021-12-15 ENCOUNTER — Other Ambulatory Visit: Payer: BC Managed Care – PPO

## 2021-12-15 DIAGNOSIS — R82998 Other abnormal findings in urine: Secondary | ICD-10-CM

## 2021-12-19 ENCOUNTER — Other Ambulatory Visit: Payer: BC Managed Care – PPO

## 2021-12-19 ENCOUNTER — Other Ambulatory Visit: Payer: Self-pay

## 2021-12-19 DIAGNOSIS — R82998 Other abnormal findings in urine: Secondary | ICD-10-CM

## 2021-12-20 DIAGNOSIS — Z5181 Encounter for therapeutic drug level monitoring: Secondary | ICD-10-CM | POA: Diagnosis not present

## 2021-12-20 DIAGNOSIS — Z940 Kidney transplant status: Secondary | ICD-10-CM | POA: Diagnosis not present

## 2021-12-20 LAB — URINE CULTURE: Organism ID, Bacteria: NO GROWTH

## 2021-12-28 DIAGNOSIS — B001 Herpesviral vesicular dermatitis: Secondary | ICD-10-CM | POA: Diagnosis not present

## 2021-12-28 DIAGNOSIS — R519 Headache, unspecified: Secondary | ICD-10-CM | POA: Diagnosis not present

## 2022-01-04 DIAGNOSIS — R972 Elevated prostate specific antigen [PSA]: Secondary | ICD-10-CM | POA: Diagnosis not present

## 2022-01-13 DIAGNOSIS — Z5181 Encounter for therapeutic drug level monitoring: Secondary | ICD-10-CM | POA: Diagnosis not present

## 2022-01-13 DIAGNOSIS — Z940 Kidney transplant status: Secondary | ICD-10-CM | POA: Diagnosis not present

## 2022-01-20 DIAGNOSIS — D849 Immunodeficiency, unspecified: Secondary | ICD-10-CM | POA: Diagnosis not present

## 2022-01-20 DIAGNOSIS — Z79899 Other long term (current) drug therapy: Secondary | ICD-10-CM | POA: Diagnosis not present

## 2022-01-20 DIAGNOSIS — Z79621 Long term (current) use of calcineurin inhibitor: Secondary | ICD-10-CM | POA: Diagnosis not present

## 2022-01-20 DIAGNOSIS — N184 Chronic kidney disease, stage 4 (severe): Secondary | ICD-10-CM | POA: Diagnosis not present

## 2022-01-20 DIAGNOSIS — Z7952 Long term (current) use of systemic steroids: Secondary | ICD-10-CM | POA: Diagnosis not present

## 2022-01-20 DIAGNOSIS — R040 Epistaxis: Secondary | ICD-10-CM | POA: Diagnosis not present

## 2022-01-20 DIAGNOSIS — D631 Anemia in chronic kidney disease: Secondary | ICD-10-CM | POA: Diagnosis not present

## 2022-01-20 DIAGNOSIS — Z4822 Encounter for aftercare following kidney transplant: Secondary | ICD-10-CM | POA: Diagnosis not present

## 2022-01-20 DIAGNOSIS — E875 Hyperkalemia: Secondary | ICD-10-CM | POA: Diagnosis not present

## 2022-01-20 DIAGNOSIS — I129 Hypertensive chronic kidney disease with stage 1 through stage 4 chronic kidney disease, or unspecified chronic kidney disease: Secondary | ICD-10-CM | POA: Diagnosis not present

## 2022-01-20 DIAGNOSIS — Z940 Kidney transplant status: Secondary | ICD-10-CM | POA: Diagnosis not present

## 2022-01-31 ENCOUNTER — Ambulatory Visit: Payer: BC Managed Care – PPO | Admitting: Psychiatry

## 2022-02-01 DIAGNOSIS — R972 Elevated prostate specific antigen [PSA]: Secondary | ICD-10-CM | POA: Diagnosis not present

## 2022-02-01 DIAGNOSIS — N1330 Unspecified hydronephrosis: Secondary | ICD-10-CM | POA: Diagnosis not present

## 2022-02-01 DIAGNOSIS — N4289 Other specified disorders of prostate: Secondary | ICD-10-CM | POA: Diagnosis not present

## 2022-02-01 DIAGNOSIS — Z940 Kidney transplant status: Secondary | ICD-10-CM | POA: Diagnosis not present

## 2022-02-01 DIAGNOSIS — Z8042 Family history of malignant neoplasm of prostate: Secondary | ICD-10-CM | POA: Diagnosis not present

## 2022-02-01 DIAGNOSIS — Z5181 Encounter for therapeutic drug level monitoring: Secondary | ICD-10-CM | POA: Diagnosis not present

## 2022-02-15 DIAGNOSIS — Z940 Kidney transplant status: Secondary | ICD-10-CM | POA: Diagnosis not present

## 2022-03-03 DIAGNOSIS — C61 Malignant neoplasm of prostate: Secondary | ICD-10-CM | POA: Diagnosis not present

## 2022-03-03 DIAGNOSIS — N4231 Prostatic intraepithelial neoplasia: Secondary | ICD-10-CM | POA: Diagnosis not present

## 2022-03-03 DIAGNOSIS — R972 Elevated prostate specific antigen [PSA]: Secondary | ICD-10-CM | POA: Diagnosis not present

## 2022-03-16 DIAGNOSIS — Z940 Kidney transplant status: Secondary | ICD-10-CM | POA: Diagnosis not present

## 2022-03-16 DIAGNOSIS — D849 Immunodeficiency, unspecified: Secondary | ICD-10-CM | POA: Diagnosis not present

## 2022-03-16 DIAGNOSIS — C61 Malignant neoplasm of prostate: Secondary | ICD-10-CM | POA: Diagnosis not present

## 2022-03-16 DIAGNOSIS — I10 Essential (primary) hypertension: Secondary | ICD-10-CM | POA: Diagnosis not present

## 2022-03-16 DIAGNOSIS — E7800 Pure hypercholesterolemia, unspecified: Secondary | ICD-10-CM | POA: Diagnosis not present

## 2022-04-05 DIAGNOSIS — Z5181 Encounter for therapeutic drug level monitoring: Secondary | ICD-10-CM | POA: Diagnosis not present

## 2022-04-05 DIAGNOSIS — Z940 Kidney transplant status: Secondary | ICD-10-CM | POA: Diagnosis not present

## 2022-04-21 DIAGNOSIS — Z5181 Encounter for therapeutic drug level monitoring: Secondary | ICD-10-CM | POA: Diagnosis not present

## 2022-04-21 DIAGNOSIS — Z940 Kidney transplant status: Secondary | ICD-10-CM | POA: Diagnosis not present

## 2022-04-21 DIAGNOSIS — B349 Viral infection, unspecified: Secondary | ICD-10-CM | POA: Diagnosis not present

## 2022-04-21 DIAGNOSIS — B259 Cytomegaloviral disease, unspecified: Secondary | ICD-10-CM | POA: Diagnosis not present

## 2022-05-13 ENCOUNTER — Ambulatory Visit
Admission: EM | Admit: 2022-05-13 | Discharge: 2022-05-13 | Disposition: A | Discharge status: Home / Self Care | Payer: 59

## 2022-05-13 DIAGNOSIS — H65193 Other acute nonsuppurative otitis media, bilateral: Secondary | ICD-10-CM | POA: Diagnosis not present

## 2022-05-13 MED ORDER — FLUTICASONE PROPIONATE 50 MCG/ACT NA SUSP: 1.0000 | Freq: Two times a day (BID) | NASAL | 2 refills | Status: AC

## 2022-05-13 MED ORDER — PREDNISONE 20 MG PO TABS: 40.0000 mg | ORAL_TABLET | Freq: Every day | ORAL | 0 refills | Status: DC

## 2022-05-13 NOTE — ED Provider Notes (Signed)
[facility]    CSN: [id number] Arrival date & time: 05/13/22  1316      History   Chief Complaint Chief Complaint  Patient presents with   Ear Fullness    2 weeks ago with runny nose, cough, chest congestion, chills, headache. Remains runny nose ear fullness, can't hear well - Entered by patient   Tinnitus    HPI Breton Berns is a 62 y.o. male.   Medical interpreter utilized today to facilitate visit with patient's consent.  He states he has 2 weeks of bilateral ear pressure, ringing sensation after resolving from an upper respiratory infection.  Denies fever, chills, drainage from ears, severe headache, nausea, vomiting.  Not trying anything over-the-counter for symptoms.    Past Medical History:  Diagnosis Date   Anemia    Bilateral chronic knee pain 07/04/2018   Chronic pain of left ankle 07/04/2018   ESRD (end stage renal disease) ([place]) 03/29/2016   Glucosuria 07/04/2018   History of anemia due to CKD 03/29/2016   Hyperkalemia, diminished renal excretion 03/29/2016   Hypertension    Hypertensive nephropathy 3/66/4403   Metabolic acidosis, NAG, 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   Renal disorder    Uremia of renal origin 03/29/2016    Patient Active Problem List   Diagnosis Date Noted   Seizure ([place]) 09/18/2019   Encounter for screening colonoscopy 04/17/2019   Screening for viral disease 04/17/2019   ESRD on hemodialysis ([place]) 03/15/2019   Primary osteoarthritis of right knee 01/14/2019   Primary osteoarthritis of left knee 01/14/2019   Effusion, right knee 01/14/2019   LVH (left ventricular hypertrophy) 12/27/2018   Aftercare including intermittent dialysis ([place]) 08/28/2018   Acidosis 08/28/2018   Anemia in chronic kidney disease 08/28/2018   Arteriovenous fistula, acquired ([place]) 08/28/2018   Chronic pain of left ankle 07/04/2018   Bilateral chronic knee pain 07/04/2018   Glucosuria 07/04/2018   Hypertensive nephropathy 06/11/2018    Hyperkalemia, diminished renal excretion 03/29/2016   ESRD (end stage renal disease) ([place]) 03/29/2016   Uremia of renal origin 03/29/2016   HTN (hypertension) 47/42/5956   Metabolic acidosis, NAG, 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   History of anemia due to CKD 03/29/2016    Past Surgical History:  Procedure Laterality Date   BASCILIC VEIN TRANSPOSITION Left 04/06/2016   Procedure: Left Arm Single Stage Bascilic Vein Transposition;  Surgeon: Waynetta Sandy, MD;  Location: [facility];  Service: Vascular;  Laterality: Left;   INSERTION OF DIALYSIS CATHETER Right 04/06/2016   Procedure: INSERTION OF Tunneled DIALYSIS CATHETER RIGHT INTERNAL JUGULAR;  Surgeon: Waynetta Sandy, MD;  Location: [facility];  Service: Vascular;  Laterality: Right;   IR GENERIC HISTORICAL  06/26/2016   IR REMOVAL TUN CV CATH W/O FL 06/26/2016 Arne Cleveland, MD [facility]   NEPHRECTOMY TRANSPLANTED ORGAN     TOOTH EXTRACTION  2020       Home Medications    Prior to Admission medications   Medication Sig Start Date End Date Taking? Authorizing Provider  acyclovir ointment (ZOVIRAX) 5 % Apply thin layer to affected area 11/28/21 11/28/22 Yes [name], FNP  atovaquone [facility]) 750 MG/5ML suspension Take 750 mg by mouth. 12/02/21  Yes [provider]  calcitRIOL (ROCALTROL) 0.25 MCG capsule Take 1 capsule (0.25 mcg total) by mouth daily. 01/09/19  Yes Rodriguez-Southworth, Sunday Spillers, PA-C  chlorthalidone (HYGROTON) 25 MG tablet Take 1 tablet by mouth daily. 05/02/22 05/02/23 Yes [provider]  ENVARSUS XR 4 MG TB24 Take 4  mg by mouth daily.   Yes [provider]  famotidine (PEPCID) 20 MG tablet Take 20 mg by mouth daily.   Yes [provider]  fluticasone (FLONASE) 50 MCG/ACT nasal spray Place 1 spray into both nostrils 2 (two) times daily. 05/13/22  Yes Volney American, PA-C  labetalol (NORMODYNE) 100 MG tablet Take 1 tablet by mouth 3 (three) times  daily. 05/02/22 05/02/23 Yes [provider]  labetalol (NORMODYNE) 200 MG tablet Take 200 mg by mouth 2 (two) times daily.   Yes [provider]  LOKELMA 10 g PACK packet Take 10 g by mouth daily. 04/10/22  Yes [provider]  mycophenolate (CELLCEPT) 250 MG capsule Take 500 mg by mouth 2 (two) times daily.   Yes [provider]  predniSONE (DELTASONE) 20 MG tablet Take 2 tablets (40 mg total) by mouth daily with breakfast. 05/13/22  Yes Volney American, PA-C  sodium bicarbonate 650 MG tablet Take 1,300 mg by mouth 2 (two) times daily. 05/02/22 05/02/23 Yes [provider]  tamsulosin (FLOMAX) 0.4 MG CAPS capsule Take 0.4 mg by mouth daily after supper. 04/20/21  Yes [provider]  VITAMIN D-1000 MAX ST 25 MCG (1000 UT) tablet Take 1,000 Units by mouth daily. 05/02/22  Yes [provider]  amLODipine (NORVASC) 5 MG tablet Take by mouth. Patient not taking: Reported on 05/13/2022 10/11/20   [provider]  cinacalcet (SENSIPAR) 60 MG tablet Take 1 tablet by mouth daily. 08/19/18   [provider]  FOSRENOL 500 MG chewable tablet Chew 500 mg by mouth 5 (five) times daily. 05/29/19   [provider]  methocarbamol (ROBAXIN) 500 MG tablet Take 1 tablet (500 mg total) by mouth 2 (two) times daily. 06/07/20   Eustaquio Maize, PA-C  multivitamin (RENA-VIT) TABS tablet Take 1 tablet by mouth at bedtime. 04/10/16   Lavina Hamman, MD  oxyCODONE-acetaminophen (PERCOCET/ROXICET) 5-325 MG tablet Take 1 tablet by mouth every 6 (six) hours as needed for severe pain. Patient not taking: Reported on 11/24/2020 06/07/20   Eustaquio Maize, PA-C  predniSONE (STERAPRED UNI-PAK 21 TAB) 5 MG (21) TBPK tablet Take as directed if ok with nephrology Patient not taking: Reported on 11/24/2020 06/15/20   Aundra Dubin, PA-C  sevelamer carbonate (RENVELA) 800 MG tablet Take by mouth. 06/10/19   [provider]  sucroferric oxyhydroxide  (VELPHORO) 500 MG chewable tablet Chew 1 tablet (500 mg total) by mouth 3 (three) times daily with meals. 01/09/19   Rodriguez-Southworth, Sunday Spillers, PA-C    Family History Family History  Problem Relation Age of Onset   Breast cancer Mother    Prostate cancer Father    Diabetes Brother    Breast cancer Sister    Colon cancer Neg Hx    Esophageal cancer Neg Hx    Inflammatory bowel disease Neg Hx    Liver disease Neg Hx    Pancreatic cancer Neg Hx    Rectal cancer Neg Hx    Stomach cancer Neg Hx     Social History Social History   Tobacco Use   Smoking status: Never   Smokeless tobacco: Never  Vaping Use   Vaping Use: Never used  Substance Use Topics   Alcohol use: No   Drug use: No     Allergies   Patient has no known allergies.   Review of Systems Review of Systems Per HPI  Physical Exam Triage Vital Signs ED Triage Vitals  Enc Vitals Group  BP 05/13/22 1425 (!) 143/68     Pulse Rate 05/13/22 1425 71     Resp 05/13/22 1425 20     Temp 05/13/22 1425 98.1 F (36.7 C)     Temp Source 05/13/22 1425 Oral     SpO2 05/13/22 1425 99 %     Weight --      Height --      Head Circumference --      Peak Flow --      Pain Score 05/13/22 1434 0     Pain Loc --      Pain Edu? --      Excl. in [place]? --    No data found.  Updated Vital Signs BP (!) 143/68 (BP Location: Right Arm)   Pulse 71   Temp 98.1 F (36.7 C) (Oral)   Resp 20   SpO2 99%   Visual Acuity Right Eye Distance:   Left Eye Distance:   Bilateral Distance:    Right Eye Near:   Left Eye Near:    Bilateral Near:     Physical Exam Vitals and nursing note reviewed.  Constitutional:      Appearance: Normal appearance.  HENT:     Head: Atraumatic.     Ears:     Comments: Bilateral middle ear effusions    Nose: Nose normal.     Mouth/Throat:     Mouth: Mucous membranes are moist.     Pharynx: No posterior oropharyngeal erythema.  Eyes:     Extraocular Movements: Extraocular movements  intact.     Conjunctiva/sclera: Conjunctivae normal.  Cardiovascular:     Rate and Rhythm: Normal rate.  Pulmonary:     Effort: Pulmonary effort is normal.     Breath sounds: Normal breath sounds. No wheezing or rales.  Musculoskeletal:        General: Normal range of motion.     Cervical back: Normal range of motion and neck supple.  Skin:    General: Skin is warm and dry.  Neurological:     General: No focal deficit present.     Mental Status: He is oriented to person, place, and time.  Psychiatric:        Mood and Affect: Mood normal.        Thought Content: Thought content normal.        Judgment: Judgment normal.      UC Treatments / Results  Labs (all labs ordered are listed, but only abnormal results are displayed) Labs Reviewed - No data to display  EKG   Radiology No results found.  Procedures Procedures (including critical care time)  Medications Ordered in UC Medications - No data to display  Initial Impression / Assessment and Plan / UC Course  I have reviewed the triage vital signs and the nursing notes.  Pertinent labs & imaging results that were available during my care of the patient were reviewed by me and considered in my medical decision making (see chart for details).     Treat with short course of prednisone, Flonase, sinus rinses and supportive home care.  Return for worsening symptoms.  Final Clinical Impressions(s) / UC Diagnoses   Final diagnoses:  Acute effusion of both middle ears   Discharge Instructions   None    ED Prescriptions     Medication Sig Dispense Auth. Provider   predniSONE (DELTASONE) 20 MG tablet Take 2 tablets (40 mg total) by mouth daily with breakfast. 6 tablet Volney American, [place]  fluticasone (FLONASE) 50 MCG/ACT nasal spray Place 1 spray into both nostrils 2 (two) times daily. 16 g Volney American, [place]      PDMP not reviewed this encounter.   Volney American, [place] 05/13/22  1506

## 2022-05-13 NOTE — ED Triage Notes (Signed)
Ringing sounds in both ears for about 2 weeks. It is a constant ringing sound that hasn't gone away.

## 2022-05-19 DIAGNOSIS — Z940 Kidney transplant status: Secondary | ICD-10-CM | POA: Diagnosis not present

## 2022-05-19 DIAGNOSIS — B349 Viral infection, unspecified: Secondary | ICD-10-CM | POA: Diagnosis not present

## 2022-05-19 DIAGNOSIS — Z5181 Encounter for therapeutic drug level monitoring: Secondary | ICD-10-CM | POA: Diagnosis not present

## 2022-05-19 DIAGNOSIS — B259 Cytomegaloviral disease, unspecified: Secondary | ICD-10-CM | POA: Diagnosis not present

## 2022-05-31 ENCOUNTER — Encounter: Payer: Self-pay | Admitting: Nurse Practitioner

## 2022-05-31 ENCOUNTER — Ambulatory Visit (INDEPENDENT_AMBULATORY_CARE_PROVIDER_SITE_OTHER): Payer: 59 | Admitting: Nurse Practitioner

## 2022-05-31 VITALS — BP 142/80 | HR 68 | Temp 98.2°F | Ht 66.0 in | Wt 191.0 lb

## 2022-05-31 DIAGNOSIS — Z940 Kidney transplant status: Secondary | ICD-10-CM | POA: Diagnosis not present

## 2022-05-31 DIAGNOSIS — I120 Hypertensive chronic kidney disease with stage 5 chronic kidney disease or end stage renal disease: Secondary | ICD-10-CM

## 2022-05-31 DIAGNOSIS — I129 Hypertensive chronic kidney disease with stage 1 through stage 4 chronic kidney disease, or unspecified chronic kidney disease: Secondary | ICD-10-CM

## 2022-05-31 DIAGNOSIS — J069 Acute upper respiratory infection, unspecified: Secondary | ICD-10-CM

## 2022-05-31 DIAGNOSIS — I770 Arteriovenous fistula, acquired: Secondary | ICD-10-CM | POA: Diagnosis not present

## 2022-05-31 DIAGNOSIS — N183 Chronic kidney disease, stage 3 unspecified: Secondary | ICD-10-CM | POA: Diagnosis not present

## 2022-05-31 DIAGNOSIS — B309 Viral conjunctivitis, unspecified: Secondary | ICD-10-CM | POA: Diagnosis not present

## 2022-05-31 NOTE — Progress Notes (Signed)
I,Dylan [name],acting as a Education administrator for [employer], FNP.,have documented all relevant documentation on the behalf of Dylan Brine, FNP,as directed by  Dylan Brine, FNP while in the presence of Dylan [name], [place].  Subjective:     Patient ID: Dylan [name] , male    DOB: 08/26/1960 , 62 y.o.   MRN: [id number]   Chief Complaint  Patient presents with   Hypertension    HPI  Patient presents today for HTN follow up.   He has taking acetaminophen, saline NS and flonase. He stopped taking one of them due to nasal irritation. He will have pink to his eyes and will wipe his eyes in the middle of the night. Next appt on 06/16/2022 with Transplant. He is taking his medications as directed.   Hypertension This is a chronic problem. The current episode started more than 1 year ago. The problem is uncontrolled. Pertinent negatives include no anxiety. Risk factors for coronary artery disease include obesity.     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   Renal disorder    Uremia of renal origin 03/29/2016     Family History  Problem Relation Age of Onset   Breast cancer Mother    Prostate cancer Father    Diabetes Brother    Breast cancer Sister    Colon cancer Neg Hx    Esophageal cancer Neg Hx    Inflammatory bowel disease Neg Hx    Liver disease Neg Hx    Pancreatic cancer Neg Hx    Rectal cancer Neg Hx    Stomach cancer Neg Hx      Current Outpatient Medications:    amoxicillin (AMOXIL) 500 MG tablet, Take 1 tablet (500 mg total) by mouth 2 (two) times daily., Disp: 14 tablet, Rfl: 0    chlorthalidone (HYGROTON) 25 MG tablet, Take 1 tablet by mouth daily., Disp: , Rfl:    ENVARSUS XR 4 MG TB24, Take 4 mg by mouth daily., Disp: , Rfl:    famotidine (PEPCID) 20 MG tablet, Take 20 mg by mouth daily., Disp: , Rfl:    fluticasone (FLONASE) 50 MCG/ACT nasal spray, Place 1 spray into both nostrils 2 (two) times daily., Disp: 16 g, Rfl: 2   labetalol (NORMODYNE) 100 MG tablet, Take 1 tablet by mouth 3 (three) times daily., Disp: , Rfl:    labetalol (NORMODYNE) 200 MG tablet, Take 200 mg by mouth 2 (two) times daily., Disp: , Rfl:    LOKELMA 10 g PACK packet, Take 10 g by mouth daily., Disp: , Rfl:    mycophenolate (CELLCEPT) 250 MG capsule, Take 500 mg by mouth 2 (two) times daily., Disp: , Rfl:    rosuvastatin (CRESTOR) 10 MG tablet, Take 10 mg by mouth daily., Disp: , Rfl:    sodium bicarbonate 650 MG tablet, Take 1,300 mg by mouth 2 (two) times daily., Disp: , Rfl:    tamsulosin (FLOMAX) 0.4 MG CAPS capsule, Take 0.4 mg by mouth daily after supper., Disp: , Rfl:    VITAMIN D-1000 MAX ST 25 MCG (1000 UT) tablet, Take 1,000 Units by mouth daily., Disp: ,  Rfl:    acyclovir ointment (ZOVIRAX) 5 %, Apply thin layer to affected area, Disp: 15 g, Rfl: 2   trimethoprim-polymyxin b (POLYTRIM) ophthalmic solution, Place 2 drops into both eyes every 6 (six) hours., Disp: 10 mL, Rfl: 0   No Known Allergies   Review of Systems  Constitutional:  Positive for chills. Negative for fatigue and fever.  HENT:  Positive for ear pain (right ear pain) and sore throat.   Respiratory: Negative.  Negative for cough.   Cardiovascular: Negative.   Gastrointestinal: Negative.   Neurological: Negative.   Psychiatric/Behavioral: Negative.       Today's Vitals   05/31/22 1522  BP: (!) 142/80  Pulse: 68  Temp: 98.2 F (36.8 C)  TempSrc: Oral  Weight: 191 lb (86.6 kg)  Height: 5' 6"$  (1.676 m)   Body mass index is 30.83 kg/m.   Objective:  Physical Exam Vitals reviewed.  Constitutional:       General: He is not in acute distress.    Appearance: Normal appearance.  HENT:     Head: Normocephalic.     Right Ear: Tympanic membrane, ear canal and external ear normal. There is no impacted cerumen.     Left Ear: Tympanic membrane, ear canal and external ear normal. There is no impacted cerumen.     Nose: Nose normal.     Mouth/Throat:     Mouth: Mucous membranes are moist.  Eyes:     General: Lids are normal.     Comments: Erythema bilateral sclera  Cardiovascular:     Rate and Rhythm: Normal rate and regular rhythm.     Pulses: Normal pulses.     Heart sounds: Normal heart sounds. No murmur heard. Pulmonary:     Effort: Pulmonary effort is normal. No respiratory distress.     Breath sounds: Normal breath sounds. No wheezing.  Neurological:     Mental Status: He is alert.         Assessment And Plan:     1. Benign hypertension with CKD (chronic kidney disease) stage III (HCC) Comments: Blood pressure is fairly controlled. Continue f/u with transplant team and continue to avoid nephrotoxic medications  2. S/P kidney transplant Comments: Continue follow-up with transplant team at [facility].  3. Arteriovenous fistula, acquired ([place]) Comments: Positive for thrill and bruit.  No longer receiving dialysis.  4. Acute viral conjunctivitis of both eyes Comments: Has erythema to sclera.,  No drainage noted.  Will see what medications patient is able to take due to the renal transplant  5. Upper respiratory tract infection, unspecified type Comments: New onset of symptoms.  Will contact transplant team to see what he can be treated with in reference to antibiotics.     Patient was given opportunity to ask questions. Patient verbalized understanding of the plan and was able to repeat key elements of the plan. All questions were answered to their satisfaction.  Dylan Brine, FNP    I, Dylan Brine, FNP, have reviewed all documentation for this visit. The documentation on 05/31/22  for the exam, diagnosis, procedures, and orders are all accurate and complete.  IF YOU HAVE BEEN REFERRED TO A SPECIALIST, IT MAY TAKE 1-2 WEEKS TO SCHEDULE/PROCESS THE REFERRAL. IF YOU HAVE NOT HEARD FROM US/SPECIALIST IN TWO WEEKS, PLEASE GIVE [place] A CALL AT [phone] X [phone].   THE PATIENT IS ENCOURAGED TO PRACTICE SOCIAL DISTANCING DUE TO THE COVID-19 PANDEMIC.

## 2022-05-31 NOTE — Patient Instructions (Signed)
Hypertension, Adult High blood pressure (hypertension) is when the force of blood pumping through the arteries is too strong. The arteries are the blood vessels that carry blood from the heart throughout the body. Hypertension forces the heart to work harder to pump blood and may cause arteries to become narrow or stiff. Untreated or uncontrolled hypertension can lead to a heart attack, heart failure, a stroke, kidney disease, and other problems. A blood pressure reading consists of a higher number over a lower number. Ideally, your blood pressure should be below 120/80. The first ("top") number is called the systolic pressure. It is a measure of the pressure in your arteries as your heart beats. The second ("bottom") number is called the diastolic pressure. It is a measure of the pressure in your arteries as the heart relaxes. What are the causes? The exact cause of this condition is not known. There are some conditions that result in high blood pressure. What increases the risk? Certain factors may make you more likely to develop high blood pressure. Some of these risk factors are under your control, including: Smoking. Not getting enough exercise or physical activity. Being overweight. Having too much fat, sugar, calories, or salt (sodium) in your diet. Drinking too much alcohol. Other risk factors include: Having a personal history of heart disease, diabetes, high cholesterol, or kidney disease. Stress. Having a family history of high blood pressure and high cholesterol. Having obstructive sleep apnea. Age. The risk increases with age. What are the signs or symptoms? High blood pressure may not cause symptoms. Very high blood pressure (hypertensive crisis) may cause: Headache. Fast or irregular heartbeats (palpitations). Shortness of breath. Nosebleed. Nausea and vomiting. Vision changes. Severe chest pain, dizziness, and seizures. How is this diagnosed? This condition is diagnosed by  measuring your blood pressure while you are seated, with your arm resting on a flat surface, your legs uncrossed, and your feet flat on the floor. The cuff of the blood pressure monitor will be placed directly against the skin of your upper arm at the level of your heart. Blood pressure should be measured at least twice using the same arm. Certain conditions can cause a difference in blood pressure between your right and left arms. If you have a high blood pressure reading during one visit or you have normal blood pressure with other risk factors, you may be asked to: Return on a different day to have your blood pressure checked again. Monitor your blood pressure at home for 1 week or longer. If you are diagnosed with hypertension, you may have other blood or imaging tests to help your health care provider understand your overall risk for other conditions. How is this treated? This condition is treated by making healthy lifestyle changes, such as eating healthy foods, exercising more, and reducing your alcohol intake. You may be referred for counseling on a healthy diet and physical activity. Your health care provider may prescribe medicine if lifestyle changes are not enough to get your blood pressure under control and if: Your systolic blood pressure is above 130. Your diastolic blood pressure is above 80. Your personal target blood pressure may vary depending on your medical conditions, your age, and other factors. Follow these instructions at home: Eating and drinking  Eat a diet that is high in fiber and potassium, and low in sodium, added sugar, and fat. An example of this eating plan is called the DASH diet. DASH stands for Dietary Approaches to Stop Hypertension. To eat this way: Eat   plenty of fresh fruits and vegetables. Try to fill one half of your plate at each meal with fruits and vegetables. Eat whole grains, such as whole-wheat pasta, brown rice, or whole-grain bread. Fill about one  fourth of your plate with whole grains. Eat or drink low-fat dairy products, such as skim milk or low-fat yogurt. Avoid fatty cuts of meat, processed or cured meats, and poultry with skin. Fill about one fourth of your plate with lean proteins, such as fish, chicken without skin, beans, eggs, or tofu. Avoid pre-made and processed foods. These tend to be higher in sodium, added sugar, and fat. Reduce your daily sodium intake. Many people with hypertension should eat less than 1,500 mg of sodium a day. Do not drink alcohol if: Your health care provider tells you not to drink. You are pregnant, may be pregnant, or are planning to become pregnant. If you drink alcohol: Limit how much you have to: 0-1 drink a day for women. 0-2 drinks a day for men. Know how much alcohol is in your drink. In the [place] one drink equals one 12 oz bottle of beer (355 mL), one 5 oz glass of wine (148 mL), or one 1 oz glass of hard liquor (44 mL). Lifestyle  Work with your health care provider to maintain a healthy body weight or to lose weight. Ask what an ideal weight is for you. Get at least 30 minutes of exercise that causes your heart to beat faster (aerobic exercise) most days of the week. Activities may include walking, swimming, or biking. Include exercise to strengthen your muscles (resistance exercise), such as Pilates or lifting weights, as part of your weekly exercise routine. Try to do these types of exercises for 30 minutes at least 3 days a week. Do not use any products that contain nicotine or tobacco. These products include cigarettes, chewing tobacco, and vaping devices, such as e-cigarettes. If you need help quitting, ask your health care provider. Monitor your blood pressure at home as told by your health care provider. Keep all follow-up visits. This is important. Medicines Take over-the-counter and prescription medicines only as told by your health care provider. Follow directions carefully. Blood  pressure medicines must be taken as prescribed. Do not skip doses of blood pressure medicine. Doing this puts you at risk for problems and can make the medicine less effective. Ask your health care provider about side effects or reactions to medicines that you should watch for. Contact a health care provider if you: Think you are having a reaction to a medicine you are taking. Have headaches that keep coming back (recurring). Feel dizzy. Have swelling in your ankles. Have trouble with your vision. Get help right away if you: Develop a severe headache or confusion. Have unusual weakness or numbness. Feel faint. Have severe pain in your chest or abdomen. Vomit repeatedly. Have trouble breathing. These symptoms may be an emergency. Get help right away. Call 911. Do not wait to see if the symptoms will go away. Do not drive yourself to the hospital. Summary Hypertension is when the force of blood pumping through your arteries is too strong. If this condition is not controlled, it may put you at risk for serious complications. Your personal target blood pressure may vary depending on your medical conditions, your age, and other factors. For most people, a normal blood pressure is less than 120/80. Hypertension is treated with lifestyle changes, medicines, or a combination of both. Lifestyle changes include losing weight, eating a healthy,   low-sodium diet, exercising more, and limiting alcohol. This information is not intended to replace advice given to you by your health care provider. Make sure you discuss any questions you have with your health care provider. Document Revised: 02/22/2021 Document Reviewed: 02/22/2021 Elsevier Patient Education  2023 Elsevier Inc.  

## 2022-06-02 ENCOUNTER — Encounter: Payer: Self-pay | Admitting: Nurse Practitioner

## 2022-06-03 ENCOUNTER — Encounter: Payer: Self-pay | Admitting: Nurse Practitioner

## 2022-06-03 MED ORDER — AMOXICILLIN 500 MG PO TABS: 500.0000 mg | ORAL_TABLET | Freq: Two times a day (BID) | ORAL | 0 refills | Status: DC

## 2022-06-05 ENCOUNTER — Other Ambulatory Visit: Payer: Self-pay | Admitting: Nurse Practitioner

## 2022-06-05 DIAGNOSIS — H1033 Unspecified acute conjunctivitis, bilateral: Secondary | ICD-10-CM

## 2022-06-05 MED ORDER — POLYMYXIN B-TRIMETHOPRIM 10000-0.1 UNIT/ML-% OP SOLN: 2.0000 [drp] | Freq: Four times a day (QID) | OPHTHALMIC | 0 refills | Status: DC

## 2022-06-16 DIAGNOSIS — Z940 Kidney transplant status: Secondary | ICD-10-CM | POA: Diagnosis not present

## 2022-06-16 DIAGNOSIS — I770 Arteriovenous fistula, acquired: Secondary | ICD-10-CM | POA: Diagnosis not present

## 2022-06-16 DIAGNOSIS — D849 Immunodeficiency, unspecified: Secondary | ICD-10-CM | POA: Diagnosis not present

## 2022-06-16 DIAGNOSIS — E872 Acidosis, unspecified: Secondary | ICD-10-CM | POA: Diagnosis not present

## 2022-06-16 DIAGNOSIS — E875 Hyperkalemia: Secondary | ICD-10-CM | POA: Diagnosis not present

## 2022-06-16 DIAGNOSIS — C61 Malignant neoplasm of prostate: Secondary | ICD-10-CM | POA: Diagnosis not present

## 2022-06-16 DIAGNOSIS — I10 Essential (primary) hypertension: Secondary | ICD-10-CM | POA: Diagnosis not present

## 2022-06-29 DIAGNOSIS — B259 Cytomegaloviral disease, unspecified: Secondary | ICD-10-CM | POA: Diagnosis not present

## 2022-06-29 DIAGNOSIS — B349 Viral infection, unspecified: Secondary | ICD-10-CM | POA: Diagnosis not present

## 2022-06-29 DIAGNOSIS — Z5181 Encounter for therapeutic drug level monitoring: Secondary | ICD-10-CM | POA: Diagnosis not present

## 2022-06-29 DIAGNOSIS — Z940 Kidney transplant status: Secondary | ICD-10-CM | POA: Diagnosis not present

## 2022-07-14 DIAGNOSIS — C61 Malignant neoplasm of prostate: Secondary | ICD-10-CM | POA: Diagnosis not present

## 2022-07-21 DIAGNOSIS — C61 Malignant neoplasm of prostate: Secondary | ICD-10-CM | POA: Diagnosis not present

## 2022-07-21 DIAGNOSIS — Z940 Kidney transplant status: Secondary | ICD-10-CM | POA: Diagnosis not present

## 2022-07-21 DIAGNOSIS — Z01818 Encounter for other preprocedural examination: Secondary | ICD-10-CM | POA: Diagnosis not present

## 2022-07-26 DIAGNOSIS — C61 Malignant neoplasm of prostate: Secondary | ICD-10-CM | POA: Diagnosis not present

## 2022-08-03 ENCOUNTER — Emergency Department (HOSPITAL_COMMUNITY)
Admission: EM | Admit: 2022-08-03 | Discharge: 2022-08-03 | Disposition: A | Discharge status: Home / Self Care | Payer: 59 | Attending: Emergency Medicine | Admitting: Emergency Medicine

## 2022-08-03 ENCOUNTER — Encounter (HOSPITAL_COMMUNITY): Payer: Self-pay

## 2022-08-03 ENCOUNTER — Other Ambulatory Visit: Payer: Self-pay

## 2022-08-03 DIAGNOSIS — Z5321 Procedure and treatment not carried out due to patient leaving prior to being seen by health care provider: Secondary | ICD-10-CM | POA: Diagnosis not present

## 2022-08-03 DIAGNOSIS — R300 Dysuria: Secondary | ICD-10-CM | POA: Insufficient documentation

## 2022-08-03 DIAGNOSIS — C61 Malignant neoplasm of prostate: Secondary | ICD-10-CM | POA: Diagnosis not present

## 2022-08-03 LAB — BASIC METABOLIC PANEL
Anion gap: 7 (ref 5–15)
BUN: 71 mg/dL — ABNORMAL HIGH (ref 8–23)
CO2: 19 mmol/L — ABNORMAL LOW (ref 22–32)
Calcium: 9.1 mg/dL (ref 8.9–10.3)
Chloride: 103 mmol/L (ref 98–111)
Creatinine, Ser: 3.84 mg/dL — ABNORMAL HIGH (ref 0.61–1.24)
GFR, Estimated: 17 mL/min — ABNORMAL LOW (ref >60)
Glucose, Bld: 138 mg/dL — ABNORMAL HIGH (ref 70–99)
Potassium: 5.2 mmol/L — ABNORMAL HIGH (ref 3.5–5.1)
Sodium: 129 mmol/L — ABNORMAL LOW (ref 135–145)

## 2022-08-03 LAB — CBC
HCT: 30.9 % — ABNORMAL LOW (ref 39.0–52.0)
Hemoglobin: 10.8 g/dL — ABNORMAL LOW (ref 13.0–17.0)
MCH: 32.2 pg (ref 26.0–34.0)
MCHC: 35 g/dL (ref 30.0–36.0)
MCV: 92.2 fL (ref 80.0–100.0)
Platelets: 260 10*3/uL (ref 150–400)
RBC: 3.35 MIL/uL — ABNORMAL LOW (ref 4.22–5.81)
RDW: 13.8 % (ref 11.5–15.5)
WBC: 4.9 10*3/uL (ref 4.0–10.5)
nRBC: 0 % (ref 0.0–0.2)

## 2022-08-03 NOTE — ED Triage Notes (Signed)
Was seen at clinic and had procedure on prostate. Catheter was placed. Today catheter was removed and now pt endorses severe pain and burning when attempting to urinate. Denies any radiation.   Pt has hx of kidney transplant.   Translation services used in triage.  A & O x 4, NAD, ambulatory in triage.

## 2022-08-04 ENCOUNTER — Ambulatory Visit: Payer: 59

## 2022-08-04 DIAGNOSIS — C61 Malignant neoplasm of prostate: Secondary | ICD-10-CM | POA: Diagnosis not present

## 2022-08-07 ENCOUNTER — Other Ambulatory Visit: Payer: Self-pay | Admitting: Family Medicine

## 2022-08-08 NOTE — Telephone Encounter (Signed)
Urgent Care patient Requested Prescriptions  Pending Prescriptions Disp Refills   fluticasone (FLONASE) 50 MCG/ACT nasal spray [Pharmacy Med Name: FLUTICASONE PROP 50 MCG SPRAY] 16 mL 2    Sig: PLACE 1 SPRAY INTO BOTH NOSTRILS 2 (TWO) TIMES DAILY     There is no refill protocol information for this order

## 2022-08-09 ENCOUNTER — Ambulatory Visit (INDEPENDENT_AMBULATORY_CARE_PROVIDER_SITE_OTHER): Payer: 59 | Admitting: Physician Assistant

## 2022-08-09 VITALS — BP 135/74 | HR 71 | Temp 97.6°F | Wt 195.0 lb

## 2022-08-09 DIAGNOSIS — Z992 Dependence on renal dialysis: Secondary | ICD-10-CM

## 2022-08-09 DIAGNOSIS — N186 End stage renal disease: Secondary | ICD-10-CM

## 2022-08-09 NOTE — Progress Notes (Signed)
Established Dialysis Access   History of Present Illness   Dylan [name] is a 62 y.o. 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 Patient has a left basilic vein fistula that was created by Dr. Randie [name] in December of 2017.  He was last seen in June of 2021 by Dr. Randie [name]. 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 There was no concerns for ulceration or bleeding so he was advised to follow up PRN.  He returns today with his daughter for re evaluation of his aneurysms. He reports no pain, skin changes, or issues with the fistula. He explains that he was seen by new PCP who evaluated him and had concerns about the aneurysms and wanted them evaluated. He has a functioning renal transplant so he is not using the fistula at this time. He is without any signs or symptoms of steal. He does not feel that the aneurysms are growing.   Current Outpatient Medications  Medication Sig Dispense Refill   acyclovir ointment (ZOVIRAX) 5 % Apply thin layer to affected area 15 g 2   amoxicillin (AMOXIL) 500 MG tablet Take 1 tablet (500 mg total) by mouth 2 (two) times daily. 14 tablet 0   chlorthalidone (HYGROTON) 25 MG tablet Take 1 tablet by mouth daily.     ENVARSUS XR 4 MG TB24 Take 4 mg by mouth daily.     famotidine (PEPCID) 20 MG tablet Take 20 mg by mouth daily.     fluticasone (FLONASE) 50 MCG/ACT nasal spray Place 1 spray into both nostrils 2 (two) times daily. 16 g 2   labetalol (NORMODYNE) 100 MG tablet Take 1 tablet by mouth 3 (three) times daily.     labetalol (NORMODYNE) 200 MG tablet Take 200 mg by mouth 2 (two) times daily.     LOKELMA 10 g PACK packet Take 10 g by mouth daily.     mycophenolate (CELLCEPT) 250 MG capsule Take 500 mg by mouth 2 (two) times daily.     rosuvastatin (CRESTOR) 10 MG tablet Take 10 mg by mouth daily.     sodium bicarbonate  650 MG tablet Take 1,300 mg by mouth 2 (two) times daily.     tamsulosin (FLOMAX) 0.4 MG CAPS capsule Take 0.4 mg by mouth daily after supper.     trimethoprim-polymyxin b (POLYTRIM) ophthalmic solution Place 2 drops into both eyes every 6 (six) hours. 10 mL 0   VITAMIN D-1000 MAX ST 25 MCG (1000 UT) tablet Take 1,000 Units by mouth daily.     No current facility-administered medications for this visit.    On ROS today: negative unless stated in HPI   Physical Examination   Vitals:   08/09/22 1441  BP: 135/74  Pulse: 71  Temp: 97.6 F (36.4 C)  TempSrc: Temporal  SpO2: 99%  Weight: 195 lb (88.5 kg)   Body mass index is 30.97 kg/m.  General Well appearing, well nourished, in no distress  Pulmonary Non labored  Cardiac Regular rate and rhythm  Vascular 2+ left radial pulse, left AV fistula with 2 areas of aneurysm. No compromise of overlying skin. Skin is easily mobile over the aneurysms. Fistula has a good thrill. It is pulsatile. Audible bruit   Musculo- skeletal M/S 5/5 throughout  , Extremities without ischemic changes    Neurologic A&O; CN grossly intact  Medical Decision Making   Dylan [name] is a 62 y.o. 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 Patient has a left basilic vein fistula that was created by Dr. Randie [name] in December of 2017. He has had these aneurysms for several years now. He feels they have not changed in size. He has no associated pain. On exam there is no compromise of the skin that would be concerning for bleeding. There is no indication at this time for any surgical revision. The fistula is pulsatile and I suspect some area of stenosis in the fistula that is contributing to the aneurysms however with his renal transplant would not recommend fistulogram with contrast to protect his kidney. Discussed with patient and his daughter that if at any point the aneurysms seem to be growing, become painful or there is ulceration of the skin we  would want to see him back for evaluation. He can follow up as needed if he has any increase in fistula size, pain, or compromise of the skin   Dylan Congress, PA-C [facility] Office: [phone]  Clinic MD: Dylan [name]

## 2022-08-14 DIAGNOSIS — C61 Malignant neoplasm of prostate: Secondary | ICD-10-CM | POA: Diagnosis not present

## 2022-08-24 DIAGNOSIS — Z5181 Encounter for therapeutic drug level monitoring: Secondary | ICD-10-CM | POA: Diagnosis not present

## 2022-08-24 DIAGNOSIS — B259 Cytomegaloviral disease, unspecified: Secondary | ICD-10-CM | POA: Diagnosis not present

## 2022-08-24 DIAGNOSIS — B349 Viral infection, unspecified: Secondary | ICD-10-CM | POA: Diagnosis not present

## 2022-08-24 DIAGNOSIS — Z940 Kidney transplant status: Secondary | ICD-10-CM | POA: Diagnosis not present

## 2022-08-29 DIAGNOSIS — R300 Dysuria: Secondary | ICD-10-CM | POA: Diagnosis not present

## 2022-08-30 ENCOUNTER — Other Ambulatory Visit: Payer: Self-pay

## 2022-08-30 ENCOUNTER — Emergency Department (HOSPITAL_COMMUNITY)
Admission: EM | Admit: 2022-08-30 | Discharge: 2022-08-31 | Discharge status: Against Medical Advice | Payer: 59 | Attending: Emergency Medicine | Admitting: Emergency Medicine

## 2022-08-30 ENCOUNTER — Encounter (HOSPITAL_COMMUNITY): Payer: Self-pay | Admitting: *Deleted

## 2022-08-30 DIAGNOSIS — Z5321 Procedure and treatment not carried out due to patient leaving prior to being seen by health care provider: Secondary | ICD-10-CM | POA: Diagnosis not present

## 2022-08-30 DIAGNOSIS — Z940 Kidney transplant status: Secondary | ICD-10-CM | POA: Diagnosis not present

## 2022-08-30 DIAGNOSIS — R319 Hematuria, unspecified: Secondary | ICD-10-CM | POA: Insufficient documentation

## 2022-08-30 LAB — URINALYSIS, ROUTINE W REFLEX MICROSCOPIC
Bilirubin Urine: NEGATIVE
Glucose, UA: NEGATIVE mg/dL
Ketones, ur: NEGATIVE mg/dL
Nitrite: POSITIVE — AB
Protein, ur: 100 mg/dL — AB
RBC / HPF: >50 RBC/hpf (ref 0–5)
Specific Gravity, Urine: 1.013 (ref 1.005–1.030)
pH: 7 (ref 5.0–8.0)

## 2022-08-30 LAB — COMPREHENSIVE METABOLIC PANEL
ALT: 16 U/L (ref 0–44)
AST: 16 U/L (ref 15–41)
Albumin: 3.9 g/dL (ref 3.5–5.0)
Alkaline Phosphatase: 73 U/L (ref 38–126)
Anion gap: 11 (ref 5–15)
BUN: 45 mg/dL — ABNORMAL HIGH (ref 8–23)
CO2: 16 mmol/L — ABNORMAL LOW (ref 22–32)
Calcium: 9.5 mg/dL (ref 8.9–10.3)
Chloride: 107 mmol/L (ref 98–111)
Creatinine, Ser: 2.36 mg/dL — ABNORMAL HIGH (ref 0.61–1.24)
GFR, Estimated: 30 mL/min — ABNORMAL LOW (ref >60)
Glucose, Bld: 161 mg/dL — ABNORMAL HIGH (ref 70–99)
Potassium: 4.9 mmol/L (ref 3.5–5.1)
Sodium: 134 mmol/L — ABNORMAL LOW (ref 135–145)
Total Bilirubin: 0.3 mg/dL (ref 0.3–1.2)
Total Protein: 7 g/dL (ref 6.5–8.1)

## 2022-08-30 LAB — CBC
HCT: 30.8 % — ABNORMAL LOW (ref 39.0–52.0)
Hemoglobin: 9.6 g/dL — ABNORMAL LOW (ref 13.0–17.0)
MCH: 28.4 pg (ref 26.0–34.0)
MCHC: 31.2 g/dL (ref 30.0–36.0)
MCV: 91.1 fL (ref 80.0–100.0)
Platelets: 320 10*3/uL (ref 150–400)
RBC: 3.38 MIL/uL — ABNORMAL LOW (ref 4.22–5.81)
RDW: 13.4 % (ref 11.5–15.5)
WBC: 6 10*3/uL (ref 4.0–10.5)
nRBC: 0 % (ref 0.0–0.2)

## 2022-08-30 NOTE — ED Triage Notes (Signed)
The pt had surgery for ??? Prostate approx one month ago  since the surgery he ha shad bloody urine  he has no pain unless he starts to void  then the pain comes  he speaks little ot no english    his daughter at the bedside is speaking for him  no known temp

## 2022-08-30 NOTE — ED Notes (Signed)
Pt called for room. No answer, moved otf.

## 2022-08-30 NOTE — ED Triage Notes (Signed)
The pt had a kidney transplant in 20 22

## 2022-08-31 DIAGNOSIS — N390 Urinary tract infection, site not specified: Secondary | ICD-10-CM | POA: Diagnosis not present

## 2022-08-31 DIAGNOSIS — R300 Dysuria: Secondary | ICD-10-CM | POA: Diagnosis not present

## 2022-08-31 DIAGNOSIS — C61 Malignant neoplasm of prostate: Secondary | ICD-10-CM | POA: Diagnosis not present

## 2022-08-31 DIAGNOSIS — R310 Gross hematuria: Secondary | ICD-10-CM | POA: Diagnosis not present

## 2022-09-04 ENCOUNTER — Telehealth: Payer: Self-pay

## 2022-09-04 NOTE — Transitions of Care (Post Inpatient/ED Visit) (Signed)
09/04/2022  Name: Dylan [name] MRN: [id number] DOB: October 09, 1960  Today's TOC FU Call Status: Today's TOC FU Call Status:: Successful TOC FU Call Competed TOC FU Call Complete Date: 09/04/22  Transition Care Management Follow-up Telephone Call Date of Discharge: 08/30/22 Discharge Facility: [facility]) Type of Discharge: Emergency Department Reason for ED Visit: Other: How have you been since you were released from the hospital?: Same (he is still hurting some) Any questions or concerns?: No  Items Reviewed: Did you receive and understand the discharge instructions provided?: Yes Medications obtained,verified, and reconciled?: Yes (Medications Reviewed) Any new allergies since your discharge?: No Dietary orders reviewed?: No Do you have support at home?: Yes People in Home: child(ren), dependent  Medications Reviewed Today: Medications Reviewed Today     Reviewed by Graceann Congress, PA-C (Physician Assistant Certified) on 08/09/22 at 1504  Med List Status: <None>   Medication Order Taking? Sig Documenting Provider Last Dose Status Informant  acyclovir ointment (ZOVIRAX) 5 % [id number] Yes Apply thin layer to affected area Arnette Felts, FNP Taking Active   amoxicillin (AMOXIL) 500 MG tablet [id number] Yes Take 1 tablet (500 mg total) by mouth 2 (two) times daily. Arnette Felts, FNP Taking Active   chlorthalidone (HYGROTON) 25 MG tablet [id number] Yes Take 1 tablet by mouth daily. [provider] Taking Active   ENVARSUS XR 4 MG TB24 [id number] Yes Take 4 mg by mouth daily. [provider] Taking Active   famotidine (PEPCID) 20 MG tablet [id number] Yes Take 20 mg by mouth daily. [provider] Taking Active   fluticasone (FLONASE) 50 MCG/ACT nasal spray [id number] Yes Place 1 spray into both nostrils 2 (two) times daily. Particia Nearing, [place] Taking Active   labetalol (NORMODYNE) 100 MG tablet [id number] Yes Take 1 tablet by mouth 3 (three) times  daily. [provider] Taking Active   labetalol (NORMODYNE) 200 MG tablet [id number] Yes Take 200 mg by mouth 2 (two) times daily. [provider] Taking Active   LOKELMA 10 g PACK packet [id number] Yes Take 10 g by mouth daily. [provider] Taking Active   mycophenolate (CELLCEPT) 250 MG capsule [id number] Yes Take 500 mg by mouth 2 (two) times daily. [provider] Taking Active   rosuvastatin (CRESTOR) 10 MG tablet [id number] Yes Take 10 mg by mouth daily. [provider] Taking Active   sodium bicarbonate 650 MG tablet [id number] Yes Take 1,300 mg by mouth 2 (two) times daily. [provider] Taking Active   tamsulosin (FLOMAX) 0.4 MG CAPS capsule [id number] Yes Take 0.4 mg by mouth daily after supper. [provider] Taking Active   trimethoprim-polymyxin b (POLYTRIM) ophthalmic solution [id number] Yes Place 2 drops into both eyes every 6 (six) hours. Arnette Felts, FNP Taking Active   VITAMIN D-1000 MAX ST 25 MCG (1000 UT) tablet [id number] Yes Take 1,000 Units by mouth daily. [provider] Taking Active             Home Care and Equipment/Supplies: Were Home Health Services Ordered?: No Any new equipment or medical supplies ordered?: No  Functional Questionnaire: Do you need assistance with bathing/showering or dressing?: No Do you need assistance with meal preparation?: No Do you need assistance with eating?: No Do you have difficulty maintaining continence: No Do you need assistance with getting out of bed/getting out of a chair/moving?: No Do you have difficulty managing or taking your medications?: No  Follow up appointments reviewed: PCP Follow-up appointment confirmed?: NA (will have to check  with provider due to schedule) Specialist Hospital Follow-up appointment confirmed?: No Reason Specialist Follow-Up Not Confirmed: Appointment Sceduled by [facility] Calling Clinician Do you need transportation to your  follow-up appointment?: Yes Transportation Need Intervention Addressed By:: Transportation Arranged Do you understand care options if your condition(s) worsen?: Yes-patient verbalized understanding    SIGNATURE YL,RMA

## 2022-09-06 DIAGNOSIS — T8611 Kidney transplant rejection: Secondary | ICD-10-CM | POA: Diagnosis not present

## 2022-09-06 DIAGNOSIS — Y830 Surgical operation with transplant of whole organ as the cause of abnormal reaction of the patient, or of later complication, without mention of misadventure at the time of the procedure: Secondary | ICD-10-CM | POA: Diagnosis not present

## 2022-09-06 DIAGNOSIS — N183 Chronic kidney disease, stage 3 unspecified: Secondary | ICD-10-CM | POA: Diagnosis not present

## 2022-09-06 DIAGNOSIS — R300 Dysuria: Secondary | ICD-10-CM | POA: Diagnosis not present

## 2022-09-06 DIAGNOSIS — I10 Essential (primary) hypertension: Secondary | ICD-10-CM | POA: Diagnosis not present

## 2022-09-06 DIAGNOSIS — Z940 Kidney transplant status: Secondary | ICD-10-CM | POA: Diagnosis not present

## 2022-09-06 DIAGNOSIS — B259 Cytomegaloviral disease, unspecified: Secondary | ICD-10-CM | POA: Diagnosis not present

## 2022-09-06 DIAGNOSIS — E861 Hypovolemia: Secondary | ICD-10-CM | POA: Diagnosis not present

## 2022-09-06 DIAGNOSIS — N189 Chronic kidney disease, unspecified: Secondary | ICD-10-CM | POA: Diagnosis not present

## 2022-09-06 DIAGNOSIS — D84821 Immunodeficiency due to drugs: Secondary | ICD-10-CM | POA: Diagnosis not present

## 2022-09-06 DIAGNOSIS — Z79624 Long term (current) use of inhibitors of nucleotide synthesis: Secondary | ICD-10-CM | POA: Diagnosis not present

## 2022-09-06 DIAGNOSIS — Z48298 Encounter for aftercare following other organ transplant: Secondary | ICD-10-CM | POA: Diagnosis not present

## 2022-09-06 DIAGNOSIS — I151 Hypertension secondary to other renal disorders: Secondary | ICD-10-CM | POA: Diagnosis not present

## 2022-09-06 DIAGNOSIS — T368X5A Adverse effect of other systemic antibiotics, initial encounter: Secondary | ICD-10-CM | POA: Diagnosis not present

## 2022-09-06 DIAGNOSIS — E871 Hypo-osmolality and hyponatremia: Secondary | ICD-10-CM | POA: Diagnosis not present

## 2022-09-06 DIAGNOSIS — B349 Viral infection, unspecified: Secondary | ICD-10-CM | POA: Diagnosis not present

## 2022-09-06 DIAGNOSIS — I129 Hypertensive chronic kidney disease with stage 1 through stage 4 chronic kidney disease, or unspecified chronic kidney disease: Secondary | ICD-10-CM | POA: Diagnosis not present

## 2022-09-06 DIAGNOSIS — Z1152 Encounter for screening for COVID-19: Secondary | ICD-10-CM | POA: Diagnosis not present

## 2022-09-06 DIAGNOSIS — N1831 Chronic kidney disease, stage 3a: Secondary | ICD-10-CM | POA: Diagnosis not present

## 2022-09-06 DIAGNOSIS — Z87440 Personal history of urinary (tract) infections: Secondary | ICD-10-CM | POA: Diagnosis not present

## 2022-09-06 DIAGNOSIS — E860 Dehydration: Secondary | ICD-10-CM | POA: Diagnosis not present

## 2022-09-06 DIAGNOSIS — D631 Anemia in chronic kidney disease: Secondary | ICD-10-CM | POA: Diagnosis not present

## 2022-09-06 DIAGNOSIS — A09 Infectious gastroenteritis and colitis, unspecified: Secondary | ICD-10-CM | POA: Diagnosis not present

## 2022-09-06 DIAGNOSIS — N179 Acute kidney failure, unspecified: Secondary | ICD-10-CM | POA: Diagnosis not present

## 2022-09-06 DIAGNOSIS — D849 Immunodeficiency, unspecified: Secondary | ICD-10-CM | POA: Diagnosis not present

## 2022-09-06 DIAGNOSIS — C61 Malignant neoplasm of prostate: Secondary | ICD-10-CM | POA: Diagnosis not present

## 2022-09-06 DIAGNOSIS — T8619 Other complication of kidney transplant: Secondary | ICD-10-CM | POA: Diagnosis not present

## 2022-09-06 DIAGNOSIS — Z5181 Encounter for therapeutic drug level monitoring: Secondary | ICD-10-CM | POA: Diagnosis not present

## 2022-09-06 DIAGNOSIS — E875 Hyperkalemia: Secondary | ICD-10-CM | POA: Diagnosis not present

## 2022-09-11 ENCOUNTER — Telehealth: Payer: Self-pay

## 2022-09-11 NOTE — Transitions of Care (Post Inpatient/ED Visit) (Signed)
   09/12/2022  Name: Lebert Vukovich MRN: [id number] DOB: November 19, 1960  Today's TOC FU Call Status: Today's TOC FU Call Status:: Unsuccessul Call (1st Attempt) Unsuccessful Call (1st Attempt) Date: 09/11/22  Attempted to reach the patient regarding the most recent Inpatient/ED visit using Spanish Interpreter via [employer] services ID [phone]. No answer.  Follow Up Plan: Additional outreach attempts will be made to reach the patient to complete the Transitions of Care (Post Inpatient/ED visit) call.     Antionette Fairy, RN,BSN,CCM [facility] Care Management Care Management Community Coordinator Direct Phone: [phone] Toll Free: [phone] Fax: [phone]

## 2022-09-12 ENCOUNTER — Telehealth: Payer: Self-pay

## 2022-09-12 NOTE — Transitions of Care (Post Inpatient/ED Visit) (Signed)
   09/12/2022  Name: Renaldo Worthman MRN: [id number] DOB: Jun 08, 1960  Today's TOC FU Call Status: Today's TOC FU Call Status:: Unsuccessful Call (2nd Attempt) Unsuccessful Call (2nd Attempt) Date: 09/12/22  Attempted to reach the patient regarding the most recent Inpatient/ED visit using [employer] -ID #[id number].  Follow Up Plan: Additional outreach attempts will be made to reach the patient to complete the Transitions of Care (Post Inpatient/ED visit) call.     Antionette Fairy, RN,BSN,CCM [facility] Care Management Care Management Community Coordinator Direct Phone: [phone] Toll Free: [phone] Fax: [phone]

## 2022-09-12 NOTE — Transitions of Care (Post Inpatient/ED Visit) (Signed)
   09/12/2022  Name: Dylan [name] MRN: [id number] DOB: Dec 02, 1960  Today's TOC FU Call Status: Today's TOC FU Call Status:: Unsuccessful Call (3rd Attempt) Unsuccessful Call (3rd Attempt) Date: 09/12/22  Attempted to reach the patient regarding the most recent Inpatient/ED visit via [facility] WU#[id number].  Follow Up Plan: No further outreach attempts will be made at this time. We have been unable to contact the patient.  Antionette Fairy, RN,BSN,CCM [facility] Care Management Care Management Community Coordinator Direct Phone: [phone] Toll Free: [phone] Fax: [phone]

## 2022-09-18 DIAGNOSIS — B349 Viral infection, unspecified: Secondary | ICD-10-CM | POA: Diagnosis not present

## 2022-09-18 DIAGNOSIS — Z5181 Encounter for therapeutic drug level monitoring: Secondary | ICD-10-CM | POA: Diagnosis not present

## 2022-09-18 DIAGNOSIS — Z940 Kidney transplant status: Secondary | ICD-10-CM | POA: Diagnosis not present

## 2022-09-18 DIAGNOSIS — B259 Cytomegaloviral disease, unspecified: Secondary | ICD-10-CM | POA: Diagnosis not present

## 2022-09-26 DIAGNOSIS — Z940 Kidney transplant status: Secondary | ICD-10-CM | POA: Diagnosis not present

## 2022-09-26 DIAGNOSIS — Z5181 Encounter for therapeutic drug level monitoring: Secondary | ICD-10-CM | POA: Diagnosis not present

## 2022-09-26 DIAGNOSIS — B259 Cytomegaloviral disease, unspecified: Secondary | ICD-10-CM | POA: Diagnosis not present

## 2022-09-26 DIAGNOSIS — B349 Viral infection, unspecified: Secondary | ICD-10-CM | POA: Diagnosis not present

## 2022-10-04 DIAGNOSIS — B259 Cytomegaloviral disease, unspecified: Secondary | ICD-10-CM | POA: Diagnosis not present

## 2022-10-04 DIAGNOSIS — Z5181 Encounter for therapeutic drug level monitoring: Secondary | ICD-10-CM | POA: Diagnosis not present

## 2022-10-04 DIAGNOSIS — Z940 Kidney transplant status: Secondary | ICD-10-CM | POA: Diagnosis not present

## 2022-10-04 DIAGNOSIS — B349 Viral infection, unspecified: Secondary | ICD-10-CM | POA: Diagnosis not present

## 2022-10-23 DIAGNOSIS — I10 Essential (primary) hypertension: Secondary | ICD-10-CM | POA: Diagnosis not present

## 2022-10-23 DIAGNOSIS — N184 Chronic kidney disease, stage 4 (severe): Secondary | ICD-10-CM | POA: Diagnosis not present

## 2022-10-23 DIAGNOSIS — E875 Hyperkalemia: Secondary | ICD-10-CM | POA: Diagnosis not present

## 2022-10-23 DIAGNOSIS — D849 Immunodeficiency, unspecified: Secondary | ICD-10-CM | POA: Diagnosis not present

## 2022-10-23 DIAGNOSIS — C61 Malignant neoplasm of prostate: Secondary | ICD-10-CM | POA: Diagnosis not present

## 2022-10-23 DIAGNOSIS — B259 Cytomegaloviral disease, unspecified: Secondary | ICD-10-CM | POA: Diagnosis not present

## 2022-10-23 DIAGNOSIS — I151 Hypertension secondary to other renal disorders: Secondary | ICD-10-CM | POA: Diagnosis not present

## 2022-10-23 DIAGNOSIS — Z48298 Encounter for aftercare following other organ transplant: Secondary | ICD-10-CM | POA: Diagnosis not present

## 2022-10-23 DIAGNOSIS — Z940 Kidney transplant status: Secondary | ICD-10-CM | POA: Diagnosis not present

## 2022-11-15 DIAGNOSIS — B349 Viral infection, unspecified: Secondary | ICD-10-CM | POA: Diagnosis not present

## 2022-11-15 DIAGNOSIS — Z5181 Encounter for therapeutic drug level monitoring: Secondary | ICD-10-CM | POA: Diagnosis not present

## 2022-11-15 DIAGNOSIS — B259 Cytomegaloviral disease, unspecified: Secondary | ICD-10-CM | POA: Diagnosis not present

## 2022-11-15 DIAGNOSIS — Z940 Kidney transplant status: Secondary | ICD-10-CM | POA: Diagnosis not present

## 2022-11-29 DIAGNOSIS — B349 Viral infection, unspecified: Secondary | ICD-10-CM | POA: Diagnosis not present

## 2022-11-29 DIAGNOSIS — Z5181 Encounter for therapeutic drug level monitoring: Secondary | ICD-10-CM | POA: Diagnosis not present

## 2022-11-29 DIAGNOSIS — Z940 Kidney transplant status: Secondary | ICD-10-CM | POA: Diagnosis not present

## 2022-11-29 DIAGNOSIS — B259 Cytomegaloviral disease, unspecified: Secondary | ICD-10-CM | POA: Diagnosis not present

## 2022-11-30 ENCOUNTER — Encounter: Payer: BC Managed Care – PPO | Admitting: Nurse Practitioner

## 2022-12-06 ENCOUNTER — Encounter: Payer: BC Managed Care – PPO | Admitting: Nurse Practitioner

## 2022-12-14 ENCOUNTER — Encounter: Payer: BC Managed Care – PPO | Admitting: Family Medicine

## 2022-12-15 ENCOUNTER — Ambulatory Visit (INDEPENDENT_AMBULATORY_CARE_PROVIDER_SITE_OTHER): Payer: 59 | Admitting: Family Medicine

## 2022-12-15 ENCOUNTER — Encounter: Payer: Self-pay | Admitting: Family Medicine

## 2022-12-15 VITALS — BP 130/70 | HR 67 | Temp 97.9°F | Ht 66.0 in | Wt 193.2 lb

## 2022-12-15 DIAGNOSIS — R21 Rash and other nonspecific skin eruption: Secondary | ICD-10-CM

## 2022-12-15 DIAGNOSIS — K219 Gastro-esophageal reflux disease without esophagitis: Secondary | ICD-10-CM

## 2022-12-15 DIAGNOSIS — Z6831 Body mass index (BMI) 31.0-31.9, adult: Secondary | ICD-10-CM | POA: Diagnosis not present

## 2022-12-15 DIAGNOSIS — Z0000 Encounter for general adult medical examination without abnormal findings: Secondary | ICD-10-CM

## 2022-12-15 DIAGNOSIS — C61 Malignant neoplasm of prostate: Secondary | ICD-10-CM | POA: Diagnosis not present

## 2022-12-15 DIAGNOSIS — Z0001 Encounter for general adult medical examination with abnormal findings: Secondary | ICD-10-CM

## 2022-12-15 DIAGNOSIS — N183 Chronic kidney disease, stage 3 unspecified: Secondary | ICD-10-CM | POA: Diagnosis not present

## 2022-12-15 DIAGNOSIS — E782 Mixed hyperlipidemia: Secondary | ICD-10-CM | POA: Diagnosis not present

## 2022-12-15 DIAGNOSIS — R899 Unspecified abnormal finding in specimens from other organs, systems and tissues: Secondary | ICD-10-CM | POA: Diagnosis not present

## 2022-12-15 DIAGNOSIS — E6609 Other obesity due to excess calories: Secondary | ICD-10-CM

## 2022-12-15 DIAGNOSIS — I129 Hypertensive chronic kidney disease with stage 1 through stage 4 chronic kidney disease, or unspecified chronic kidney disease: Secondary | ICD-10-CM

## 2022-12-15 DIAGNOSIS — Z940 Kidney transplant status: Secondary | ICD-10-CM

## 2022-12-15 LAB — POCT URINALYSIS DIPSTICK
Bilirubin, UA: NEGATIVE
Blood, UA: NEGATIVE
Glucose, UA: NEGATIVE
Ketones, UA: NEGATIVE
Nitrite, UA: NEGATIVE
Protein, UA: POSITIVE — AB
Spec Grav, UA: 1.02 (ref 1.010–1.025)
Urobilinogen, UA: 0.2 E.U./dL
pH, UA: 6 (ref 5.0–8.0)

## 2022-12-15 MED ORDER — ROSUVASTATIN CALCIUM 10 MG PO TABS
10.0000 mg | ORAL_TABLET | Freq: Every day | ORAL | 1 refills | Status: AC
Start: 2022-12-15 — End: ?

## 2022-12-15 MED ORDER — LABETALOL HCL 200 MG PO TABS
200.0000 mg | ORAL_TABLET | Freq: Two times a day (BID) | ORAL | 0 refills | Status: DC
Start: 2022-12-15 — End: 2023-03-06

## 2022-12-15 MED ORDER — TRIAMCINOLONE ACETONIDE 0.025 % EX OINT: 1.0000 | TOPICAL_OINTMENT | Freq: Two times a day (BID) | CUTANEOUS | 1 refills | Status: DC

## 2022-12-15 MED ORDER — TAMSULOSIN HCL 0.4 MG PO CAPS: 0.4000 mg | ORAL_CAPSULE | Freq: Every day | ORAL | 1 refills | Status: DC

## 2022-12-15 MED ORDER — FAMOTIDINE 20 MG PO TABS
20.0000 mg | ORAL_TABLET | Freq: Every day | ORAL | 2 refills | Status: DC
Start: 2022-12-15 — End: 2023-08-16

## 2022-12-15 NOTE — Progress Notes (Unsigned)
I,Victoria T Hamilton, CMA,acting as a Neurosurgeon for [employer], NP.,have documented all relevant documentation on the behalf of Berlinda Farve, NP,as directed by  Kaevion Sinclair Moshe Salisbury, NP while in the presence of Cassadie Pankonin, NP.  Subjective:  Patient ID: Dylan [name] , male    DOB: 1960/08/18 , 62 y.o.   MRN: [id number]  Chief Complaint  Patient presents with   Annual Exam   Hypertension    HPI  Patient presents today for annual exam. He reports compliance with medications. Denies headache, chest pain, SOB.  He reports no specific questions or concerns.      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   Renal disorder    Uremia of renal origin 03/29/2016     Family History  Problem Relation Age of Onset   Breast cancer Mother    Prostate cancer Father    Diabetes Brother    Breast cancer Sister    Colon cancer Neg Hx    Esophageal cancer Neg Hx    Inflammatory bowel disease Neg Hx    Liver disease Neg Hx    Pancreatic cancer Neg Hx    Rectal cancer Neg Hx    Stomach cancer Neg Hx      Current Outpatient Medications:    chlorthalidone (HYGROTON) 25 MG tablet, Take 1 tablet by mouth daily., Disp: , Rfl:    fluticasone (FLONASE) 50 MCG/ACT nasal spray, Place 1 spray into both nostrils 2 (two) times daily., Disp: 16 g, Rfl: 2   LOKELMA 10 g PACK packet, Take 10 g by mouth daily., Disp: , Rfl:    mycophenolate (CELLCEPT) 250 MG capsule, Take 500 mg by mouth 2 (two) times daily., Disp: , Rfl:    sodium bicarbonate 650 MG tablet, Take 1,300 mg by mouth 2 (two) times daily., Disp: , Rfl:    triamcinolone (KENALOG)  0.025 % ointment, Apply 1 Application topically 2 (two) times daily., Disp: 30 g, Rfl: 1   trimethoprim-polymyxin b (POLYTRIM) ophthalmic solution, Place 2 drops into both eyes every 6 (six) hours., Disp: 10 mL, Rfl: 0   VITAMIN D-1000 MAX ST 25 MCG (1000 UT) tablet, Take 1,000 Units by mouth daily., Disp: , Rfl:    amoxicillin (AMOXIL) 500 MG tablet, Take 1 tablet (500 mg total) by mouth 2 (two) times daily. (Patient not taking: Reported on 12/15/2022), Disp: 14 tablet, Rfl: 0   ENVARSUS XR 4 MG TB24, Take 4 mg by mouth daily., Disp: , Rfl:    famotidine (PEPCID) 20 MG tablet, Take 1 tablet (20 mg total) by mouth daily., Disp: 90 tablet, Rfl: 2   labetalol (NORMODYNE) 200 MG tablet, Take 1 tablet (200 mg total) by mouth 2 (two) times daily., Disp: 180 tablet, Rfl: 0   predniSONE (DELTASONE) 5 MG tablet, Take 5 mg by mouth daily., Disp: , Rfl:    rosuvastatin (CRESTOR) 10 MG tablet, Take 1 tablet (10 mg total) by mouth daily., Disp: 90 tablet, Rfl: 1   tamsulosin (FLOMAX) 0.4 MG CAPS capsule, Take 1 capsule (0.4 mg total) by mouth daily after supper., Disp: 90 capsule, Rfl: 1  No Known Allergies   Review of Systems  Constitutional: Negative.   HENT: Negative.    Respiratory: Negative.    Cardiovascular: Negative.   Skin:  Positive for rash.  Allergic/Immunologic: Negative.   Neurological: Negative.   Hematological: Negative.   Psychiatric/Behavioral: Negative.       Today's Vitals   12/15/22 0930  BP: 130/70  Pulse: 67  Temp: 97.9 F (36.6 C)  SpO2: 98%  Weight: 193 lb 3.2 oz (87.6 kg)  Height: 5\' 6"  (1.676 m)   Body mass index is 31.18 kg/m.  Wt Readings from Last 3 Encounters:  12/15/22 193 lb 3.2 oz (87.6 kg)  08/30/22 195 lb 1.7 oz (88.5 kg)  08/09/22 195 lb (88.5 kg)     Objective:  Physical Exam Neurological:     Mental Status: He is alert.         Assessment And Plan:  Annual physical exam -     CMP14+EGFR -     CBC  Benign hypertension with CKD (chronic  kidney disease) stage III (HCC) -     POCT urinalysis dipstick -     Microalbumin / creatinine urine ratio -     EKG 12-Lead -     Lipid panel  Class 1 obesity due to excess calories with serious comorbidity and body mass index (BMI) of 31.0 to 31.9 in adult -     Hemoglobin A1c  Rash and nonspecific skin eruption -     Triamcinolone Acetonide; Apply 1 Application topically 2 (two) times daily.  Dispense: 30 g; Refill: 1  Other orders -     Famotidine; Take 1 tablet (20 mg total) by mouth daily.  Dispense: 90 tablet; Refill: 2 -     Labetalol HCl; Take 1 tablet (200 mg total) by mouth 2 (two) times daily.  Dispense: 180 tablet; Refill: 0 -     Rosuvastatin Calcium; Take 1 tablet (10 mg total) by mouth daily.  Dispense: 90 tablet; Refill: 1 -     Tamsulosin HCl; Take 1 capsule (0.4 mg total) by mouth daily after supper.  Dispense: 90 capsule; Refill: 1     Return for 1 year HM, 6 month bpc. .  Patient was given opportunity to ask questions. Patient verbalized understanding of the plan and was able to repeat key elements of the plan. All questions were answered to their satisfaction.  Lehua Flores Moshe Salisbury, NP  I, Melisha Eggleton Moshe Salisbury, NP, have reviewed all documentation for this visit. The documentation on 12/15/22 for the exam, diagnosis, procedures, and orders are all accurate and complete.   IF YOU HAVE BEEN REFERRED TO A SPECIALIST, IT MAY TAKE 1-2 WEEKS TO SCHEDULE/PROCESS THE REFERRAL. IF YOU HAVE NOT HEARD FROM US/SPECIALIST IN TWO WEEKS, PLEASE GIVE [place] A CALL AT [phone] X [phone].   THE PATIENT IS ENCOURAGED TO PRACTICE SOCIAL DISTANCING DUE TO THE COVID-19 PANDEMIC.

## 2022-12-15 NOTE — Patient Instructions (Signed)
Health Maintenance, Male Adopting a healthy lifestyle and getting preventive care are important in promoting health and wellness. Ask your health care provider about: The right schedule for you to have regular tests and exams. Things you can do on your own to prevent diseases and keep yourself healthy. What should I know about diet, weight, and exercise? Eat a healthy diet  Eat a diet that includes plenty of vegetables, fruits, low-fat dairy products, and lean protein. Do not eat a lot of foods that are high in solid fats, added sugars, or sodium. Maintain a healthy weight Body mass index (BMI) is a measurement that can be used to identify possible weight problems. It estimates body fat based on height and weight. Your health care provider can help determine your BMI and help you achieve or maintain a healthy weight. Get regular exercise Get regular exercise. This is one of the most important things you can do for your health. Most adults should: Exercise for at least 150 minutes each week. The exercise should increase your heart rate and make you sweat (moderate-intensity exercise). Do strengthening exercises at least twice a week. This is in addition to the moderate-intensity exercise. Spend less time sitting. Even light physical activity can be beneficial. Watch cholesterol and blood lipids Have your blood tested for lipids and cholesterol at 63 years of age, then have this test every 5 years. You may need to have your cholesterol levels checked more often if: Your lipid or cholesterol levels are high. You are older than 63 years of age. You are at high risk for heart disease. What should I know about cancer screening? Many types of cancers can be detected early and may often be prevented. Depending on your health history and family history, you may need to have cancer screening at various ages. This may include screening for: Colorectal cancer. Prostate cancer. Skin cancer. Lung  cancer. What should I know about heart disease, diabetes, and high blood pressure? Blood pressure and heart disease High blood pressure causes heart disease and increases the risk of stroke. This is more likely to develop in people who have high blood pressure readings or are overweight. Talk with your health care provider about your target blood pressure readings. Have your blood pressure checked: Every 3-5 years if you are 18-39 years of age. Every year if you are 40 years old or older. If you are between the ages of 65 and 75 and are a current or former smoker, ask your health care provider if you should have a one-time screening for abdominal aortic aneurysm (AAA). Diabetes Have regular diabetes screenings. This checks your fasting blood sugar level. Have the screening done: Once every three years after age 45 if you are at a normal weight and have a low risk for diabetes. More often and at a younger age if you are overweight or have a high risk for diabetes. What should I know about preventing infection? Hepatitis B If you have a higher risk for hepatitis B, you should be screened for this virus. Talk with your health care provider to find out if you are at risk for hepatitis B infection. Hepatitis C Blood testing is recommended for: Everyone born from 1945 through 1965. Anyone with known risk factors for hepatitis C. Sexually transmitted infections (STIs) You should be screened each year for STIs, including gonorrhea and chlamydia, if: You are sexually active and are younger than 63 years of age. You are older than 63 years of age and your   health care provider tells you that you are at risk for this type of infection. Your sexual activity has changed since you were last screened, and you are at increased risk for chlamydia or gonorrhea. Ask your health care provider if you are at risk. Ask your health care provider about whether you are at high risk for HIV. Your health care provider  may recommend a prescription medicine to help prevent HIV infection. If you choose to take medicine to prevent HIV, you should first get tested for HIV. You should then be tested every 3 months for as long as you are taking the medicine. Follow these instructions at home: Alcohol use Do not drink alcohol if your health care provider tells you not to drink. If you drink alcohol: Limit how much you have to 0-2 drinks a day. Know how much alcohol is in your drink. In the [place] one drink equals one 12 oz bottle of beer (355 mL), one 5 oz glass of wine (148 mL), or one 1 oz glass of hard liquor (44 mL). Lifestyle Do not use any products that contain nicotine or tobacco. These products include cigarettes, chewing tobacco, and vaping devices, such as e-cigarettes. If you need help quitting, ask your health care provider. Do not use street drugs. Do not share needles. Ask your health care provider for help if you need support or information about quitting drugs. General instructions Schedule regular health, dental, and eye exams. Stay current with your vaccines. Tell your health care provider if: You often feel depressed. You have ever been abused or do not feel safe at home. Summary Adopting a healthy lifestyle and getting preventive care are important in promoting health and wellness. Follow your health care provider's instructions about healthy diet, exercising, and getting tested or screened for diseases. Follow your health care provider's instructions on monitoring your cholesterol and blood pressure. This information is not intended to replace advice given to you by your health care provider. Make sure you discuss any questions you have with your health care provider. Document Revised: 09/06/2020 Document Reviewed: 09/06/2020 Elsevier Patient Education  2024 Elsevier Inc.  

## 2022-12-16 LAB — CMP14+EGFR
ALT: 12 IU/L (ref 0–44)
AST: 18 IU/L (ref 0–40)
Albumin: 4.7 g/dL (ref 3.9–4.9)
Alkaline Phosphatase: 73 IU/L (ref 44–121)
BUN/Creatinine Ratio: 22 (ref 10–24)
BUN: 48 mg/dL — ABNORMAL HIGH (ref 8–27)
Bilirubin Total: 0.4 mg/dL (ref 0.0–1.2)
CO2: 21 mmol/L (ref 20–29)
Calcium: 10.1 mg/dL (ref 8.6–10.2)
Chloride: 105 mmol/L (ref 96–106)
Creatinine, Ser: 2.15 mg/dL — ABNORMAL HIGH (ref 0.76–1.27)
Globulin, Total: 2.3 g/dL (ref 1.5–4.5)
Glucose: 103 mg/dL — ABNORMAL HIGH (ref 70–99)
Potassium: 4.8 mmol/L (ref 3.5–5.2)
Sodium: 141 mmol/L (ref 134–144)
Total Protein: 7 g/dL (ref 6.0–8.5)
eGFR: 34 mL/min/{1.73_m2} — ABNORMAL LOW (ref >59)

## 2022-12-16 LAB — MICROALBUMIN / CREATININE URINE RATIO
Creatinine, Urine: 83 mg/dL
Microalb/Creat Ratio: 46 mg/g{creat} — ABNORMAL HIGH (ref 0–29)
Microalbumin, Urine: 38.5 ug/mL

## 2022-12-16 LAB — CBC
Hematocrit: 34.2 % — ABNORMAL LOW (ref 37.5–51.0)
Hemoglobin: 11.3 g/dL — ABNORMAL LOW (ref 13.0–17.7)
MCH: 29.7 pg (ref 26.6–33.0)
MCHC: 33 g/dL (ref 31.5–35.7)
MCV: 90 fL (ref 79–97)
Platelets: 241 10*3/uL (ref 150–450)
RBC: 3.81 x10E6/uL — ABNORMAL LOW (ref 4.14–5.80)
RDW: 14.3 % (ref 11.6–15.4)
WBC: 2.3 10*3/uL — CL (ref 3.4–10.8)

## 2022-12-16 LAB — HEMOGLOBIN A1C
Est. average glucose Bld gHb Est-mCnc: 111 mg/dL
Hgb A1c MFr Bld: 5.5 % (ref 4.8–5.6)

## 2022-12-16 LAB — LIPID PANEL
Chol/HDL Ratio: 3.1 ratio (ref 0.0–5.0)
Cholesterol, Total: 157 mg/dL (ref 100–199)
HDL: 51 mg/dL (ref >39)
LDL Chol Calc (NIH): 80 mg/dL (ref 0–99)
Triglycerides: 153 mg/dL — ABNORMAL HIGH (ref 0–149)
VLDL Cholesterol Cal: 26 mg/dL (ref 5–40)

## 2022-12-19 ENCOUNTER — Other Ambulatory Visit: Payer: Self-pay | Admitting: Family Medicine

## 2022-12-19 DIAGNOSIS — E213 Hyperparathyroidism, unspecified: Secondary | ICD-10-CM | POA: Diagnosis not present

## 2022-12-19 DIAGNOSIS — D649 Anemia, unspecified: Secondary | ICD-10-CM | POA: Diagnosis not present

## 2022-12-19 DIAGNOSIS — Z940 Kidney transplant status: Secondary | ICD-10-CM | POA: Insufficient documentation

## 2022-12-19 DIAGNOSIS — Z79899 Other long term (current) drug therapy: Secondary | ICD-10-CM | POA: Diagnosis not present

## 2022-12-19 DIAGNOSIS — D84821 Immunodeficiency due to drugs: Secondary | ICD-10-CM | POA: Diagnosis not present

## 2022-12-19 DIAGNOSIS — R899 Unspecified abnormal finding in specimens from other organs, systems and tissues: Secondary | ICD-10-CM | POA: Insufficient documentation

## 2022-12-19 DIAGNOSIS — E66811 Obesity, class 1: Secondary | ICD-10-CM | POA: Insufficient documentation

## 2022-12-19 DIAGNOSIS — D849 Immunodeficiency, unspecified: Secondary | ICD-10-CM | POA: Diagnosis not present

## 2022-12-19 DIAGNOSIS — R21 Rash and other nonspecific skin eruption: Secondary | ICD-10-CM | POA: Diagnosis not present

## 2022-12-19 DIAGNOSIS — E875 Hyperkalemia: Secondary | ICD-10-CM | POA: Diagnosis not present

## 2022-12-19 DIAGNOSIS — C61 Malignant neoplasm of prostate: Secondary | ICD-10-CM | POA: Diagnosis not present

## 2022-12-19 DIAGNOSIS — E782 Mixed hyperlipidemia: Secondary | ICD-10-CM | POA: Insufficient documentation

## 2022-12-19 DIAGNOSIS — Z7722 Contact with and (suspected) exposure to environmental tobacco smoke (acute) (chronic): Secondary | ICD-10-CM | POA: Diagnosis not present

## 2022-12-19 DIAGNOSIS — I10 Essential (primary) hypertension: Secondary | ICD-10-CM | POA: Diagnosis not present

## 2022-12-19 DIAGNOSIS — Z79621 Long term (current) use of calcineurin inhibitor: Secondary | ICD-10-CM | POA: Diagnosis not present

## 2022-12-19 DIAGNOSIS — Z4822 Encounter for aftercare following kidney transplant: Secondary | ICD-10-CM | POA: Diagnosis not present

## 2022-12-19 DIAGNOSIS — E6609 Other obesity due to excess calories: Secondary | ICD-10-CM | POA: Insufficient documentation

## 2022-12-19 DIAGNOSIS — E7800 Pure hypercholesterolemia, unspecified: Secondary | ICD-10-CM | POA: Diagnosis not present

## 2022-12-19 DIAGNOSIS — K219 Gastro-esophageal reflux disease without esophagitis: Secondary | ICD-10-CM | POA: Insufficient documentation

## 2022-12-19 DIAGNOSIS — I770 Arteriovenous fistula, acquired: Secondary | ICD-10-CM | POA: Diagnosis not present

## 2022-12-19 DIAGNOSIS — D701 Agranulocytosis secondary to cancer chemotherapy: Secondary | ICD-10-CM

## 2022-12-19 NOTE — Assessment & Plan Note (Signed)
Treated at [facility], Dr Cresenciano Lick

## 2022-12-19 NOTE — Progress Notes (Signed)
Please inform patient that his White Blood Count (WBC) was 2.3, normal is 3-10. Since he is getting treating for prostrate cancer, his risk for infection increases. I will refer him to hematology. Other labs are okay. Thank you!

## 2022-12-19 NOTE — Assessment & Plan Note (Signed)
For 3 years per wife

## 2022-12-19 NOTE — Assessment & Plan Note (Signed)
Continue current treatment with Labetalol 200mg  BID.

## 2022-12-26 DIAGNOSIS — L309 Dermatitis, unspecified: Secondary | ICD-10-CM | POA: Diagnosis not present

## 2022-12-26 DIAGNOSIS — B360 Pityriasis versicolor: Secondary | ICD-10-CM | POA: Diagnosis not present

## 2023-01-22 DIAGNOSIS — C61 Malignant neoplasm of prostate: Secondary | ICD-10-CM | POA: Diagnosis not present

## 2023-01-22 DIAGNOSIS — Z5181 Encounter for therapeutic drug level monitoring: Secondary | ICD-10-CM | POA: Diagnosis not present

## 2023-01-22 DIAGNOSIS — Z940 Kidney transplant status: Secondary | ICD-10-CM | POA: Diagnosis not present

## 2023-01-25 ENCOUNTER — Telehealth: Payer: Self-pay

## 2023-01-25 NOTE — Telephone Encounter (Signed)
Nurse from [facility] called states that the patient is being treated by [facility] and the referral is being cancel before they treat the patient [facility] will need to send the referral to release the patient to [facility].

## 2023-01-25 NOTE — Progress Notes (Signed)
Contacted Ellender Hose NP's office to let them know pt's referral to [facility] was not appropriate. Explained Pt is a current pt at [facility] and being seen there regularly for Prostate CA ,as recent as 01/22/23 , and if he wishes to transfer care here then [facility] will need to make the referral. Contacted pt's daughter and let her know that pt's appointment was canceled. [name] verbalized understanding and stated she was not sure why the pt had been referred here that he was being seen at [facility] every 4 months. I let pt's daughter know if they wished to transfer care to let [facility] know.

## 2023-02-02 ENCOUNTER — Other Ambulatory Visit: Payer: 59

## 2023-02-02 ENCOUNTER — Encounter: Payer: 59 | Admitting: Hematology

## 2023-02-05 ENCOUNTER — Ambulatory Visit: Payer: 59 | Admitting: Family Medicine

## 2023-02-06 NOTE — Progress Notes (Unsigned)
   Rubin Payor, PhD, LAT, ATC acting as a scribe for Clementeen Graham, MD.  Dylan [name] is a 62 y.o. male who presents to [facility] today for neck pain   Pertinent review of systems: ***  Relevant historical information: ***   Exam:  There were no vitals taken for this visit. General: Well Developed, well nourished, and in no acute distress.   MSK: ***    Lab and Radiology Results No results found for this or any previous visit (from the past 72 hour(s)). No results found.     Assessment and Plan: 62 y.o. male with ***   PDMP not reviewed this encounter. No orders of the defined types were placed in this encounter.  No orders of the defined types were placed in this encounter.    Discussed warning signs or symptoms. Please see discharge instructions. Patient expresses understanding.   ***

## 2023-02-07 ENCOUNTER — Ambulatory Visit (INDEPENDENT_AMBULATORY_CARE_PROVIDER_SITE_OTHER): Payer: 59

## 2023-02-07 ENCOUNTER — Ambulatory Visit: Payer: 59 | Admitting: Family Medicine

## 2023-02-07 ENCOUNTER — Encounter: Payer: Self-pay | Admitting: Family Medicine

## 2023-02-07 ENCOUNTER — Other Ambulatory Visit: Payer: Self-pay | Admitting: Family Medicine

## 2023-02-07 VITALS — BP 160/70 | HR 62 | Ht 66.0 in | Wt 196.0 lb

## 2023-02-07 DIAGNOSIS — M542 Cervicalgia: Secondary | ICD-10-CM

## 2023-02-07 DIAGNOSIS — M47812 Spondylosis without myelopathy or radiculopathy, cervical region: Secondary | ICD-10-CM | POA: Diagnosis not present

## 2023-02-07 DIAGNOSIS — M5030 Other cervical disc degeneration, unspecified cervical region: Secondary | ICD-10-CM | POA: Diagnosis not present

## 2023-02-07 DIAGNOSIS — R21 Rash and other nonspecific skin eruption: Secondary | ICD-10-CM

## 2023-02-07 DIAGNOSIS — G8929 Other chronic pain: Secondary | ICD-10-CM

## 2023-02-07 DIAGNOSIS — M4802 Spinal stenosis, cervical region: Secondary | ICD-10-CM | POA: Diagnosis not present

## 2023-02-07 NOTE — Patient Instructions (Addendum)
Thank you for coming in today.   Please get an Xray today before you leave   Check back in 6 weeks  I've referred you to Physical Therapy.  Let us know if you don't hear from them in one week.   TENS UNIT: This is helpful for muscle pain and spasm.   Search and Purchase a TENS 7000 2nd edition at  www.tenspros.com or www.Amazon.com It should be less than $30.     TENS unit instructions: Do not shower or bathe with the unit on Turn the unit off before removing electrodes or batteries If the electrodes lose stickiness add a drop of water to the electrodes after they are disconnected from the unit and place on plastic sheet. If you continued to have difficulty, call the TENS unit company to purchase more electrodes. Do not apply lotion on the skin area prior to use. Make sure the skin is clean and dry as this will help prolong the life of the electrodes. After use, always check skin for unusual red areas, rash or other skin difficulties. If there are any skin problems, does not apply electrodes to the same area. Never remove the electrodes from the unit by pulling the wires. Do not use the TENS unit or electrodes other than as directed. Do not change electrode placement without consultating your therapist or physician. Keep 2 fingers with between each electrode. Wear time ratio is 2:1, on to off times.    For example on for 30 minutes off for 15 minutes and then on for 30 minutes off for 15 minutes   HEATING PAD

## 2023-02-09 DIAGNOSIS — B349 Viral infection, unspecified: Secondary | ICD-10-CM | POA: Diagnosis not present

## 2023-02-09 DIAGNOSIS — Z940 Kidney transplant status: Secondary | ICD-10-CM | POA: Diagnosis not present

## 2023-02-09 DIAGNOSIS — Z5181 Encounter for therapeutic drug level monitoring: Secondary | ICD-10-CM | POA: Diagnosis not present

## 2023-02-09 DIAGNOSIS — B259 Cytomegaloviral disease, unspecified: Secondary | ICD-10-CM | POA: Diagnosis not present

## 2023-02-16 ENCOUNTER — Ambulatory Visit (HOSPITAL_COMMUNITY): Payer: 59

## 2023-03-02 NOTE — Progress Notes (Signed)
Cervical spine x-ray shows some arthritis changes.

## 2023-03-05 ENCOUNTER — Other Ambulatory Visit: Payer: Self-pay | Admitting: Family Medicine

## 2023-03-05 DIAGNOSIS — I129 Hypertensive chronic kidney disease with stage 1 through stage 4 chronic kidney disease, or unspecified chronic kidney disease: Secondary | ICD-10-CM

## 2023-03-21 ENCOUNTER — Ambulatory Visit: Payer: 59 | Admitting: Family Medicine

## 2023-04-06 ENCOUNTER — Ambulatory Visit: Payer: 59

## 2023-04-09 DIAGNOSIS — Z4822 Encounter for aftercare following kidney transplant: Secondary | ICD-10-CM | POA: Diagnosis not present

## 2023-04-09 DIAGNOSIS — Z940 Kidney transplant status: Secondary | ICD-10-CM | POA: Diagnosis not present

## 2023-04-09 DIAGNOSIS — Z125 Encounter for screening for malignant neoplasm of prostate: Secondary | ICD-10-CM | POA: Diagnosis not present

## 2023-04-09 DIAGNOSIS — Z7722 Contact with and (suspected) exposure to environmental tobacco smoke (acute) (chronic): Secondary | ICD-10-CM | POA: Diagnosis not present

## 2023-04-09 DIAGNOSIS — I10 Essential (primary) hypertension: Secondary | ICD-10-CM | POA: Diagnosis not present

## 2023-04-09 DIAGNOSIS — C61 Malignant neoplasm of prostate: Secondary | ICD-10-CM | POA: Diagnosis not present

## 2023-04-09 DIAGNOSIS — Z79899 Other long term (current) drug therapy: Secondary | ICD-10-CM | POA: Diagnosis not present

## 2023-04-09 DIAGNOSIS — E875 Hyperkalemia: Secondary | ICD-10-CM | POA: Diagnosis not present

## 2023-04-09 DIAGNOSIS — Z23 Encounter for immunization: Secondary | ICD-10-CM | POA: Diagnosis not present

## 2023-04-09 DIAGNOSIS — I120 Hypertensive chronic kidney disease with stage 5 chronic kidney disease or end stage renal disease: Secondary | ICD-10-CM | POA: Diagnosis not present

## 2023-04-09 DIAGNOSIS — N186 End stage renal disease: Secondary | ICD-10-CM | POA: Diagnosis not present

## 2023-04-09 DIAGNOSIS — D84821 Immunodeficiency due to drugs: Secondary | ICD-10-CM | POA: Diagnosis not present

## 2023-05-26 ENCOUNTER — Other Ambulatory Visit: Payer: Self-pay | Admitting: Family Medicine

## 2023-05-26 ENCOUNTER — Telehealth: Payer: 59 | Admitting: Family Medicine

## 2023-05-26 DIAGNOSIS — J111 Influenza due to unidentified influenza virus with other respiratory manifestations: Secondary | ICD-10-CM | POA: Diagnosis not present

## 2023-05-26 DIAGNOSIS — I129 Hypertensive chronic kidney disease with stage 1 through stage 4 chronic kidney disease, or unspecified chronic kidney disease: Secondary | ICD-10-CM

## 2023-05-26 MED ORDER — OSELTAMIVIR PHOSPHATE 75 MG PO CAPS: 75.0000 mg | ORAL_CAPSULE | Freq: Two times a day (BID) | ORAL | 0 refills | Status: DC

## 2023-05-26 NOTE — Progress Notes (Signed)

## 2023-05-27 ENCOUNTER — Ambulatory Visit (INDEPENDENT_AMBULATORY_CARE_PROVIDER_SITE_OTHER): Payer: 59

## 2023-05-27 ENCOUNTER — Ambulatory Visit
Admission: RE | Admit: 2023-05-27 | Discharge: 2023-05-27 | Disposition: A | Discharge status: Home / Self Care | Payer: 59 | Source: Ambulatory Visit | Attending: Physician Assistant | Admitting: Physician Assistant

## 2023-05-27 VITALS — BP 120/74 | HR 66 | Temp 99.4°F | Resp 16

## 2023-05-27 DIAGNOSIS — J329 Chronic sinusitis, unspecified: Secondary | ICD-10-CM

## 2023-05-27 DIAGNOSIS — R051 Acute cough: Secondary | ICD-10-CM

## 2023-05-27 DIAGNOSIS — Z940 Kidney transplant status: Secondary | ICD-10-CM | POA: Diagnosis not present

## 2023-05-27 DIAGNOSIS — J40 Bronchitis, not specified as acute or chronic: Secondary | ICD-10-CM

## 2023-05-27 DIAGNOSIS — R059 Cough, unspecified: Secondary | ICD-10-CM | POA: Diagnosis not present

## 2023-05-27 LAB — POC COVID19/FLU A&B COMBO
Covid Antigen, POC: NEGATIVE
Influenza A Antigen, POC: NEGATIVE
Influenza B Antigen, POC: NEGATIVE

## 2023-05-27 MED ORDER — DOXYCYCLINE HYCLATE 100 MG PO CAPS: 100.0000 mg | ORAL_CAPSULE | Freq: Two times a day (BID) | ORAL | 0 refills | Status: DC

## 2023-05-27 NOTE — Discharge Instructions (Addendum)
You were negative for flu and COVID.  Stop the Tamiflu that you are previously prescribed.  I am concerned for sinus/bronchitis infection.  Start doxycycline 100 mg twice daily for 10 days.  Stay out of the sun while on this medication.  Continue over-the-counter medication including Mucinex, Flonase, Tylenol.  Make sure you rest and drink plenty of fluid.  I also recommend nasal saline and sinus rinses.  If your symptoms are not improving within 3 to 5 days you should be reevaluated.  If anything worsens and you have high fever, worsening cough, shortness of breath, nausea/vomiting interfering with oral intake you need to be seen immediately.  Fuiste negativo para gripe y COVID.  Suspenda el Tamiflu que le recetaron previamente.  Me preocupa la infeccin de los senos nasales/bronquitis.  Inicie doxiciclina 100 mg dos veces al da durante [address]  Mantngase alejado del sol mientras toma este medicamento.  Contine con los medicamentos de [address], incluidos Mucinex, Flonase y Tylenol.  Asegrese de descansar y beber mucho lquido.  Tambin recomiendo enjuagues nasales con solucin salina y sinusales.  Si sus sntomas no mejoran dentro de 3 a [address], debe ser [place].  Si algo empeora y tiene fiebre alta, tos que [place], dificultad para respirar, nuseas/vmitos que interfieren con la ingesta oral, debe ser atendido de inmediato.

## 2023-05-27 NOTE — ED Triage Notes (Signed)
Pt reports chills, headache, body ache, nasal congestion, general discomfort, fever, coughing up phlegm x 1 week, yesterday had virtual appointment and was given an antiviral.

## 2023-05-27 NOTE — ED Provider Notes (Signed)
[facility]    CSN: [id number] Arrival date & time: 05/27/23  1052      History   Chief Complaint Chief Complaint  Patient presents with   Chills    Running nose, cough, Spanish speaking only - Entered by patient    HPI Dylan [name] is a 63 y.o. male.   Patient presents today with a 6-day history of URI symptoms.  He is Spanish-speaking and video interpreter was utilized during visit.  He reports congestion, cough, subjective fever, body aches.  Denies any chest pain, shortness of breath, nausea, vomiting.  He had a virtual visit yesterday and they diagnosed him with influenza so he was started on Tamiflu.  He has taken several doses of this but not had any improvement of symptoms.  He denies any recent antibiotics in the past 90 days.  He does have a history of kidney transplant approximately 2 years ago that he takes prednisone daily for.  He is eating and drinking normally.  Denies any known sick contacts.  He has not had COVID recently.    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   Renal disorder    Uremia of renal origin 03/29/2016    Patient Active Problem List   Diagnosis Date Noted   Cancer of prostate with intermediate recurrence risk (stage T2b-c or Gleason 7 or PSA 10-20) (HCC) 12/19/2022   Mixed hyperlipidemia 12/19/2022   Class 1 obesity due to excess calories with serious comorbidity and body mass index (BMI) of 31.0 to 31.9 in adult 12/19/2022   Kidney replaced by transplant 12/19/2022   Rash and nonspecific skin eruption 12/19/2022   Gastroesophageal reflux disease  without esophagitis 12/19/2022   Abnormal laboratory test 12/19/2022   Seizure (HCC) 09/18/2019   Annual physical exam 04/17/2019   Screening for viral disease 04/17/2019   Primary osteoarthritis of right knee 01/14/2019   Primary osteoarthritis of left knee 01/14/2019   Effusion, right knee 01/14/2019   LVH (left ventricular hypertrophy) 12/27/2018   Acidosis 08/28/2018   Anemia in chronic kidney disease 08/28/2018   Arteriovenous fistula, acquired (HCC) 08/28/2018   Chronic pain of left ankle 07/04/2018   Bilateral chronic knee pain 07/04/2018   Glucosuria 07/04/2018   Hypertensive nephropathy 06/11/2018   Hyperkalemia, diminished renal excretion 03/29/2016   ESRD (end stage renal disease) (HCC) 03/29/2016   Uremia of renal origin 03/29/2016   HTN (hypertension) 03/29/2016   Metabolic acidosis, NAG, 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   History of anemia due to CKD 03/29/2016    Past Surgical History:  Procedure Laterality Date   BASCILIC VEIN TRANSPOSITION Left 04/06/2016   Procedure: Left Arm Single Stage Bascilic Vein Transposition;  Surgeon: Maeola Harman, MD;  Location: [facility] OR;  Service: Vascular;  Laterality: Left;   INSERTION OF DIALYSIS CATHETER Right 04/06/2016   Procedure: INSERTION OF Tunneled DIALYSIS CATHETER RIGHT INTERNAL JUGULAR;  Surgeon: Maeola Harman, MD;  Location: [facility] OR;  Service: Vascular;  Laterality: Right;   IR GENERIC HISTORICAL  06/26/2016   IR REMOVAL TUN CV CATH W/O FL 06/26/2016 Oley Balm, MD [facility]  NEPHRECTOMY TRANSPLANTED ORGAN     TOOTH EXTRACTION  2020       Home Medications    Prior to Admission medications   Medication Sig Start Date End Date Taking? Authorizing Provider  doxycycline (VIBRAMYCIN) 100 MG capsule Take 1 capsule (100 mg total) by mouth 2 (two) times daily. 05/27/23  Yes Braleigh Massoud K, PA-C  chlorthalidone (HYGROTON) 25 MG tablet Take 1 tablet by mouth daily. 05/02/22 05/02/23   [provider]  ENVARSUS XR 4 MG TB24 Take 4 mg by mouth daily.    [provider]  famotidine (PEPCID) 20 MG tablet Take 1 tablet (20 mg total) by mouth daily. 12/15/22   Ellender Hose, NP  fluticasone (FLONASE) 50 MCG/ACT nasal spray Place 1 spray into both nostrils 2 (two) times daily. 05/13/22   Particia Nearing, PA-C  labetalol (NORMODYNE) 200 MG tablet TOME 1 TABLETA POR VIA ORAL DOS VECES AL DIA 03/06/23   Ellender Hose, NP  LOKELMA 10 g PACK packet Take 10 g by mouth daily. 04/10/22   [provider]  mycophenolate (CELLCEPT) 250 MG capsule Take 500 mg by mouth 2 (two) times daily.    [provider]  predniSONE (DELTASONE) 5 MG tablet Take 5 mg by mouth daily.    [provider]  rosuvastatin (CRESTOR) 10 MG tablet Take 1 tablet (10 mg total) by mouth daily. 12/15/22   Ellender Hose, NP  tamsulosin (FLOMAX) 0.4 MG CAPS capsule Take 1 capsule (0.4 mg total) by mouth daily after supper. 12/15/22   Ellender Hose, NP  triamcinolone (KENALOG) 0.025 % ointment APLIQUE AL AREA AFECTADA DOS VECES AL DIA 02/07/23   Ellender Hose, NP  trimethoprim-polymyxin b (POLYTRIM) ophthalmic solution Place 2 drops into both eyes every 6 (six) hours. 06/05/22   Arnette Felts, FNP  VITAMIN D-1000 MAX ST 25 MCG (1000 UT) tablet Take 1,000 Units by mouth daily. 05/02/22   [provider]    Family History Family History  Problem Relation Age of Onset   Breast cancer Mother    Prostate cancer Father    Diabetes Brother    Breast cancer Sister    Colon cancer Neg Hx    Esophageal cancer Neg Hx    Inflammatory bowel disease Neg Hx    Liver disease Neg Hx    Pancreatic cancer Neg Hx    Rectal cancer Neg Hx    Stomach cancer Neg Hx     Social History Social History   Tobacco Use   Smoking status: Never   Smokeless tobacco: Never  Vaping Use   Vaping status: Never Used  Substance Use Topics   Alcohol use: No   Drug use: No     Allergies   Patient  has no known allergies.   Review of Systems Review of Systems  Constitutional:  Positive for activity change, chills, fatigue and fever (Subjective). Negative for appetite change.  HENT:  Positive for congestion. Negative for sinus pressure, sneezing and sore throat.   Respiratory:  Positive for cough. Negative for shortness of breath.   Cardiovascular:  Negative for chest pain.  Gastrointestinal:  Negative for abdominal pain, diarrhea, nausea and vomiting.  Musculoskeletal:  Positive for arthralgias and myalgias.  Neurological:  Negative for dizziness, light-headedness and headaches.     Physical Exam Triage Vital Signs ED Triage Vitals  Encounter Vitals Group     BP 05/27/23 1206 120/74     Systolic BP Percentile --      Diastolic BP  Percentile --      Pulse Rate 05/27/23 1206 66     Resp 05/27/23 1206 16     Temp 05/27/23 1206 99.4 F (37.4 C)     Temp Source 05/27/23 1206 Oral     SpO2 05/27/23 1206 94 %     Weight --      Height --      Head Circumference --      Peak Flow --      Pain Score 05/27/23 1212 3     Pain Loc --      Pain Education --      Exclude from Growth Chart --    No data found.  Updated Vital Signs BP 120/74 (BP Location: Right Arm)   Pulse 66   Temp 99.4 F (37.4 C) (Oral)   Resp 16   SpO2 94%   Visual Acuity Right Eye Distance:   Left Eye Distance:   Bilateral Distance:    Right Eye Near:   Left Eye Near:    Bilateral Near:     Physical Exam Vitals reviewed.  Constitutional:      General: He is awake.     Appearance: Normal appearance. He is well-developed and normal weight. He is not ill-appearing.     Comments: Very pleasant male appears stated age in no acute distress sitting comfortably in exam room  HENT:     Head: Normocephalic and atraumatic.     Right Ear: Tympanic membrane, ear canal and external ear normal. Tympanic membrane is not erythematous or bulging.     Left Ear: Tympanic membrane, ear canal and external ear  normal. Tympanic membrane is not erythematous or bulging.     Nose: Nose normal.     Mouth/Throat:     Pharynx: Uvula midline. Posterior oropharyngeal erythema and postnasal drip present. No oropharyngeal exudate or uvula swelling.  Cardiovascular:     Rate and Rhythm: Normal rate and regular rhythm.     Heart sounds: Normal heart sounds, S1 normal and S2 normal. No murmur heard. Pulmonary:     Effort: Pulmonary effort is normal. No accessory muscle usage or respiratory distress.     Breath sounds: Normal breath sounds. No stridor. No wheezing, rhonchi or rales.     Comments: Clear to auscultation bilaterally Neurological:     Mental Status: He is alert.  Psychiatric:        Behavior: Behavior is cooperative.      UC Treatments / Results  Labs (all labs ordered are listed, but only abnormal results are displayed) Labs Reviewed  POC COVID19/FLU A&B COMBO    EKG   Radiology No results found.  Procedures Procedures (including critical care time)  Medications Ordered in UC Medications - No data to display  Initial Impression / Assessment and Plan / UC Course  I have reviewed the triage vital signs and the nursing notes.  Pertinent labs & imaging results that were available during my care of the patient were reviewed by me and considered in my medical decision making (see chart for details).     Patient is well-appearing, afebrile, nontoxic, nontachycardic.  He has been symptomatic for approximately a week with worsening symptoms of concern for secondary bacterial infection.  Given he is immunosuppressed and a transplant recipient COVID and flu testing were obtained as he would benefit from antiviral therapy if positive.  This was negative in clinic and so he was instructed to discontinue the Tamiflu that was prescribed by virtual care.  Chest x-ray was obtained that showed no acute cardiopulmonary disease based on my primary read.  At the time of discharge we were waiting for  radiologist over read and we will contact him if this differs and changes our treatment plan.  Will cover for sinobronchitis with doxycycline 100 mg twice daily for 10 days.  We discussed that he is to stay out of the sun while on this medication due to associated photosensitivity.  Recommended that he use over-the-counter medications for additional symptom relief.  He is to follow-up closely with his primary care.  Discussed that if anything worsens or changes he needs to be seen immediately.  He was provided a work excuse note.  All questions answered to patient and wife's satisfaction.  Final Clinical Impressions(s) / UC Diagnoses   Final diagnoses:  Acute cough  Sinobronchitis  Renal transplant recipient     Discharge Instructions      You were negative for flu and COVID.  Stop the Tamiflu that you are previously prescribed.  I am concerned for sinus/bronchitis infection.  Start doxycycline 100 mg twice daily for 10 days.  Stay out of the sun while on this medication.  Continue over-the-counter medication including Mucinex, Flonase, Tylenol.  Make sure you rest and drink plenty of fluid.  I also recommend nasal saline and sinus rinses.  If your symptoms are not improving within 3 to 5 days you should be reevaluated.  If anything worsens and you have high fever, worsening cough, shortness of breath, nausea/vomiting interfering with oral intake you need to be seen immediately.  Fuiste negativo para gripe y COVID.  Suspenda el Tamiflu que le recetaron previamente.  Me preocupa la infeccin de los senos nasales/bronquitis.  Inicie doxiciclina 100 mg dos veces al da durante [address]  Mantngase alejado del sol mientras toma este medicamento.  Contine con los medicamentos de [address], incluidos Mucinex, Flonase y Tylenol.  Asegrese de descansar y beber mucho lquido.  Tambin recomiendo enjuagues nasales con solucin salina y sinusales.  Si sus sntomas no mejoran dentro de 3 a [address], debe ser  [place].  Si algo empeora y tiene fiebre alta, tos que [place], dificultad para respirar, nuseas/vmitos que interfieren con la ingesta oral, debe ser atendido de inmediato.     ED Prescriptions     Medication Sig Dispense Auth. Provider   doxycycline (VIBRAMYCIN) 100 MG capsule Take 1 capsule (100 mg total) by mouth 2 (two) times daily. 20 capsule Coleman Kalas, Noberto Retort, PA-C      PDMP not reviewed this encounter.   Jeani Hawking, PA-C 05/27/23 1258

## 2023-05-31 DIAGNOSIS — Z940 Kidney transplant status: Secondary | ICD-10-CM | POA: Diagnosis not present

## 2023-05-31 DIAGNOSIS — Z5181 Encounter for therapeutic drug level monitoring: Secondary | ICD-10-CM | POA: Diagnosis not present

## 2023-06-18 ENCOUNTER — Encounter: Payer: Self-pay | Admitting: Nurse Practitioner

## 2023-06-18 ENCOUNTER — Ambulatory Visit (INDEPENDENT_AMBULATORY_CARE_PROVIDER_SITE_OTHER): Payer: 59 | Admitting: Nurse Practitioner

## 2023-06-18 VITALS — BP 120/80 | HR 65 | Temp 98.3°F | Ht 66.0 in | Wt 199.8 lb

## 2023-06-18 DIAGNOSIS — N183 Chronic kidney disease, stage 3 unspecified: Secondary | ICD-10-CM

## 2023-06-18 DIAGNOSIS — E782 Mixed hyperlipidemia: Secondary | ICD-10-CM | POA: Diagnosis not present

## 2023-06-18 DIAGNOSIS — E6609 Other obesity due to excess calories: Secondary | ICD-10-CM

## 2023-06-18 DIAGNOSIS — Z6832 Body mass index (BMI) 32.0-32.9, adult: Secondary | ICD-10-CM

## 2023-06-18 DIAGNOSIS — E66811 Obesity, class 1: Secondary | ICD-10-CM

## 2023-06-18 DIAGNOSIS — L819 Disorder of pigmentation, unspecified: Secondary | ICD-10-CM

## 2023-06-18 DIAGNOSIS — Z2821 Immunization not carried out because of patient refusal: Secondary | ICD-10-CM | POA: Diagnosis not present

## 2023-06-18 DIAGNOSIS — I129 Hypertensive chronic kidney disease with stage 1 through stage 4 chronic kidney disease, or unspecified chronic kidney disease: Secondary | ICD-10-CM

## 2023-06-18 DIAGNOSIS — C61 Malignant neoplasm of prostate: Secondary | ICD-10-CM | POA: Diagnosis not present

## 2023-06-18 NOTE — Progress Notes (Signed)
 Madelaine Bhat, CMA,acting as a Neurosurgeon for Arnette Felts, FNP.,have documented all relevant documentation on the behalf of Arnette Felts, FNP,as directed by  Arnette Felts, FNP while in the presence of Arnette Felts, FNP.  Subjective:  Patient ID: Dylan [name] , male    DOB: 1961-01-29 , 63 y.o.   MRN: [id number]  Chief Complaint  Patient presents with   Hypertension    HPI  Patient presents today for a bp and chol follow up, Patient reports compliance with medication. Patient denies any chest pain, SOB, or headaches. Patient has no concerns today. He continues to be followed by the kidney transplant in December 2024 next appt is in March. He is being treated for prostate cancer in the last year. He had a procedure done.  He is here today with the spanish interpreter Jonetta Speak and his wife.    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   Renal disorder    Uremia of renal origin 03/29/2016     Family History  Problem Relation Age of Onset   Breast cancer Mother    Prostate cancer Father    Diabetes Brother    Breast cancer Sister    Colon cancer Neg Hx    Esophageal cancer Neg Hx    Inflammatory bowel disease Neg Hx    Liver disease Neg Hx    Pancreatic cancer Neg Hx    Rectal cancer Neg Hx    Stomach cancer Neg Hx      Current Outpatient Medications:    chlorthalidone (HYGROTON) 25 MG tablet, Take 1 tablet by mouth daily., Disp: , Rfl:    doxycycline (VIBRAMYCIN) 100 MG capsule, Take 1 capsule (100 mg total) by mouth 2 (two) times daily., Disp: 20 capsule, Rfl: 0   ENVARSUS XR 4 MG TB24, Take 4 mg by  mouth daily., Disp: , Rfl:    famotidine (PEPCID) 20 MG tablet, Take 1 tablet (20 mg total) by mouth daily., Disp: 90 tablet, Rfl: 2   fluticasone (FLONASE) 50 MCG/ACT nasal spray, Place 1 spray into both nostrils 2 (two) times daily., Disp: 16 g, Rfl: 2   labetalol (NORMODYNE) 200 MG tablet, TOME 1 TABLETA POR VIA ORAL DOS VECES AL DIA, Disp: 60 tablet, Rfl: 2   mycophenolate (CELLCEPT) 250 MG capsule, Take 500 mg by mouth 2 (two) times daily., Disp: , Rfl:    predniSONE (DELTASONE) 5 MG tablet, Take 5 mg by mouth daily., Disp: , Rfl:    rosuvastatin (CRESTOR) 10 MG tablet, Take 1 tablet (10 mg total) by mouth daily., Disp: 90 tablet, Rfl: 1   tamsulosin (FLOMAX) 0.4 MG CAPS capsule, Take 1 capsule (0.4 mg total) by mouth daily after supper., Disp: 90 capsule, Rfl: 1   VITAMIN D-1000 MAX ST 25 MCG (1000 UT) tablet, Take 1,000 Units by mouth daily., Disp: , Rfl:    No Known Allergies   Review of Systems  Constitutional:  Negative for chills, fatigue and fever.  HENT:  Positive for sore throat. Negative for ear pain.   Respiratory: Negative.  Negative for cough.   Cardiovascular: Negative.   Gastrointestinal:  Negative.   Neurological: Negative.   Psychiatric/Behavioral: Negative.       Today's Vitals   06/18/23 1438  BP: 120/80  Pulse: 65  Temp: 98.3 F (36.8 C)  TempSrc: Oral  Weight: 199 lb 12.8 oz (90.6 kg)  Height: 5\' 6"  (1.676 m)  PainSc: 0-No pain   Body mass index is 32.25 kg/m.  Wt Readings from Last 3 Encounters:  06/18/23 199 lb 12.8 oz (90.6 kg)  02/07/23 196 lb (88.9 kg)  12/15/22 193 lb 3.2 oz (87.6 kg)    The 10-year ASCVD risk score (Arnett DK, et al., 2019) is: 9%   Values used to calculate the score:     Age: 63 years     Sex: Male     Is Non-Hispanic African American: No     Diabetic: No     Tobacco smoker: No     Systolic Blood Pressure: 120 mmHg     Is BP treated: Yes     HDL Cholesterol: 53 mg/dL     Total Cholesterol: 155 mg/dL  Objective:   Physical Exam Vitals reviewed.  Constitutional:      General: He is not in acute distress.    Appearance: Normal appearance.  HENT:     Head: Normocephalic.     Right Ear: Tympanic membrane, ear canal and external ear normal. There is no impacted cerumen.     Left Ear: Tympanic membrane, ear canal and external ear normal. There is no impacted cerumen.     Nose: Nose normal.     Mouth/Throat:     Mouth: Mucous membranes are moist.  Eyes:     General: Lids are normal.     Comments: Erythema bilateral sclera  Cardiovascular:     Rate and Rhythm: Normal rate and regular rhythm.     Pulses: Normal pulses.     Heart sounds: Normal heart sounds. No murmur heard. Pulmonary:     Effort: Pulmonary effort is normal. No respiratory distress.     Breath sounds: Normal breath sounds. No wheezing.  Skin:    General: Skin is warm.     Findings: Rash (posterior lower leg) present.     Comments: No significant color change to face may be slightly darker  Neurological:     General: No focal deficit present.     Mental Status: He is alert and oriented to person, place, and time.         Assessment And Plan:  Hypertensive nephropathy -     BMP8+eGFR  Mixed hyperlipidemia Assessment & Plan: Cholesterol levels are stable. Continue focusing on low fat diet.   Orders: -     Lipid panel  Benign hypertension with CKD (chronic kidney disease) stage III (HCC) Assessment & Plan: Blood pressure is controlled, continue current medications   COVID-19 vaccination declined  Herpes zoster vaccination declined Assessment & Plan: Declines shingrix, educated on disease process and is aware if he changes his mind to notify office    Class 1 obesity due to excess calories without serious comorbidity with body mass index (BMI) of 32.0 to 32.9 in adult Assessment & Plan: he is encouraged to strive for BMI less than 30 to decrease cardiac risk. Advised to aim for at least 150 minutes of exercise per  week.    Cancer of prostate with intermediate recurrence risk (stage T2b-c or Gleason 7 or PSA 10-20) (HCC) Assessment & Plan: Continue f/u with Urology, this is a new diagnosis   Discoloration of skin -  Ambulatory referral to Dermatology    Return for 6 month bp check.  Patient was given opportunity to ask questions. Patient verbalized understanding of the plan and was able to repeat key elements of the plan. All questions were answered to their satisfaction.     Jeanell Sparrow, FNP, have reviewed all documentation for this visit. The documentation on 06/18/23 for the exam, diagnosis, procedures, and orders are all accurate and complete.  IF YOU HAVE BEEN REFERRED TO A SPECIALIST, IT MAY TAKE 1-2 WEEKS TO SCHEDULE/PROCESS THE REFERRAL. IF YOU HAVE NOT HEARD FROM US/SPECIALIST IN TWO WEEKS, PLEASE GIVE [place] A CALL AT [phone].

## 2023-06-19 LAB — LIPID PANEL
Chol/HDL Ratio: 2.9 {ratio} (ref 0.0–5.0)
Cholesterol, Total: 155 mg/dL (ref 100–199)
HDL: 53 mg/dL (ref >39)
LDL Chol Calc (NIH): 79 mg/dL (ref 0–99)
Triglycerides: 129 mg/dL (ref 0–149)
VLDL Cholesterol Cal: 23 mg/dL (ref 5–40)

## 2023-06-19 LAB — BMP8+EGFR
BUN/Creatinine Ratio: 21 (ref 10–24)
BUN: 50 mg/dL — ABNORMAL HIGH (ref 8–27)
CO2: 21 mmol/L (ref 20–29)
Calcium: 10.4 mg/dL — ABNORMAL HIGH (ref 8.6–10.2)
Chloride: 105 mmol/L (ref 96–106)
Creatinine, Ser: 2.4 mg/dL — ABNORMAL HIGH (ref 0.76–1.27)
Glucose: 118 mg/dL — ABNORMAL HIGH (ref 70–99)
Potassium: 5.8 mmol/L — ABNORMAL HIGH (ref 3.5–5.2)
Sodium: 140 mmol/L (ref 134–144)
eGFR: 30 mL/min/{1.73_m2} — ABNORMAL LOW (ref >59)

## 2023-06-25 ENCOUNTER — Encounter: Payer: Self-pay | Admitting: Nurse Practitioner

## 2023-06-25 DIAGNOSIS — L819 Disorder of pigmentation, unspecified: Secondary | ICD-10-CM | POA: Insufficient documentation

## 2023-06-25 DIAGNOSIS — L309 Dermatitis, unspecified: Secondary | ICD-10-CM | POA: Diagnosis not present

## 2023-06-25 DIAGNOSIS — B001 Herpesviral vesicular dermatitis: Secondary | ICD-10-CM | POA: Diagnosis not present

## 2023-06-25 DIAGNOSIS — Z2821 Immunization not carried out because of patient refusal: Secondary | ICD-10-CM | POA: Insufficient documentation

## 2023-06-25 DIAGNOSIS — E6609 Other obesity due to excess calories: Secondary | ICD-10-CM | POA: Insufficient documentation

## 2023-06-25 DIAGNOSIS — N183 Chronic kidney disease, stage 3 unspecified: Secondary | ICD-10-CM | POA: Insufficient documentation

## 2023-06-25 NOTE — Assessment & Plan Note (Signed)
 Continue f/u with Urology, this is a new diagnosis

## 2023-06-25 NOTE — Assessment & Plan Note (Signed)
 Declines shingrix, educated on disease process and is aware if he changes his mind to notify office

## 2023-06-25 NOTE — Assessment & Plan Note (Deleted)
 Blood pressure is controlled, continue current medications

## 2023-06-25 NOTE — Assessment & Plan Note (Signed)
 he is encouraged to strive for BMI less than 30 to decrease cardiac risk. Advised to aim for at least 150 minutes of exercise per week.

## 2023-06-25 NOTE — Assessment & Plan Note (Signed)
 He has has a history of discoloration to his skin to the facial area. I do not notice a significant color change but he would like to see a dermatologist

## 2023-06-25 NOTE — Assessment & Plan Note (Signed)
 Cholesterol levels are stable.  Continue focusing on low-fat diet.

## 2023-06-25 NOTE — Assessment & Plan Note (Signed)
 Blood pressure is controlled, continue current medications

## 2023-07-10 DIAGNOSIS — Z940 Kidney transplant status: Secondary | ICD-10-CM | POA: Diagnosis not present

## 2023-07-10 DIAGNOSIS — Z5181 Encounter for therapeutic drug level monitoring: Secondary | ICD-10-CM | POA: Diagnosis not present

## 2023-07-11 ENCOUNTER — Encounter: Payer: Self-pay | Admitting: Orthopaedic Surgery

## 2023-07-11 ENCOUNTER — Other Ambulatory Visit (INDEPENDENT_AMBULATORY_CARE_PROVIDER_SITE_OTHER): Payer: Self-pay

## 2023-07-11 ENCOUNTER — Ambulatory Visit: Admitting: Orthopaedic Surgery

## 2023-07-11 DIAGNOSIS — G8929 Other chronic pain: Secondary | ICD-10-CM

## 2023-07-11 DIAGNOSIS — M25562 Pain in left knee: Secondary | ICD-10-CM

## 2023-07-11 MED ORDER — METHYLPREDNISOLONE ACETATE 40 MG/ML IJ SUSP
40.0000 mg | INTRAMUSCULAR | Status: AC | PRN
  Administered 2023-07-11: 40 mg via INTRA_ARTICULAR

## 2023-07-11 MED ORDER — BUPIVACAINE HCL 0.5 % IJ SOLN
2.0000 mL | INTRAMUSCULAR | Status: AC | PRN
  Administered 2023-07-11: 2 mL via INTRA_ARTICULAR

## 2023-07-11 MED ORDER — LIDOCAINE HCL 1 % IJ SOLN
2.0000 mL | INTRAMUSCULAR | Status: AC | PRN
  Administered 2023-07-11: 2 mL

## 2023-07-11 NOTE — Progress Notes (Signed)
 Office Visit Note   Patient: Dylan [name]           Date of Birth: 21-Sep-1960           MRN: [id number] Visit Date: 07/11/2023              Requested by: Arnette Felts, FNP [address] [place],  [place] [address] PCP: Arnette Felts, FNP   Assessment & Plan: Visit Diagnoses:  1. 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  Bone on bone joint space narrowing is seen on radiographs.  Treatment options given and he would like to try another cortisone injection today.  He'll pick up some voltaren gel.  TKA handout provided.  Language barrier increased complexity of the visit.  Follow-Up Instructions: No follow-ups on file.   Orders:  Orders Placed This Encounter  Procedures   Large Joint Inj: L knee   XR KNEE 3 VIEW LEFT   No orders of the defined types were placed in this encounter.     Procedures: Large Joint Inj: L knee on 07/11/2023 9:57 AM Details: 22 G needle Medications: 2 mL bupivacaine 0.5 %; 2 mL lidocaine 1 %; 40 mg methylPREDNISolone acetate 40 MG/ML Outcome: tolerated well, no immediate complications Patient was prepped and draped in the usual sterile fashion.       Clinical Data: No additional findings.   Subjective: Chief Complaint  Patient presents with   Left Knee - Pain    HPI Dylan [name] is a 63 year old male with chronic left knee pain for a few years.  He has had cortisone injections in 2023 which helped temporarily.  He has chronic pain that's worse with standing and activity.  He denies any recent injuries to the knee.    Review of Systems  Constitutional: Negative.   HENT: Negative.    Eyes: Negative.   Respiratory: Negative.    Cardiovascular: Negative.   Gastrointestinal: Negative.   Endocrine: Negative.   Genitourinary: Negative.   Skin: Negative.   Allergic/Immunologic: Negative.   Neurological: Negative.   Hematological: Negative.    Psychiatric/Behavioral: Negative.    All other systems reviewed and are negative.    Objective: Vital Signs: There were no vitals taken for this visit.  Physical Exam Vitals and nursing note reviewed.  Constitutional:      Appearance: He is well-developed.  HENT:     Head: Normocephalic and atraumatic.  Eyes:     Pupils: Pupils are equal, round, and reactive to light.  Pulmonary:     Effort: Pulmonary effort is normal.  Abdominal:     Palpations: Abdomen is soft.  Musculoskeletal:        General: Normal range of motion.     Cervical back: Neck supple.  Skin:    General: Skin is warm.  Neurological:     Mental Status: He is alert and oriented to person, place, and time.  Psychiatric:        Behavior: Behavior normal.        Thought Content: Thought content normal.        Judgment: Judgment normal.     Ortho Exam Exam of left knee shows varus deformity.  Medial joint line tenderness.  Pain with range of motion.  Range of motion from 0-125 degrees.  Specialty Comments:  No specialty comments available.  Imaging: XR KNEE 3 VIEW LEFT Result Date:  07/11/2023 X-rays of the left knee show advanced tricompartmental degenerative joint disease.  Bone-on-bone joint space narrowing of the medial compartment and varus deformity.    PMFS History: Patient Active Problem List   Diagnosis Date Noted   Benign hypertension with CKD (chronic kidney disease) stage III (HCC) 06/25/2023   Herpes zoster vaccination declined 06/25/2023   Class 1 obesity due to excess calories without serious comorbidity with body mass index (BMI) of 32.0 to 32.9 in adult 06/25/2023   Discoloration of skin 06/25/2023   Cancer of prostate with intermediate recurrence risk (stage T2b-c or Gleason 7 or PSA 10-20) (HCC) 12/19/2022   Mixed hyperlipidemia 12/19/2022   Class 1 obesity due to excess calories with serious comorbidity and body mass index (BMI) of 31.0 to 31.9 in adult 12/19/2022   Kidney  replaced by transplant 12/19/2022   Rash and nonspecific skin eruption 12/19/2022   Gastroesophageal reflux disease without esophagitis 12/19/2022   Abnormal laboratory test 12/19/2022   Seizure (HCC) 09/18/2019   Annual physical exam 04/17/2019   Screening for viral disease 04/17/2019   Primary osteoarthritis of right knee 01/14/2019   Primary osteoarthritis of left knee 01/14/2019   Effusion, right knee 01/14/2019   LVH (left ventricular hypertrophy) 12/27/2018   Acidosis 08/28/2018   Anemia in chronic kidney disease 08/28/2018   Arteriovenous fistula, acquired (HCC) 08/28/2018   Chronic pain of left ankle 07/04/2018   Bilateral chronic knee pain 07/04/2018   Glucosuria 07/04/2018   Hypertensive nephropathy 06/11/2018   Hyperkalemia, diminished renal excretion 03/29/2016   Uremia of renal origin 03/29/2016   HTN (hypertension) 03/29/2016   Metabolic acidosis, NAG, 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   History of anemia due to CKD 03/29/2016   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   Renal disorder    Uremia of renal origin 03/29/2016    Family History  Problem Relation Age of Onset   Breast cancer Mother    Prostate cancer Father    Diabetes Brother    Breast cancer Sister    Colon cancer Neg Hx    Esophageal cancer Neg Hx    Inflammatory bowel disease Neg Hx    Liver disease Neg Hx    Pancreatic cancer Neg Hx    Rectal cancer Neg Hx    Stomach cancer Neg Hx     Past Surgical History:  Procedure Laterality Date   BASCILIC VEIN TRANSPOSITION Left 04/06/2016   Procedure: Left  Arm Single Stage Bascilic Vein Transposition;  Surgeon: Maeola Harman, MD;  Location: [facility] OR;  Service: Vascular;  Laterality: Left;   INSERTION OF DIALYSIS CATHETER Right 04/06/2016   Procedure: INSERTION OF Tunneled DIALYSIS CATHETER RIGHT INTERNAL JUGULAR;  Surgeon: Maeola Harman, MD;  Location[facility] OR;  Service: Vascular;  Laterality: Right;   IR GENERIC HISTORICAL  06/26/2016   IR REMOVAL TUN CV CATH W/O FL 06/26/2016 Oley Balm, MD [facility]-INTERV RAD   NEPHRECTOMY TRANSPLANTED ORGAN     TOOTH EXTRACTION  2020   Social History   Occupational History   Occupation: [employer]  Tobacco Use   Smoking status: Never  Smokeless tobacco: Never  Vaping Use   Vaping status: Never Used  Substance and Sexual Activity   Alcohol use: No   Drug use: No   Sexual activity: Yes    Birth control/protection: None

## 2023-07-17 ENCOUNTER — Telehealth: Admitting: Physician Assistant

## 2023-07-17 DIAGNOSIS — L0390 Cellulitis, unspecified: Secondary | ICD-10-CM | POA: Diagnosis not present

## 2023-07-17 MED ORDER — CEPHALEXIN 500 MG PO CAPS: 500.0000 mg | ORAL_CAPSULE | Freq: Four times a day (QID) | ORAL | 0 refills | Status: DC

## 2023-07-17 NOTE — Progress Notes (Signed)

## 2023-08-16 ENCOUNTER — Other Ambulatory Visit: Payer: Self-pay | Admitting: Family Medicine

## 2023-08-16 DIAGNOSIS — K219 Gastro-esophageal reflux disease without esophagitis: Secondary | ICD-10-CM

## 2023-08-16 DIAGNOSIS — I129 Hypertensive chronic kidney disease with stage 1 through stage 4 chronic kidney disease, or unspecified chronic kidney disease: Secondary | ICD-10-CM

## 2023-08-28 DIAGNOSIS — E0922 Drug or chemical induced diabetes mellitus with diabetic chronic kidney disease: Secondary | ICD-10-CM | POA: Diagnosis not present

## 2023-08-28 DIAGNOSIS — Z79899 Other long term (current) drug therapy: Secondary | ICD-10-CM | POA: Diagnosis not present

## 2023-08-28 DIAGNOSIS — R972 Elevated prostate specific antigen [PSA]: Secondary | ICD-10-CM | POA: Diagnosis not present

## 2023-08-28 DIAGNOSIS — Z940 Kidney transplant status: Secondary | ICD-10-CM | POA: Diagnosis not present

## 2023-08-28 DIAGNOSIS — C61 Malignant neoplasm of prostate: Secondary | ICD-10-CM | POA: Diagnosis not present

## 2023-08-28 DIAGNOSIS — I129 Hypertensive chronic kidney disease with stage 1 through stage 4 chronic kidney disease, or unspecified chronic kidney disease: Secondary | ICD-10-CM | POA: Diagnosis not present

## 2023-08-28 DIAGNOSIS — Z79621 Long term (current) use of calcineurin inhibitor: Secondary | ICD-10-CM | POA: Diagnosis not present

## 2023-08-28 DIAGNOSIS — Z79624 Long term (current) use of inhibitors of nucleotide synthesis: Secondary | ICD-10-CM | POA: Diagnosis not present

## 2023-08-28 DIAGNOSIS — N189 Chronic kidney disease, unspecified: Secondary | ICD-10-CM | POA: Diagnosis not present

## 2023-08-28 DIAGNOSIS — Z7722 Contact with and (suspected) exposure to environmental tobacco smoke (acute) (chronic): Secondary | ICD-10-CM | POA: Diagnosis not present

## 2023-09-17 DIAGNOSIS — C61 Malignant neoplasm of prostate: Secondary | ICD-10-CM | POA: Diagnosis not present

## 2023-10-19 DIAGNOSIS — C61 Malignant neoplasm of prostate: Secondary | ICD-10-CM | POA: Diagnosis not present

## 2023-10-19 DIAGNOSIS — Z940 Kidney transplant status: Secondary | ICD-10-CM | POA: Diagnosis not present

## 2023-10-19 DIAGNOSIS — Z5181 Encounter for therapeutic drug level monitoring: Secondary | ICD-10-CM | POA: Diagnosis not present

## 2023-10-28 ENCOUNTER — Other Ambulatory Visit: Payer: Self-pay | Admitting: Family Medicine

## 2023-10-28 DIAGNOSIS — I129 Hypertensive chronic kidney disease with stage 1 through stage 4 chronic kidney disease, or unspecified chronic kidney disease: Secondary | ICD-10-CM

## 2023-10-31 DIAGNOSIS — C61 Malignant neoplasm of prostate: Secondary | ICD-10-CM | POA: Diagnosis not present

## 2023-10-31 DIAGNOSIS — I10 Essential (primary) hypertension: Secondary | ICD-10-CM | POA: Diagnosis not present

## 2023-10-31 DIAGNOSIS — Z538 Procedure and treatment not carried out for other reasons: Secondary | ICD-10-CM | POA: Diagnosis not present

## 2023-10-31 DIAGNOSIS — Z79899 Other long term (current) drug therapy: Secondary | ICD-10-CM | POA: Diagnosis not present

## 2023-10-31 DIAGNOSIS — N400 Enlarged prostate without lower urinary tract symptoms: Secondary | ICD-10-CM | POA: Diagnosis not present

## 2023-10-31 DIAGNOSIS — Z79621 Long term (current) use of calcineurin inhibitor: Secondary | ICD-10-CM | POA: Diagnosis not present

## 2023-10-31 DIAGNOSIS — Z7952 Long term (current) use of systemic steroids: Secondary | ICD-10-CM | POA: Diagnosis not present

## 2023-10-31 DIAGNOSIS — T380X5A Adverse effect of glucocorticoids and synthetic analogues, initial encounter: Secondary | ICD-10-CM | POA: Diagnosis not present

## 2023-10-31 DIAGNOSIS — Z940 Kidney transplant status: Secondary | ICD-10-CM | POA: Diagnosis not present

## 2023-10-31 DIAGNOSIS — E099 Drug or chemical induced diabetes mellitus without complications: Secondary | ICD-10-CM | POA: Diagnosis not present

## 2023-11-21 DIAGNOSIS — C61 Malignant neoplasm of prostate: Secondary | ICD-10-CM | POA: Diagnosis not present

## 2023-12-05 ENCOUNTER — Ambulatory Visit

## 2023-12-05 ENCOUNTER — Ambulatory Visit
Admission: EM | Admit: 2023-12-05 | Discharge: 2023-12-05 | Disposition: A | Discharge status: Home / Self Care | Attending: Nurse Practitioner | Admitting: Nurse Practitioner

## 2023-12-05 DIAGNOSIS — R399 Unspecified symptoms and signs involving the genitourinary system: Secondary | ICD-10-CM | POA: Diagnosis not present

## 2023-12-05 DIAGNOSIS — Z8546 Personal history of malignant neoplasm of prostate: Secondary | ICD-10-CM | POA: Insufficient documentation

## 2023-12-05 DIAGNOSIS — R300 Dysuria: Secondary | ICD-10-CM | POA: Insufficient documentation

## 2023-12-05 LAB — POCT URINE DIPSTICK
Bilirubin, UA: NEGATIVE
Glucose, UA: NEGATIVE mg/dL
Ketones, POC UA: NEGATIVE mg/dL
Nitrite, UA: NEGATIVE
Protein Ur, POC: >=300 mg/dL — AB
Spec Grav, UA: 1.015 (ref 1.010–1.025)
Urobilinogen, UA: 0.2 U/dL
pH, UA: 6 (ref 5.0–8.0)

## 2023-12-05 MED ORDER — CEPHALEXIN 500 MG PO CAPS: 500.0000 mg | ORAL_CAPSULE | Freq: Four times a day (QID) | ORAL | 0 refills | Status: AC

## 2023-12-05 NOTE — Discharge Instructions (Addendum)
 A urine culture is pending. You will be contacted when the results of the culture are received. If the results of the culture are negative, you will be advised to stop the antibiotic.  Take medication as prescribed. Increase your fluid intake. Make sure you are drinking at least 8 to 10 eight ounce glasses of water daily while symptoms persist. Develop a toileting schedule that will allow you to urinate at least every 2 hours. Avoid caffeine such as tea, soda or coffee while symptoms persist. If the results of the culture are negative or if you are continuing to experience symptoms after completing the antibiotic, please follow-up with your oncologist for further evaluation. Follow-up as needed.

## 2023-12-05 NOTE — ED Provider Notes (Signed)
 [facility]    CSN: [id number] Arrival date & time: 12/05/23  1534      History   Chief Complaint No chief complaint on file.   HPI Dylan [name] is a 63 y.o. male.   The history is provided by the patient. Language interpreter used: Wilfred; #[id number].   Patient presents for complaints of pain with urination for the past 2 days.  Patient reports that he had an ablation procedure for prostate cancer and had a Foley catheter placed.  States that the catheter was placed and left in until 2 days ago.  He states since the catheter has been removed, he has had pain with urination.  He also endorses urinary urgency and hematuria.  States that the hematuria was to be expected per his oncologist after the procedure.  Patient denies fever, chills, chest pain, abdominal pain, flank pain, decreased urine stream, or urinary incontinence.  Patient also with underlying history of kidney failure.  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   Renal disorder    Uremia of renal origin 03/29/2016    Patient Active Problem List   Diagnosis Date Noted   Benign hypertension with CKD (chronic kidney disease) stage III (HCC) 06/25/2023   Herpes zoster vaccination declined 06/25/2023   Class 1 obesity due to excess calories without serious comorbidity with body mass index (BMI) of 32.0 to 32.9 in adult 06/25/2023   Discoloration of skin 06/25/2023   Cancer of prostate with intermediate recurrence risk (stage T2b-c or Gleason 7 or PSA 10-20) (HCC) 12/19/2022   Mixed hyperlipidemia 12/19/2022   Class 1 obesity due to  excess calories with serious comorbidity and body mass index (BMI) of 31.0 to 31.9 in adult 12/19/2022   Kidney replaced by transplant 12/19/2022   Rash and nonspecific skin eruption 12/19/2022   Gastroesophageal reflux disease without esophagitis 12/19/2022   Abnormal laboratory test 12/19/2022   Seizure (HCC) 09/18/2019   Annual physical exam 04/17/2019   Screening for viral disease 04/17/2019   Primary osteoarthritis of right knee 01/14/2019   Primary osteoarthritis of left knee 01/14/2019   Effusion, right knee 01/14/2019   LVH (left ventricular hypertrophy) 12/27/2018   Acidosis 08/28/2018   Anemia in chronic kidney disease 08/28/2018   Arteriovenous fistula, acquired (HCC) 08/28/2018   Chronic pain of left ankle 07/04/2018   Bilateral chronic knee pain 07/04/2018   Glucosuria 07/04/2018   Hypertensive nephropathy 06/11/2018   Hyperkalemia, diminished renal excretion 03/29/2016   Uremia of renal origin 03/29/2016   HTN (hypertension) 03/29/2016   Metabolic acidosis, NAG, 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   History of anemia due to CKD 03/29/2016    Past Surgical History:  Procedure Laterality Date   BASCILIC VEIN TRANSPOSITION Left 04/06/2016   Procedure: Left Arm Single Stage Bascilic Vein Transposition;  Surgeon: Penne Lonni Colorado, MD;  Location: [facility] OR;  Service: Vascular;  Laterality: Left;   INSERTION OF DIALYSIS CATHETER Right 04/06/2016   Procedure: INSERTION OF Tunneled DIALYSIS CATHETER RIGHT INTERNAL JUGULAR;  Surgeon: Penne Lonni Colorado, MD;  Location: [facility] OR;  Service: Vascular;  Laterality: Right;   IR  GENERIC HISTORICAL  06/26/2016   IR REMOVAL TUN CV CATH W/O FL 06/26/2016 Toribio Faes, MD [facility]   NEPHRECTOMY TRANSPLANTED ORGAN     TOOTH EXTRACTION  2020       Home Medications    Prior to Admission medications   Medication Sig Start Date End Date Taking? Authorizing Provider  cephALEXin  (KEFLEX ) 500 MG capsule Take 1  capsule (500 mg total) by mouth 4 (four) times daily. 07/17/23   Vivienne Delon HERO, PA-C  chlorthalidone (HYGROTON) 25 MG tablet Take 1 tablet by mouth daily. 05/02/22 06/18/23  [provider]  doxycycline  (VIBRAMYCIN ) 100 MG capsule Take 1 capsule (100 mg total) by mouth 2 (two) times daily. 05/27/23   Raspet, Erin K, PA-C  ENVARSUS  XR 4 MG TB24 Take 4 mg by mouth daily.    [provider]  famotidine  (PEPCID ) 20 MG tablet TOME 1 TABLETA POR VIA ORAL TODOS LOS DIAS 08/16/23   Petrina Pries, NP  fluticasone  (FLONASE ) 50 MCG/ACT nasal spray Place 1 spray into both nostrils 2 (two) times daily. 05/13/22   Stuart Vernell Norris, PA-C  labetalol  (NORMODYNE ) 200 MG tablet TOME 1 TABLETA POR VIA ORAL DOS VECES AL DIA 10/29/23   Petrina Pries, NP  mycophenolate (CELLCEPT) 250 MG capsule Take 500 mg by mouth 2 (two) times daily.    [provider]  predniSONE  (DELTASONE ) 5 MG tablet Take 5 mg by mouth daily.    [provider]  rosuvastatin  (CRESTOR ) 10 MG tablet Take 1 tablet (10 mg total) by mouth daily. 12/15/22   Petrina Pries, NP  tamsulosin  (FLOMAX ) 0.4 MG CAPS capsule Take 1 capsule (0.4 mg total) by mouth daily after supper. 12/15/22   Petrina Pries, NP  VITAMIN D-1000 MAX ST 25 MCG (1000 UT) tablet Take 1,000 Units by mouth daily. 05/02/22   [provider]    Family History Family History  Problem Relation Age of Onset   Breast cancer Mother    Prostate cancer Father    Diabetes Brother    Breast cancer Sister    Colon cancer Neg Hx    Esophageal cancer Neg Hx    Inflammatory bowel disease Neg Hx    Liver disease Neg Hx    Pancreatic cancer Neg Hx    Rectal cancer Neg Hx    Stomach cancer Neg Hx     Social History Social History   Tobacco Use   Smoking status: Never   Smokeless tobacco: Never  Vaping Use   Vaping status: Never Used  Substance Use Topics   Alcohol use: No   Drug use: No     Allergies    Sulfamethoxazole-trimethoprim    Review of Systems Review of Systems Per HPI  Physical Exam Triage Vital Signs ED Triage Vitals  Encounter Vitals Group     BP 12/05/23 1614 (!) 153/79     Girls Systolic BP Percentile --      Girls Diastolic BP Percentile --      Boys Systolic BP Percentile --      Boys Diastolic BP Percentile --      Pulse Rate 12/05/23 1614 76     Resp 12/05/23 1614 16     Temp 12/05/23 1614 98.5 F (36.9 C)     Temp Source 12/05/23 1614 Oral     SpO2 12/05/23 1614 95 %     Weight --      Height --      Head Circumference --  Peak Flow --      Pain Score 12/05/23 1617 0     Pain Loc --      Pain Education --      Exclude from Growth Chart --    No data found.  Updated Vital Signs BP (!) 153/79 (BP Location: Right Arm)   Pulse 76   Temp 98.5 F (36.9 C) (Oral)   Resp 16   SpO2 95%   Visual Acuity Right Eye Distance:   Left Eye Distance:   Bilateral Distance:    Right Eye Near:   Left Eye Near:    Bilateral Near:     Physical Exam Vitals and nursing note reviewed.  Constitutional:      General: He is not in acute distress.    Appearance: Normal appearance.  HENT:     Head: Normocephalic.  Eyes:     Extraocular Movements: Extraocular movements intact.     Pupils: Pupils are equal, round, and reactive to light.  Cardiovascular:     Rate and Rhythm: Normal rate and regular rhythm.     Pulses: Normal pulses.     Heart sounds: Normal heart sounds.  Pulmonary:     Effort: Pulmonary effort is normal.     Breath sounds: Normal breath sounds.  Abdominal:     General: Bowel sounds are normal.     Palpations: Abdomen is soft.     Tenderness: There is no abdominal tenderness. There is no right CVA tenderness or left CVA tenderness.  Musculoskeletal:     Cervical back: Normal range of motion.  Skin:    General: Skin is warm and dry.  Neurological:     General: No focal deficit present.     Mental Status: He is alert and oriented to  person, place, and time.  Psychiatric:        Mood and Affect: Mood normal.        Behavior: Behavior normal.      UC Treatments / Results  Labs (all labs ordered are listed, but only abnormal results are displayed) Labs Reviewed  POCT URINE DIPSTICK - Abnormal; Notable for the following components:      Result Value   Color, UA brown (*)    Clarity, UA cloudy (*)    Blood, UA large (*)    Protein Ur, POC >=300 (*)    Leukocytes, UA Moderate (2+) (*)    All other components within normal limits    EKG   Radiology No results found.  Procedures Procedures (including critical care time)  Medications Ordered in UC Medications - No data to display  Initial Impression / Assessment and Plan / UC Course  I have reviewed the triage vital signs and the nursing notes.  Pertinent labs & imaging results that were available during my care of the patient were reviewed by me and considered in my medical decision making (see chart for details).  Urinalysis positive for leukocytes and protein along with blood.  Patient with history of prostate cancer.  Will treat empirically for UTI with Keflex  500 mg.  Supportive care recommendations were provided discussed with patient to include fluids, rest, over-the-counter analgesics, and developing a toileting schedule.  Urine culture is pending, patient was advised if the urine culture is negative and he is continue to experience symptoms, recommend follow-up with his oncologist for further evaluation.  Patient was in agreement with this plan of care and verbalizes understanding.  All questions were answered.  Patient stable for discharge.  Final Clinical Impressions(s) / UC Diagnoses   Final diagnoses:  None   Discharge Instructions   None    ED Prescriptions   None    PDMP not reviewed this encounter.   Gilmer Etta PARAS, NP 12/05/23 [id number]

## 2023-12-05 NOTE — ED Triage Notes (Incomplete)
 Pt reports when he urinates it burns after having a prostate procedure done x 2 weeks  States he has some blood with urination and urgency

## 2023-12-07 ENCOUNTER — Emergency Department (HOSPITAL_COMMUNITY)
Admission: EM | Admit: 2023-12-07 | Discharge: 2023-12-07 | Disposition: A | Discharge status: Home / Self Care | Attending: Emergency Medicine | Admitting: Emergency Medicine

## 2023-12-07 ENCOUNTER — Other Ambulatory Visit: Payer: Self-pay

## 2023-12-07 ENCOUNTER — Encounter (HOSPITAL_COMMUNITY): Payer: Self-pay | Admitting: Emergency Medicine

## 2023-12-07 DIAGNOSIS — I120 Hypertensive chronic kidney disease with stage 5 chronic kidney disease or end stage renal disease: Secondary | ICD-10-CM | POA: Insufficient documentation

## 2023-12-07 DIAGNOSIS — N3090 Cystitis, unspecified without hematuria: Secondary | ICD-10-CM | POA: Insufficient documentation

## 2023-12-07 DIAGNOSIS — D631 Anemia in chronic kidney disease: Secondary | ICD-10-CM | POA: Diagnosis not present

## 2023-12-07 DIAGNOSIS — Z8546 Personal history of malignant neoplasm of prostate: Secondary | ICD-10-CM | POA: Insufficient documentation

## 2023-12-07 DIAGNOSIS — N186 End stage renal disease: Secondary | ICD-10-CM | POA: Insufficient documentation

## 2023-12-07 DIAGNOSIS — D72829 Elevated white blood cell count, unspecified: Secondary | ICD-10-CM | POA: Diagnosis not present

## 2023-12-07 DIAGNOSIS — Z79899 Other long term (current) drug therapy: Secondary | ICD-10-CM | POA: Insufficient documentation

## 2023-12-07 DIAGNOSIS — R300 Dysuria: Secondary | ICD-10-CM | POA: Diagnosis not present

## 2023-12-07 LAB — COMPREHENSIVE METABOLIC PANEL WITH GFR
ALT: 17 U/L (ref 0–44)
AST: 14 U/L — ABNORMAL LOW (ref 15–41)
Albumin: 3.9 g/dL (ref 3.5–5.0)
Alkaline Phosphatase: 66 U/L (ref 38–126)
Anion gap: 11 (ref 5–15)
BUN: 43 mg/dL — ABNORMAL HIGH (ref 8–23)
CO2: 21 mmol/L — ABNORMAL LOW (ref 22–32)
Calcium: 9.9 mg/dL (ref 8.9–10.3)
Chloride: 102 mmol/L (ref 98–111)
Creatinine, Ser: 2.13 mg/dL — ABNORMAL HIGH (ref 0.61–1.24)
GFR, Estimated: 34 mL/min — ABNORMAL LOW (ref >60)
Glucose, Bld: 140 mg/dL — ABNORMAL HIGH (ref 70–99)
Potassium: 4.7 mmol/L (ref 3.5–5.1)
Sodium: 134 mmol/L — ABNORMAL LOW (ref 135–145)
Total Bilirubin: 1 mg/dL (ref 0.0–1.2)
Total Protein: 6.9 g/dL (ref 6.5–8.1)

## 2023-12-07 LAB — CBC WITH DIFFERENTIAL/PLATELET
Abs Immature Granulocytes: 0.07 K/uL (ref 0.00–0.07)
Basophils Absolute: 0.1 K/uL (ref 0.0–0.1)
Basophils Relative: 1 %
Eosinophils Absolute: 0.1 K/uL (ref 0.0–0.5)
Eosinophils Relative: 1 %
HCT: 36.2 % — ABNORMAL LOW (ref 39.0–52.0)
Hemoglobin: 11.4 g/dL — ABNORMAL LOW (ref 13.0–17.0)
Immature Granulocytes: 1 %
Lymphocytes Relative: 5 %
Lymphs Abs: 0.5 K/uL — ABNORMAL LOW (ref 0.7–4.0)
MCH: 28.7 pg (ref 26.0–34.0)
MCHC: 31.5 g/dL (ref 30.0–36.0)
MCV: 91.2 fL (ref 80.0–100.0)
Monocytes Absolute: 1.3 K/uL — ABNORMAL HIGH (ref 0.1–1.0)
Monocytes Relative: 12 %
Neutro Abs: 9 K/uL — ABNORMAL HIGH (ref 1.7–7.7)
Neutrophils Relative %: 80 %
Platelets: 249 K/uL (ref 150–400)
RBC: 3.97 MIL/uL — ABNORMAL LOW (ref 4.22–5.81)
RDW: 14 % (ref 11.5–15.5)
WBC: 11 K/uL — ABNORMAL HIGH (ref 4.0–10.5)
nRBC: 0 % (ref 0.0–0.2)

## 2023-12-07 LAB — URINALYSIS, W/ REFLEX TO CULTURE (INFECTION SUSPECTED)
Bacteria, UA: NONE SEEN
Bilirubin Urine: NEGATIVE
Glucose, UA: NEGATIVE mg/dL
Ketones, ur: NEGATIVE mg/dL
Nitrite: POSITIVE — AB
Protein, ur: 100 mg/dL — AB
RBC / HPF: >50 RBC/hpf (ref 0–5)
Specific Gravity, Urine: 1.014 (ref 1.005–1.030)
WBC, UA: >50 WBC/hpf (ref 0–5)
pH: 5 (ref 5.0–8.0)

## 2023-12-07 MED ORDER — CIPROFLOXACIN HCL 500 MG PO TABS: 500.0000 mg | ORAL_TABLET | Freq: Two times a day (BID) | ORAL | 0 refills | Status: AC

## 2023-12-07 NOTE — ED Triage Notes (Signed)
 Pt states he had a foley removed x5 days ago. Pt has had urgency, pain and burning since when urinating.

## 2023-12-07 NOTE — ED Provider Notes (Signed)
 [place] EMERGENCY DEPARTMENT AT [facility] Provider Note   CSN: [id number] Arrival date & time: 12/07/23  9048     Patient presents with: Urinary Frequency   Dylan [name] is a 63 y.o. male history of hypertension, ESRD status post transplant, prostate cancer status post ablation 7/23 at [facility] presents with complaints of dysuria with urgency.  Patient was discharged with a Foley x 2 weeks was removed on 8/4.  His symptoms started soon thereafter.  He was evaluated at urgent care on 8/6.  His UA and culture were consistent with a UTI.  He was discharged on Keflex  and Azo.  Reports compliance with this however no improvement of his symptoms.  Denies any abdominal pain, flank pain, nausea or vomiting.  No systemic symptoms.  Denies any penile discharge but does endorse hematuria and what appears to be clots.    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   Renal disorder    Uremia of renal origin 03/29/2016   Past Surgical History:  Procedure Laterality Date   BASCILIC VEIN TRANSPOSITION Left 04/06/2016   Procedure: Left Arm Single Stage Bascilic Vein Transposition;  Surgeon: Penne Lonni Colorado, MD;  Location: [facility] OR;  Service: Vascular;  Laterality: Left;   INSERTION OF DIALYSIS CATHETER Right 04/06/2016   Procedure: INSERTION OF Tunneled DIALYSIS CATHETER RIGHT INTERNAL JUGULAR;  Surgeon: Penne Lonni Colorado, MD;  Location: [facility] OR;  Service: Vascular;  Laterality: Right;   IR GENERIC HISTORICAL  06/26/2016   IR REMOVAL TUN CV CATH W/O FL 06/26/2016 Toribio Faes, MD  [facility]INTER[facility] RAD   NEPHRECTOMY TRANSPLANTED ORGAN     TOOTH EXTRACTION  2020       Prior to Admission medications   Medication Sig Start Date End Date Taking? Authorizing Provider  cephALEXin  (KEFLEX ) 500 MG capsule Take 1 capsule (500 mg total) by mouth 4 (four) times daily. 12/05/23   Leath-Warren, Etta PARAS, NP  chlorthalidone (HYGROTON) 25 MG tablet Take 1 tablet by mouth daily. 05/02/22 06/18/23  [provider]  doxycycline  (VIBRAMYCIN ) 100 MG capsule Take 1 capsule (100 mg total) by mouth 2 (two) times daily. 05/27/23   Raspet, Erin K, PA-C  ENVARSUS  XR 4 MG TB24 Take 4 mg by mouth daily.    [provider]  famotidine  (PEPCID ) 20 MG tablet TOME 1 TABLETA POR VIA ORAL TODOS LOS DIAS 08/16/23   Petrina Pries, NP  fluticasone  (FLONASE ) 50 MCG/ACT nasal spray Place 1 spray into both nostrils 2 (two) times daily. 05/13/22   Stuart Vernell Norris, PA-C  labetalol  (NORMODYNE ) 200 MG tablet TOME 1 TABLETA POR VIA ORAL DOS VECES AL DIA 10/29/23   Petrina Pries, NP  mycophenolate (CELLCEPT) 250 MG capsule Take 500 mg by mouth 2 (two) times daily.    [provider]  predniSONE  (DELTASONE ) 5 MG tablet Take 5 mg by mouth daily.    [provider]  rosuvastatin  (CRESTOR ) 10 MG tablet Take 1 tablet (10 mg total) by mouth daily. 12/15/22   Petrina Pries, NP  tamsulosin  (FLOMAX ) 0.4 MG CAPS capsule Take 1 capsule (0.4 mg total)  by mouth daily after supper. 12/15/22   Petrina Pries, NP  VITAMIN D-1000 MAX ST 25 MCG (1000 UT) tablet Take 1,000 Units by mouth daily. 05/02/22   [provider]    Allergies: Sulfamethoxazole-trimethoprim     Review of Systems  Genitourinary:  Positive for frequency.    Updated Vital Signs BP (!) 158/82   Pulse 74   Temp 98 F (36.7 C) (Oral)   Resp 16   Ht 5' 7 (1.702 m)   Wt 88 kg   SpO2 98%   BMI 30.39 kg/m   Physical Exam Vitals and nursing note reviewed.  Constitutional:      General: He is not in acute distress.     Appearance: He is well-developed.  HENT:     Head: Normocephalic and atraumatic.  Eyes:     Conjunctiva/sclera: Conjunctivae normal.  Cardiovascular:     Rate and Rhythm: Normal rate and regular rhythm.     Heart sounds: No murmur heard. Pulmonary:     Effort: Pulmonary effort is normal. No respiratory distress.     Breath sounds: Normal breath sounds.  Abdominal:     Palpations: Abdomen is soft.     Tenderness: There is no abdominal tenderness.  Musculoskeletal:        General: No swelling.     Cervical back: Neck supple.  Skin:    General: Skin is warm and dry.     Capillary Refill: Capillary refill takes less than 2 seconds.  Neurological:     Mental Status: He is alert.  Psychiatric:        Mood and Affect: Mood normal.     (all labs ordered are listed, but only abnormal results are displayed) Labs Reviewed  URINALYSIS, W/ REFLEX TO CULTURE (INFECTION SUSPECTED) - Abnormal; Notable for the following components:      Result Value   Color, Urine AMBER (*)    APPearance CLOUDY (*)    Hgb urine dipstick LARGE (*)    Protein, ur 100 (*)    Nitrite POSITIVE (*)    Leukocytes,Ua TRACE (*)    Non Squamous Epithelial 0-5 (*)    All other components within normal limits  CBC WITH DIFFERENTIAL/PLATELET - Abnormal; Notable for the following components:   WBC 11.0 (*)    RBC 3.97 (*)    Hemoglobin 11.4 (*)    HCT 36.2 (*)    Neutro Abs 9.0 (*)    Lymphs Abs 0.5 (*)    Monocytes Absolute 1.3 (*)    All other components within normal limits  COMPREHENSIVE METABOLIC PANEL WITH GFR - Abnormal; Notable for the following components:   Sodium 134 (*)    CO2 21 (*)    Glucose, Bld 140 (*)    BUN 43 (*)    Creatinine, Ser 2.13 (*)    AST 14 (*)    GFR, Estimated 34 (*)    All other components within normal limits  URINE CULTURE    EKG: None  Radiology: No results found.   Procedures   Medications Ordered in the ED - No data to display                                   Medical Decision Making Amount and/or Complexity of Data Reviewed Labs: ordered.   This patient presents to the ED with chief complaint(s) of dysuria.  The complaint involves an extensive differential diagnosis and  also carries with it a high risk of complications and morbidity.   Pertinent past medical history as listed in HPI  The differential diagnosis includes  Urinary retention, cystitis, pyelonephritis, prostatitis Additional history obtained: Additional history obtained from family Records reviewed Care Everywhere/External Records  Assessment and management:   Patient presents with complaints of urgency, dysuria x 4 days status post prostate ablation 7/23 with Foley removal on 8/4.  He currently being treated for UTI with Keflex  4 times daily and Azo.  He denies any abdominal pain flank pain.  His abdomen is nontender he has no CVAT.  His UA is consistent with a UTI today.  Will obtain repeat labs and bladder scan.  Bladder scan with 265.  However patient urinated twice 120 followed by 150.  Does have a mild leukocytosis of 11.  Otherwise his lab work does not show any significant abnormality.  His urine culture is positive for Klebsiella pneumonia and Morganella Morganii.  We do not have susceptibilities at this point.  Called the lab and will not have the results for at least another day.  Do not feel that patient warrants admission at this time.  Will send a prescription for Cipro , patient will follow-up with his urology team.  Strict return precautions provided.  Patient is understanding and agreement with plan.  Independent ECG interpretation:  none  Independent labs interpretation:  The following labs were independently interpreted:  CBC with leukocytosis of 11, hemoglobin stable 11.4, CMP with creatinine of 2.13 at baseline  Independent visualization and interpretation of imaging: I independently visualized the following imaging with scope of interpretation limited to  determining acute life threatening conditions related to emergency care: none    Consultations obtained:   none  Disposition:   Patient will be discharged home. The patient has been appropriately medically screened and/or stabilized in the ED. I have low suspicion for any other emergent medical condition which would require further screening, evaluation or treatment in the ED or require inpatient management. At time of discharge the patient is hemodynamically stable and in no acute distress. I have discussed work-up results and diagnosis with patient and answered all questions. Patient is agreeable with discharge plan. We discussed strict return precautions for returning to the emergency department and they verbalized understanding.     Social Determinants of Health:   none  This note was dictated with voice recognition software.  Despite best efforts at proofreading, errors may have occurred which can change the documentation meaning.       Final diagnoses:  Cystitis    ED Discharge Orders     None          Donnajean Lynwood VEAR DEVONNA 12/07/23 1411    Suzette Pac, MD 12/08/23 1659

## 2023-12-07 NOTE — ED Notes (Signed)
 Lab at bedside

## 2023-12-07 NOTE — Discharge Instructions (Signed)
 You were evaluated in the emergency room for dysuria.  Your lab work was consistent with a UTI.  A prescription for an alternate antibiotic was sent into your pharmacy.  Please be sure to complete the full course of antibiotics and follow-up with your urology team.

## 2023-12-08 LAB — URINE CULTURE: Culture: >=100000 — AB

## 2023-12-10 ENCOUNTER — Ambulatory Visit (HOSPITAL_COMMUNITY): Payer: Self-pay

## 2023-12-12 ENCOUNTER — Ambulatory Visit

## 2023-12-13 DIAGNOSIS — Z940 Kidney transplant status: Secondary | ICD-10-CM | POA: Diagnosis not present

## 2023-12-18 DIAGNOSIS — R399 Unspecified symptoms and signs involving the genitourinary system: Secondary | ICD-10-CM | POA: Diagnosis not present

## 2023-12-24 DIAGNOSIS — C61 Malignant neoplasm of prostate: Secondary | ICD-10-CM | POA: Diagnosis not present

## 2023-12-25 ENCOUNTER — Ambulatory Visit (INDEPENDENT_AMBULATORY_CARE_PROVIDER_SITE_OTHER): Payer: 59 | Admitting: Nurse Practitioner

## 2023-12-25 ENCOUNTER — Encounter: Payer: Self-pay | Admitting: Nurse Practitioner

## 2023-12-25 VITALS — BP 126/70 | HR 71 | Temp 98.5°F | Ht 67.0 in | Wt 201.1 lb

## 2023-12-25 DIAGNOSIS — R310 Gross hematuria: Secondary | ICD-10-CM | POA: Diagnosis not present

## 2023-12-25 DIAGNOSIS — E782 Mixed hyperlipidemia: Secondary | ICD-10-CM

## 2023-12-25 DIAGNOSIS — R21 Rash and other nonspecific skin eruption: Secondary | ICD-10-CM | POA: Diagnosis not present

## 2023-12-25 DIAGNOSIS — Z2821 Immunization not carried out because of patient refusal: Secondary | ICD-10-CM | POA: Diagnosis not present

## 2023-12-25 DIAGNOSIS — E66811 Obesity, class 1: Secondary | ICD-10-CM

## 2023-12-25 DIAGNOSIS — E6609 Other obesity due to excess calories: Secondary | ICD-10-CM | POA: Diagnosis not present

## 2023-12-25 DIAGNOSIS — C61 Malignant neoplasm of prostate: Secondary | ICD-10-CM

## 2023-12-25 DIAGNOSIS — I129 Hypertensive chronic kidney disease with stage 1 through stage 4 chronic kidney disease, or unspecified chronic kidney disease: Secondary | ICD-10-CM | POA: Diagnosis not present

## 2023-12-25 DIAGNOSIS — Z6831 Body mass index (BMI) 31.0-31.9, adult: Secondary | ICD-10-CM | POA: Diagnosis not present

## 2023-12-25 LAB — LIPID PANEL
Chol/HDL Ratio: 3.6 ratio (ref 0.0–5.0)
Cholesterol, Total: 147 mg/dL (ref 100–199)
HDL: 41 mg/dL (ref >39)
LDL Chol Calc (NIH): 75 mg/dL (ref 0–99)
Triglycerides: 185 mg/dL — ABNORMAL HIGH (ref 0–149)
VLDL Cholesterol Cal: 31 mg/dL (ref 5–40)

## 2023-12-25 LAB — CBC
Hematocrit: 36.4 % — ABNORMAL LOW (ref 37.5–51.0)
Hemoglobin: 11.6 g/dL — ABNORMAL LOW (ref 13.0–17.7)
MCH: 28.6 pg (ref 26.6–33.0)
MCHC: 31.9 g/dL (ref 31.5–35.7)
MCV: 90 fL (ref 79–97)
Platelets: 363 x10E3/uL (ref 150–450)
RBC: 4.06 x10E6/uL — ABNORMAL LOW (ref 4.14–5.80)
RDW: 13.8 % (ref 11.6–15.4)
WBC: 7.3 x10E3/uL (ref 3.4–10.8)

## 2023-12-25 NOTE — Assessment & Plan Note (Signed)
 He is encouraged to strive for BMI less than 30 to decrease cardiac risk. Advised to aim for at least 150 minutes of exercise per week.

## 2023-12-25 NOTE — Assessment & Plan Note (Addendum)
 Blood pressure is well controlled, continue current medications.  - Ensure adequate hydration.

## 2023-12-25 NOTE — Assessment & Plan Note (Signed)
 Moles flesh colored and skin tags present, no malignancy. Previous dermatology consultation for facial discoloration diagnosed as fungal infection. - Schedule follow-up with dermatologist for evaluation of skin lesions.

## 2023-12-25 NOTE — Assessment & Plan Note (Signed)
 Persistent gross hematuria with hemoglobin at 11.4 g/dL. He is being followed by Oncology. Lightheadedness possibly related to hematuria. - Recheck hemoglobin level. - Ensure adequate hydration.

## 2023-12-25 NOTE — Assessment & Plan Note (Signed)
 Recent UTI treated with antibiotics. - Discontinue tamsulosin  as it has not been refilled in a year. - continue f/u with Oncology

## 2023-12-25 NOTE — Progress Notes (Signed)
 LILLETTE Kristeen JINNY Gladis, CMA,acting as a Neurosurgeon for Gaines Ada, FNP.,have documented all relevant documentation on the behalf of Gaines Ada, FNP,as directed by  Gaines Ada, FNP while in the presence of Gaines Ada, FNP.  Subjective:  Patient ID: Dylan [name] , male    DOB: [birth year] , 63 y.o.   MRN: [id number]  Chief Complaint  Patient presents with   Hypertension    Patient presents today for a bp and chol follow up, Patient reports compliance with medication. Patient denies any chest pain, SOB, or headaches. Patient has no concerns today.      HPI  Discussed the use of AI scribe software for clinical note transcription with the patient, who gave verbal consent to proceed.  History of Present Illness Dylan [name] is a 63 year old male with hypertension who presents for a blood pressure follow-up. Interpreter is present during visit.  He takes rosuvastatin  for cholesterol management. He was previously on tamsulosin  for prostate relaxation but has not refilled it in a year. Recently, he was prescribed siladenafil.  He has a history of prostate cancer and underwent a prostate ablation procedure at [facility] in June of last year. Post-procedure recently, he developed a urinary tract infection treated with antibiotics. He continues to experience hematuria, which he was informed could persist for another month. He experienced lightheadedness two days ago while climbing stairs, raising concerns about his hemoglobin levels. His last hemoglobin check on August 8th showed a level of 11.4 g/dL. He confirms visible blood in his urine and denies swelling in his feet or ankles.  Over the past two months, he has noticed some hard spots on his face, which he attempts to scratch off. He previously visited a dermatologist in February for facial discoloration, diagnosed as a fungal infection. He describes the new spots as feeling like 'little moles' or 'wrinkles' near his nose.   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   Renal disorder    Uremia of renal origin 03/29/2016     Family History  Problem Relation Age of Onset   Breast cancer Mother    Prostate cancer Father    Diabetes Brother    Breast cancer Sister    Colon cancer Neg Hx    Esophageal cancer Neg Hx    Inflammatory bowel disease Neg Hx    Liver disease Neg Hx    Pancreatic cancer Neg Hx    Rectal cancer Neg Hx    Stomach cancer Neg Hx      Current Outpatient Medications:    cephALEXin  (KEFLEX ) 500 MG capsule, Take 1 capsule (500 mg total) by mouth 4 (four) times daily., Disp: 28 capsule, Rfl: 0   chlorthalidone (HYGROTON) 25 MG tablet, Take 1 tablet by mouth daily., Disp: , Rfl:    ciprofloxacin  (CIPRO ) 500 MG tablet, Take 1 tablet (500 mg total) by mouth every 12 (twelve) hours., Disp: 14 tablet, Rfl: 0   ENVARSUS  XR 4 MG TB24, Take 4 mg by mouth daily., Disp: , Rfl:    famotidine  (PEPCID ) 20 MG tablet, TOME 1 TABLETA POR VIA ORAL  TODOS LOS DIAS, Disp: 30 tablet, Rfl: 8   fluticasone  (FLONASE ) 50 MCG/ACT nasal spray, Place 1 spray into both nostrils 2 (two) times daily., Disp: 16 g, Rfl: 2   labetalol  (NORMODYNE ) 200 MG tablet, TOME 1 TABLETA POR VIA ORAL DOS VECES AL DIA, Disp: 180 tablet, Rfl: 1   mycophenolate (CELLCEPT) 250 MG capsule, Take 500 mg by mouth 2 (two) times daily., Disp: , Rfl:    predniSONE  (DELTASONE ) 5 MG tablet, Take 5 mg by mouth daily., Disp: , Rfl:    rosuvastatin  (CRESTOR ) 10 MG tablet, Take 1 tablet (10 mg total) by mouth daily., Disp: 90 tablet, Rfl: 1   VITAMIN D-1000 MAX ST 25 MCG (1000  UT) tablet, Take 1,000 Units by mouth daily., Disp: , Rfl:    Allergies  Allergen Reactions   Sulfamethoxazole-Trimethoprim  Diarrhea     Review of Systems  Constitutional:  Negative for chills, fatigue and fever.  HENT:  Positive for sore throat. Negative for ear pain.   Respiratory: Negative.  Negative for cough.   Cardiovascular: Negative.   Gastrointestinal: Negative.   Skin:        Hard areas to his face on nose and under his eye  Neurological: Negative.   Psychiatric/Behavioral: Negative.       Today's Vitals   12/25/23 0935  BP: 126/70  Pulse: 71  Temp: 98.5 F (36.9 C)  TempSrc: Oral  Weight: 201 lb 1.6 oz (91.2 kg)  Height: 5' 7 (1.702 m)  PainSc: 0-No pain   Body mass index is 31.5 kg/m.  Wt Readings from Last 3 Encounters:  12/25/23 201 lb 1.6 oz (91.2 kg)  12/07/23 194 lb 0.1 oz (88 kg)  06/18/23 199 lb 12.8 oz (90.6 kg)      Objective:  Physical Exam Vitals and nursing note reviewed.  Constitutional:      General: He is not in acute distress.    Appearance: Normal appearance.  HENT:     Head: Normocephalic.     Nose: Nose normal.     Mouth/Throat:     Mouth: Mucous membranes are moist.  Eyes:     General: Lids are normal.  Cardiovascular:     Rate and Rhythm: Normal rate and regular rhythm.     Pulses: Normal pulses.     Heart sounds: Normal heart sounds. No murmur heard. Pulmonary:     Effort: Pulmonary effort is normal. No respiratory distress.     Breath sounds: Normal breath sounds. No wheezing.  Skin:    General: Skin is warm.     Findings: No rash.     Comments: Has areas to his face near his nose difficult to get off for 2 months.  Neurological:     General: No focal deficit present.     Mental Status: He is alert and oriented to person, place, and time.         Assessment And Plan:  Hypertensive nephropathy Assessment & Plan: Blood pressure is well controlled, continue current medications.  - Ensure adequate  hydration.   Mixed hyperlipidemia Assessment & Plan: Cholesterol levels are stable. Continue focusing on low fat diet.   Orders: -     Lipid panel  Gross hematuria Assessment & Plan: Persistent gross hematuria with hemoglobin at 11.4 g/dL. He is being followed by Oncology. Lightheadedness possibly related to hematuria. - Recheck hemoglobin level. - Ensure adequate hydration.  Orders: -     CBC  Rash and nonspecific skin eruption Assessment & Plan: Moles  flesh colored and skin tags present, no malignancy. Previous dermatology consultation for facial discoloration diagnosed as fungal infection. - Schedule follow-up with dermatologist for evaluation of skin lesions.   COVID-19 vaccination declined  Herpes zoster vaccination declined  Class 1 obesity due to excess calories with serious comorbidity and body mass index (BMI) of 31.0 to 31.9 in adult Assessment & Plan: He is encouraged to strive for BMI less than 30 to decrease cardiac risk. Advised to aim for at least 150 minutes of exercise per week.    Cancer of prostate with intermediate recurrence risk (stage T2b-c or Gleason 7 or PSA 10-20) (HCC) Assessment & Plan: Recent UTI treated with antibiotics. - Discontinue tamsulosin  as it has not been refilled in a year. - continue f/u with Oncology     Return for 6 month bp check.  Patient was given opportunity to ask questions. Patient verbalized understanding of the plan and was able to repeat key elements of the plan. All questions were answered to their satisfaction.    LILLETTE Gaines Ada, FNP, have reviewed all documentation for this visit. The documentation on 12/25/23 for the exam, diagnosis, procedures, and orders are all accurate and complete.   IF YOU HAVE BEEN REFERRED TO A SPECIALIST, IT MAY TAKE 1-2 WEEKS TO SCHEDULE/PROCESS THE REFERRAL. IF YOU HAVE NOT HEARD FROM US /SPECIALIST IN TWO WEEKS, PLEASE GIVE US  A CALL AT [phone].

## 2023-12-25 NOTE — Assessment & Plan Note (Signed)
 Cholesterol levels are stable.  Continue focusing on low-fat diet.

## 2024-01-15 DIAGNOSIS — E875 Hyperkalemia: Secondary | ICD-10-CM | POA: Diagnosis not present

## 2024-01-15 DIAGNOSIS — Z23 Encounter for immunization: Secondary | ICD-10-CM | POA: Diagnosis not present

## 2024-01-15 DIAGNOSIS — R002 Palpitations: Secondary | ICD-10-CM | POA: Diagnosis not present

## 2024-01-15 DIAGNOSIS — Y830 Surgical operation with transplant of whole organ as the cause of abnormal reaction of the patient, or of later complication, without mention of misadventure at the time of the procedure: Secondary | ICD-10-CM | POA: Diagnosis not present

## 2024-01-15 DIAGNOSIS — C61 Malignant neoplasm of prostate: Secondary | ICD-10-CM | POA: Diagnosis not present

## 2024-01-15 DIAGNOSIS — Z79899 Other long term (current) drug therapy: Secondary | ICD-10-CM | POA: Diagnosis not present

## 2024-01-15 DIAGNOSIS — D84821 Immunodeficiency due to drugs: Secondary | ICD-10-CM | POA: Diagnosis not present

## 2024-01-15 DIAGNOSIS — Z940 Kidney transplant status: Secondary | ICD-10-CM | POA: Diagnosis not present

## 2024-01-15 DIAGNOSIS — N189 Chronic kidney disease, unspecified: Secondary | ICD-10-CM | POA: Diagnosis not present

## 2024-01-15 DIAGNOSIS — D631 Anemia in chronic kidney disease: Secondary | ICD-10-CM | POA: Diagnosis not present

## 2024-01-15 DIAGNOSIS — I10 Essential (primary) hypertension: Secondary | ICD-10-CM | POA: Diagnosis not present

## 2024-01-15 DIAGNOSIS — T8619 Other complication of kidney transplant: Secondary | ICD-10-CM | POA: Diagnosis not present

## 2024-01-15 DIAGNOSIS — I770 Arteriovenous fistula, acquired: Secondary | ICD-10-CM | POA: Diagnosis not present

## 2024-01-15 DIAGNOSIS — I129 Hypertensive chronic kidney disease with stage 1 through stage 4 chronic kidney disease, or unspecified chronic kidney disease: Secondary | ICD-10-CM | POA: Diagnosis not present

## 2024-01-28 ENCOUNTER — Ambulatory Visit
Admission: EM | Admit: 2024-01-28 | Discharge: 2024-01-28 | Disposition: A | Discharge status: Home / Self Care | Attending: Family Medicine | Admitting: Family Medicine

## 2024-01-28 DIAGNOSIS — N390 Urinary tract infection, site not specified: Secondary | ICD-10-CM | POA: Insufficient documentation

## 2024-01-28 LAB — POCT URINE DIPSTICK
Bilirubin, UA: NEGATIVE
Glucose, UA: NEGATIVE mg/dL
Ketones, POC UA: NEGATIVE mg/dL
Nitrite, UA: NEGATIVE
POC PROTEIN,UA: 100 — AB
Spec Grav, UA: 1.01 (ref 1.010–1.025)
Urobilinogen, UA: 0.2 U/dL
pH, UA: 8.5 — AB (ref 5.0–8.0)

## 2024-01-28 MED ORDER — CIPROFLOXACIN HCL 500 MG PO TABS: 500.0000 mg | ORAL_TABLET | Freq: Two times a day (BID) | ORAL | 0 refills | Status: AC

## 2024-01-28 NOTE — ED Provider Notes (Signed)
 [facility]    CSN: [id number] Arrival date & time: 01/28/24  0948      History   Chief Complaint Chief Complaint  Patient presents with   Urinary Frequency    HPI Dylan [name] is a 63 y.o. male.   Medical interpreter utilized today to facilitate visit with patient's consent.  Patient presenting today with 1 day history of dysuria, urinary frequency.  Denies fever, chills, flank pain, nausea, vomiting, hematuria.  So far not trying anything over-the-counter for symptoms.  History of urinary tract infections.    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   Renal disorder    Uremia of renal origin 03/29/2016    Patient Active Problem List   Diagnosis Date Noted   Gross hematuria 12/25/2023   COVID-19 vaccination declined 12/25/2023   Benign hypertension with CKD (chronic kidney disease) stage III (HCC) 06/25/2023   Herpes zoster vaccination declined 06/25/2023   Class 1 obesity due to excess calories without serious comorbidity with body mass index (BMI) of 32.0 to 32.9 in adult 06/25/2023   Discoloration of skin 06/25/2023   Cancer of prostate with intermediate recurrence risk (stage T2b-c or Gleason 7 or PSA 10-20) (HCC) 12/19/2022   Mixed hyperlipidemia 12/19/2022   Class 1 obesity due to excess calories with serious comorbidity and body mass index (BMI) of 31.0 to 31.9 in adult 12/19/2022   Kidney replaced by transplant 12/19/2022   Rash and nonspecific skin eruption 12/19/2022   Gastroesophageal reflux disease without esophagitis 12/19/2022    Seizure (HCC) 09/18/2019   Annual physical exam 04/17/2019   Screening for viral disease 04/17/2019   Primary osteoarthritis of right knee 01/14/2019   Primary osteoarthritis of left knee 01/14/2019   Effusion, right knee 01/14/2019   LVH (left ventricular hypertrophy) 12/27/2018   Anemia in chronic kidney disease 08/28/2018   Arteriovenous fistula, acquired 08/28/2018   Chronic pain of left ankle 07/04/2018   Bilateral chronic knee pain 07/04/2018   Hypertensive nephropathy 06/11/2018   Hyperkalemia, diminished renal excretion 03/29/2016   Uremia of renal origin 03/29/2016   HTN (hypertension) 03/29/2016   Metabolic acidosis, NAG, 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   History of anemia due to CKD 03/29/2016    Past Surgical History:  Procedure Laterality Date   BASCILIC VEIN TRANSPOSITION Left 04/06/2016   Procedure: Left Arm Single Stage Bascilic Vein Transposition;  Surgeon: Penne Lonni Colorado, MD;  Location: [facility] OR;  Service: Vascular;  Laterality: Left;   INSERTION OF DIALYSIS CATHETER Right 04/06/2016   Procedure: INSERTION OF Tunneled DIALYSIS CATHETER RIGHT INTERNAL JUGULAR;  Surgeon: Penne Lonni Colorado, MD;  Location: [facility] OR;  Service: Vascular;  Laterality: Right;   IR GENERIC HISTORICAL  06/26/2016   IR REMOVAL TUN CV CATH W/O FL 06/26/2016 Toribio Faes, MD [facility]   NEPHRECTOMY TRANSPLANTED ORGAN     TOOTH EXTRACTION  2020       Home Medications    Prior to Admission medications   Medication Sig Start Date End Date Taking? Authorizing Provider  ciprofloxacin  (CIPRO ) 500 MG tablet Take 1 tablet (500 mg  total) by mouth 2 (two) times daily. 01/28/24  Yes Stuart Vernell Norris, PA-C  cephALEXin  (KEFLEX ) 500 MG capsule Take 1 capsule (500 mg total) by mouth 4 (four) times daily. 12/05/23   Leath-Warren, Etta PARAS, NP  chlorthalidone (HYGROTON) 25 MG tablet Take 1 tablet by mouth daily. 05/02/22 12/25/23  [provider]  ciprofloxacin   (CIPRO ) 500 MG tablet Take 1 tablet (500 mg total) by mouth every 12 (twelve) hours. 12/07/23   Donnajean Lynwood DEL, PA-C  ENVARSUS  XR 4 MG TB24 Take 4 mg by mouth daily.    [provider]  famotidine  (PEPCID ) 20 MG tablet TOME 1 TABLETA POR VIA ORAL TODOS LOS DIAS 08/16/23   Petrina Pries, NP  fluticasone  (FLONASE ) 50 MCG/ACT nasal spray Place 1 spray into both nostrils 2 (two) times daily. 05/13/22   Stuart Vernell Norris, PA-C  labetalol  (NORMODYNE ) 200 MG tablet TOME 1 TABLETA POR VIA ORAL DOS VECES AL DIA 10/29/23   Petrina Pries, NP  mycophenolate (CELLCEPT) 250 MG capsule Take 500 mg by mouth 2 (two) times daily.    [provider]  predniSONE  (DELTASONE ) 5 MG tablet Take 5 mg by mouth daily.    [provider]  rosuvastatin  (CRESTOR ) 10 MG tablet Take 1 tablet (10 mg total) by mouth daily. 12/15/22   Petrina Pries, NP  VITAMIN D-1000 MAX ST 25 MCG (1000 UT) tablet Take 1,000 Units by mouth daily. 05/02/22   [provider]    Family History Family History  Problem Relation Age of Onset   Breast cancer Mother    Prostate cancer Father    Diabetes Brother    Breast cancer Sister    Colon cancer Neg Hx    Esophageal cancer Neg Hx    Inflammatory bowel disease Neg Hx    Liver disease Neg Hx    Pancreatic cancer Neg Hx    Rectal cancer Neg Hx    Stomach cancer Neg Hx     Social History Social History   Tobacco Use   Smoking status: Never   Smokeless tobacco: Never  Vaping Use   Vaping status: Never Used  Substance Use Topics   Alcohol use: No   Drug use: No     Allergies   Sulfamethoxazole-trimethoprim    Review of Systems Review of Systems Per HPI  Physical Exam Triage Vital Signs ED Triage Vitals  Encounter Vitals Group     BP 01/28/24 1002 136/78     Girls Systolic BP Percentile --      Girls Diastolic BP Percentile --      Boys Systolic BP Percentile --      Boys Diastolic BP Percentile --      Pulse Rate 01/28/24 1002 66      Resp 01/28/24 1002 16     Temp 01/28/24 1002 98 F (36.7 C)     Temp Source 01/28/24 1002 Oral     SpO2 01/28/24 1002 96 %     Weight --      Height --      Head Circumference --      Peak Flow --      Pain Score 01/28/24 1005 3     Pain Loc --      Pain Education --      Exclude from Growth Chart --    No data found.  Updated Vital Signs BP 136/78 (BP Location: Right Arm)   Pulse 66   Temp 98 F (36.7 C) (Oral)  Resp 16   SpO2 96%   Visual Acuity Right Eye Distance:   Left Eye Distance:   Bilateral Distance:    Right Eye Near:   Left Eye Near:    Bilateral Near:     Physical Exam Vitals and nursing note reviewed.  Constitutional:      Appearance: Normal appearance.  HENT:     Head: Atraumatic.  Eyes:     Extraocular Movements: Extraocular movements intact.     Conjunctiva/sclera: Conjunctivae normal.  Cardiovascular:     Rate and Rhythm: Normal rate.  Pulmonary:     Effort: Pulmonary effort is normal.  Abdominal:     General: Bowel sounds are normal. There is no distension.     Palpations: Abdomen is soft.     Tenderness: There is no abdominal tenderness. There is no right CVA tenderness, left CVA tenderness or guarding.  Musculoskeletal:        General: Normal range of motion.     Cervical back: Normal range of motion and neck supple.  Skin:    General: Skin is warm and dry.  Neurological:     General: No focal deficit present.     Mental Status: He is oriented to person, place, and time.  Psychiatric:        Mood and Affect: Mood normal.        Thought Content: Thought content normal.        Judgment: Judgment normal.      UC Treatments / Results  Labs (all labs ordered are listed, but only abnormal results are displayed) Labs Reviewed  POCT URINE DIPSTICK - Abnormal; Notable for the following components:      Result Value   Clarity, UA cloudy (*)    Blood, UA large (*)    pH, UA 8.5 (*)    POC PROTEIN,UA =100 (*)    Leukocytes, UA Large  (3+) (*)    All other components within normal limits  URINE CULTURE    EKG   Radiology No results found.  Procedures Procedures (including critical care time)  Medications Ordered in UC Medications - No data to display  Initial Impression / Assessment and Plan / UC Course  I have reviewed the triage vital signs and the nursing notes.  Pertinent labs & imaging results that were available during my care of the patient were reviewed by me and considered in my medical decision making (see chart for details).     Urinalysis today with evidence of urinary tract infection.  Will treat with Cipro , await urine culture and adjust if needed.  Push fluids, follow-up with PCP for recheck.  Final Clinical Impressions(s) / UC Diagnoses   Final diagnoses:  Acute lower UTI     Discharge Instructions      I have sent in a course of antibiotics to the pharmacy that you can start right away.  We have sent out your urine sample for a urine culture that will give us  more information about the bacteria causing your infection and let us  know if we have you on the right medication.  If we need to make any changes, someone will give you a call about this.  Drink plenty of fluids, try to empty your bladder fully and frequently and follow-up with your primary care provider for a recheck    ED Prescriptions     Medication Sig Dispense Auth. Provider   ciprofloxacin  (CIPRO ) 500 MG tablet Take 1 tablet (500 mg total) by mouth 2 (  two) times daily. 14 tablet Stuart Vernell Norris, [place]      PDMP not reviewed this encounter.   Stuart Vernell Norris, [place] 01/28/24 1110

## 2024-01-28 NOTE — ED Triage Notes (Signed)
 Burning with urination, urinary frequency x 1 day.

## 2024-01-28 NOTE — Discharge Instructions (Signed)
 I have sent in a course of antibiotics to the pharmacy that you can start right away.  We have sent out your urine sample for a urine culture that will give us  more information about the bacteria causing your infection and let us  know if we have you on the right medication.  If we need to make any changes, someone will give you a call about this.  Drink plenty of fluids, try to empty your bladder fully and frequently and follow-up with your primary care provider for a recheck

## 2024-01-30 ENCOUNTER — Ambulatory Visit (HOSPITAL_COMMUNITY): Payer: Self-pay

## 2024-01-30 LAB — URINE CULTURE: Culture: >=100000 — AB

## 2024-02-06 ENCOUNTER — Encounter: Payer: Self-pay | Admitting: Nurse Practitioner

## 2024-02-06 ENCOUNTER — Ambulatory Visit (INDEPENDENT_AMBULATORY_CARE_PROVIDER_SITE_OTHER): Admitting: Nurse Practitioner

## 2024-02-06 VITALS — BP 130/60 | HR 63 | Temp 98.5°F | Ht 67.0 in | Wt 198.0 lb

## 2024-02-06 DIAGNOSIS — N3000 Acute cystitis without hematuria: Secondary | ICD-10-CM | POA: Diagnosis not present

## 2024-02-06 LAB — POCT URINALYSIS DIP (CLINITEK)
Bilirubin, UA: NEGATIVE
Glucose, UA: NEGATIVE mg/dL
Ketones, POC UA: NEGATIVE mg/dL
Nitrite, UA: NEGATIVE
Spec Grav, UA: 1.015 (ref 1.010–1.025)
Urobilinogen, UA: 0.2 U/dL
pH, UA: 6 (ref 5.0–8.0)

## 2024-02-06 NOTE — Progress Notes (Signed)
 LILLETTE Kristeen JINNY Gladis, CMA,acting as a Neurosurgeon for Dylan Ada, FNP.,have documented all relevant documentation on the behalf of Dylan Ada, FNP,as directed by  Dylan Ada, FNP while in the presence of Dylan Ada, FNP.  Subjective:  Patient ID: Dylan [name] , male    DOB: 1960/10/20 , 63 y.o.   MRN: [id number]  Chief Complaint  Patient presents with   Prostatitis    Patient presents today for Prostatitis, patient reports he was having pain along with the UTI and unable to empty bladder. The prostatitis is the same symptoms as before.  He has had 2 surgeries for prostate cancer. Patient was recently diagnosed and treated for acute lower UTI on 01/28/24. He has finished 7 days worth of treatment and would like to be checked for UTI again. He reports he still has mucus in his urine but all other symptoms have subsided.    Discussed the use of AI scribe software for clinical note transcription with the patient, who gave verbal consent to proceed.  HPI  History of Present Illness Dylan [name] is a 63 year old male with a history of prostate surgery who presents with concerns of prostatitis and recurrent urinary tract infections. He is accompanied by his daughter, who is assisting with communication and providing additional information about his symptoms.  He has been experiencInterpreter Ragena is present during the visit.ing difficulty emptying his bladder, which he attributes to treatment following prostate surgery. Since the surgery, he has had two urinary tract infections within the past month. During the last infection, he experienced painful urination and was unable to completely empty his bladder.  He is concerned about the presence of mucus-like substances in his urine, although he is not currently experiencing pain or a burning sensation during urination. His urologist has scheduled a cystoscopy for October 13th to further investigate these issues.   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   Renal disorder    Uremia of renal origin 03/29/2016     Family History  Problem Relation Age of Onset   Breast cancer Mother    Prostate cancer Father    Diabetes Brother    Breast cancer Sister    Colon cancer Neg Hx    Esophageal cancer Neg Hx    Inflammatory bowel disease Neg Hx    Liver disease Neg Hx    Pancreatic cancer Neg Hx    Rectal cancer Neg Hx    Stomach cancer Neg Hx      Current Outpatient Medications:    cephALEXin  (KEFLEX ) 500 MG capsule, Take 1 capsule (500 mg total) by mouth 4 (four) times daily., Disp: 28 capsule, Rfl: 0   chlorthalidone (HYGROTON) 25 MG tablet, Take 1 tablet by mouth daily., Disp: , Rfl:    ciprofloxacin  (CIPRO ) 500 MG tablet, Take 1 tablet (500 mg total) by mouth every 12 (twelve) hours., Disp: 14 tablet, Rfl: 0   ciprofloxacin  (CIPRO ) 500 MG tablet, Take 1 tablet (500 mg total) by mouth 2 (two) times daily., Disp: 14 tablet, Rfl: 0   ENVARSUS  XR 4 MG TB24, Take 4  mg by mouth daily., Disp: , Rfl:    famotidine  (PEPCID ) 20 MG tablet, TOME 1 TABLETA POR VIA ORAL TODOS LOS DIAS, Disp: 30 tablet, Rfl: 8   fluticasone  (FLONASE ) 50 MCG/ACT nasal spray, Place 1 spray into both nostrils 2 (two) times daily., Disp: 16 g, Rfl: 2   labetalol  (NORMODYNE ) 200 MG tablet, TOME 1 TABLETA POR VIA ORAL DOS VECES AL DIA, Disp: 180 tablet, Rfl: 1   mycophenolate (CELLCEPT) 250 MG capsule, Take 500 mg by mouth 2 (two) times daily., Disp: , Rfl:    predniSONE  (DELTASONE ) 5 MG tablet, Take 5 mg by mouth daily., Disp: , Rfl:    rosuvastatin   (CRESTOR ) 10 MG tablet, Take 1 tablet (10 mg total) by mouth daily., Disp: 90 tablet, Rfl: 1   VITAMIN D-1000 MAX ST 25 MCG (1000 UT) tablet, Take 1,000 Units by mouth daily., Disp: , Rfl:    Allergies  Allergen Reactions   Sulfamethoxazole-Trimethoprim  Diarrhea     Review of Systems  Constitutional:  Negative for chills, fatigue and fever.  HENT:  Negative for ear pain and sore throat.   Respiratory: Negative.  Negative for cough.   Cardiovascular: Negative.   Gastrointestinal: Negative.   Skin:        Hard areas to his face on nose and under his eye  Neurological: Negative.   Psychiatric/Behavioral: Negative.       Today's Vitals   02/06/24 0919  BP: 130/60  Pulse: 63  Temp: 98.5 F (36.9 C)  TempSrc: Oral  Weight: 198 lb (89.8 kg)  Height: 5' 7 (1.702 m)  PainSc: 0-No pain   Body mass index is 31.01 kg/m.  Wt Readings from Last 3 Encounters:  02/06/24 198 lb (89.8 kg)  12/25/23 201 lb 1.6 oz (91.2 kg)  12/07/23 194 lb 0.1 oz (88 kg)     Objective:  Physical Exam Vitals and nursing note reviewed.  Constitutional:      General: He is not in acute distress.    Appearance: Normal appearance. He is obese.  HENT:     Head: Normocephalic.     Nose: Nose normal.     Mouth/Throat:     Mouth: Mucous membranes are moist.  Eyes:     General: Lids are normal.  Cardiovascular:     Rate and Rhythm: Normal rate and regular rhythm.     Pulses: Normal pulses.     Heart sounds: Normal heart sounds. No murmur heard. Pulmonary:     Effort: Pulmonary effort is normal. No respiratory distress.     Breath sounds: Normal breath sounds. No wheezing.  Skin:    General: Skin is warm.     Capillary Refill: Capillary refill takes less than 2 seconds.     Findings: No rash.  Neurological:     General: No focal deficit present.     Mental Status: He is alert and oriented to person, place, and time.     Cranial Nerves: No cranial nerve deficit.     Motor: No weakness.      Assessment And Plan:  Acute cystitis without hematuria Assessment & Plan: Recurrent UTIs with mucus-like discharge suggest possible prostatitis. - Send urine sample for urinalysis and culture. - Send results to urologist, Dr. Alyssa, before cystoscopy on October 13th. - Avoid antibiotics until culture results are available.  Orders: -     POCT URINALYSIS DIP (CLINITEK) -     Urine Culture     Return for keep same next.  Patient  was given opportunity to ask questions. Patient verbalized understanding of the plan and was able to repeat key elements of the plan. All questions were answered to their satisfaction.    LILLETTE Dylan Ada, FNP, have reviewed all documentation for this visit. The documentation on 02/06/24 for the exam, diagnosis, procedures, and orders are all accurate and complete.   IF YOU HAVE BEEN REFERRED TO A SPECIALIST, IT MAY TAKE 1-2 WEEKS TO SCHEDULE/PROCESS THE REFERRAL. IF YOU HAVE NOT HEARD FROM US /SPECIALIST IN TWO WEEKS, PLEASE GIVE US  A CALL AT [phone].

## 2024-02-07 LAB — URINE CULTURE

## 2024-02-11 DIAGNOSIS — C61 Malignant neoplasm of prostate: Secondary | ICD-10-CM | POA: Diagnosis not present

## 2024-02-17 ENCOUNTER — Ambulatory Visit: Payer: Self-pay | Admitting: Nurse Practitioner

## 2024-02-17 DIAGNOSIS — N3000 Acute cystitis without hematuria: Secondary | ICD-10-CM | POA: Insufficient documentation

## 2024-02-17 NOTE — Assessment & Plan Note (Signed)
 Recurrent UTIs with mucus-like discharge suggest possible prostatitis. - Send urine sample for urinalysis and culture. - Send results to urologist, Dr. Alyssa, before cystoscopy on October 13th. - Avoid antibiotics until culture results are available.

## 2024-02-25 ENCOUNTER — Ambulatory Visit: Admitting: Nurse Practitioner

## 2024-03-03 ENCOUNTER — Encounter: Payer: Self-pay | Admitting: Radiology

## 2024-03-05 ENCOUNTER — Ambulatory Visit: Admitting: Orthopaedic Surgery

## 2024-03-05 DIAGNOSIS — M1712 Unilateral primary osteoarthritis, left knee: Secondary | ICD-10-CM

## 2024-03-05 MED ORDER — BUPIVACAINE HCL 0.5 % IJ SOLN
2.0000 mL | INTRAMUSCULAR | Status: AC | PRN
  Administered 2024-03-05: 2 mL via INTRA_ARTICULAR

## 2024-03-05 MED ORDER — LIDOCAINE HCL 1 % IJ SOLN
2.0000 mL | INTRAMUSCULAR | Status: AC | PRN
  Administered 2024-03-05: 2 mL

## 2024-03-05 MED ORDER — METHYLPREDNISOLONE ACETATE 40 MG/ML IJ SUSP
40.0000 mg | INTRAMUSCULAR | Status: AC | PRN
  Administered 2024-03-05: 40 mg via INTRA_ARTICULAR

## 2024-03-05 NOTE — Progress Notes (Signed)
 Office Visit Note   Patient: Dylan [name]           Date of Birth: 1960-06-20           MRN: [id number] Visit Date: 03/05/2024              Requested by: Georgina Speaks, FNP [address] [place],  [place] [address] PCP: Georgina Speaks, FNP   Assessment & Plan: Visit Diagnoses:  1. Primary osteoarthritis of left knee     Plan: History of Present Illness Abdullah Rizzi is a 63 year old male who presents with left knee pain for follow-up.  He experiences persistent left knee pain, which was partially relieved by an injection in March for approximately six months. He applies Voltaren gel as part of his treatment. 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 He has a history of receiving cortisone injections in the left knee.  Physical Exam MUSCULOSKELETAL: Left knee effusion present.  Pain with range of motion due to the aspiration.  Collaterals and cruciates are stable.  Assessment and Plan Left knee primary osteoarthritis Chronic pain with effusion. Previous corticosteroid injection effective for six months. Prolonged standing exacerbates symptoms. - Administered corticosteroid injection to left knee. - Drained effusion from left knee.  30 cc obtained. - Discussed potential for decreased efficacy of future injections. - Advised on job modification to reduce knee strain.  Total face to face encounter time was greater than 25 minutes and over half of this time was spent in counseling and/or coordination of care.  Follow-Up Instructions: No follow-ups on file.   Orders:  Orders Placed This Encounter  Procedures   Large Joint Inj: L knee   No orders of the defined types were placed in this encounter.     Procedures: Large Joint Inj: L knee on 03/05/2024 10:22 AM Details: 22 G needle Medications: 2 mL bupivacaine  0.5 %; 2 mL lidocaine  1 %; 40 mg methylPREDNISolone  acetate 40 MG/ML Aspirate: 30 mL  clear Outcome: tolerated well, no immediate complications Patient was prepped and draped in the usual sterile fashion.       Clinical Data: No additional findings.   Subjective: Chief Complaint  Patient presents with   Left Knee - Pain    HPI  Review of Systems  Constitutional: Negative.   HENT: Negative.    Eyes: Negative.   Respiratory: Negative.    Cardiovascular: Negative.   Gastrointestinal: Negative.   Endocrine: Negative.   Genitourinary: Negative.   Skin: Negative.   Allergic/Immunologic: Negative.   Neurological: Negative.   Hematological: Negative.   Psychiatric/Behavioral: Negative.    All other systems reviewed and are negative.    Objective: Vital Signs: There were no vitals taken for this visit.  Physical Exam Vitals and nursing note reviewed.  Constitutional:      Appearance: He is well-developed.  HENT:     Head: Normocephalic and atraumatic.  Eyes:     Pupils: Pupils are equal, round, and reactive to light.  Pulmonary:     Effort: Pulmonary effort is normal.  Abdominal:     Palpations: Abdomen is soft.  Musculoskeletal:        General: Normal range of motion.     Cervical back: Neck supple.  Skin:    General: Skin is warm.  Neurological:     Mental Status: He is alert and oriented to person, place, and time.  Psychiatric:  Behavior: Behavior normal.        Thought Content: Thought content normal.        Judgment: Judgment normal.     Ortho Exam  Specialty Comments:  No specialty comments available.  Imaging: No results found.   PMFS History: Patient Active Problem List   Diagnosis Date Noted   Acute cystitis without hematuria 02/17/2024   Gross hematuria 12/25/2023   COVID-19 vaccination declined 12/25/2023   Benign hypertension with CKD (chronic kidney disease) stage III (HCC) 06/25/2023   Herpes zoster vaccination declined 06/25/2023   Class 1 obesity due to excess calories without serious comorbidity with  body mass index (BMI) of 32.0 to 32.9 in adult 06/25/2023   Discoloration of skin 06/25/2023   Cancer of prostate with intermediate recurrence risk (stage T2b-c or Gleason 7 or PSA 10-20) (HCC) 12/19/2022   Mixed hyperlipidemia 12/19/2022   Class 1 obesity due to excess calories with serious comorbidity and body mass index (BMI) of 31.0 to 31.9 in adult 12/19/2022   Kidney replaced by transplant 12/19/2022   Rash and nonspecific skin eruption 12/19/2022   Gastroesophageal reflux disease without esophagitis 12/19/2022   Seizure (HCC) 09/18/2019   Annual physical exam 04/17/2019   Screening for viral disease 04/17/2019   Primary osteoarthritis of right knee 01/14/2019   Primary osteoarthritis of left knee 01/14/2019   Effusion, right knee 01/14/2019   LVH (left ventricular hypertrophy) 12/27/2018   Anemia in chronic kidney disease 08/28/2018   Arteriovenous fistula, acquired 08/28/2018   Chronic pain of left ankle 07/04/2018   Bilateral chronic knee pain 07/04/2018   Hypertensive nephropathy 06/11/2018   Hyperkalemia, diminished renal excretion 03/29/2016   Uremia of renal origin 03/29/2016   HTN (hypertension) 03/29/2016   Metabolic acidosis, NAG, 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   History of anemia due to CKD 03/29/2016   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   Renal disorder    Uremia of renal origin 03/29/2016    Family History  Problem Relation Age of Onset   Breast cancer Mother    Prostate cancer Father    Diabetes Brother    Breast cancer Sister     Colon cancer Neg Hx    Esophageal cancer Neg Hx    Inflammatory bowel disease Neg Hx    Liver disease Neg Hx    Pancreatic cancer Neg Hx    Rectal cancer Neg Hx    Stomach cancer Neg Hx     Past Surgical History:  Procedure Laterality Date   BASCILIC VEIN TRANSPOSITION Left 04/06/2016   Procedure: Left Arm Single Stage Bascilic Vein Transposition;  Surgeon: Penne Lonni Colorado, MD;  Location: [facility] OR;  Service: Vascular;  Laterality: Left;   INSERTION OF DIALYSIS CATHETER Right 04/06/2016   Procedure: INSERTION OF Tunneled DIALYSIS CATHETER RIGHT INTERNAL JUGULAR;  Surgeon: Penne Lonni Colorado, MD;  Location: [facility] OR;  Service: Vascular;  Laterality: Right;   IR GENERIC HISTORICAL  06/26/2016   IR REMOVAL TUN CV CATH W/O FL 06/26/2016 Toribio Faes, MD [facility]   NEPHRECTOMY TRANSPLANTED ORGAN  TOOTH EXTRACTION  2020   Social History   Occupational History   Occupation: [employer]  Tobacco Use   Smoking status: Never   Smokeless tobacco: Never  Vaping Use   Vaping status: Never Used  Substance and Sexual Activity   Alcohol use: No   Drug use: No   Sexual activity: Yes    Birth control/protection: None

## 2024-03-10 ENCOUNTER — Encounter: Admitting: Urology

## 2024-04-08 DIAGNOSIS — L538 Other specified erythematous conditions: Secondary | ICD-10-CM | POA: Diagnosis not present

## 2024-04-08 DIAGNOSIS — L738 Other specified follicular disorders: Secondary | ICD-10-CM | POA: Diagnosis not present

## 2024-04-08 DIAGNOSIS — L820 Inflamed seborrheic keratosis: Secondary | ICD-10-CM | POA: Diagnosis not present

## 2024-04-08 DIAGNOSIS — R208 Other disturbances of skin sensation: Secondary | ICD-10-CM | POA: Diagnosis not present

## 2024-04-08 DIAGNOSIS — B360 Pityriasis versicolor: Secondary | ICD-10-CM | POA: Diagnosis not present

## 2024-04-10 DIAGNOSIS — Z940 Kidney transplant status: Secondary | ICD-10-CM | POA: Diagnosis not present

## 2024-04-10 DIAGNOSIS — D849 Immunodeficiency, unspecified: Secondary | ICD-10-CM | POA: Diagnosis not present

## 2024-04-10 DIAGNOSIS — Z5181 Encounter for therapeutic drug level monitoring: Secondary | ICD-10-CM | POA: Diagnosis not present

## 2024-04-10 DIAGNOSIS — C61 Malignant neoplasm of prostate: Secondary | ICD-10-CM | POA: Diagnosis not present

## 2024-04-12 ENCOUNTER — Other Ambulatory Visit: Payer: Self-pay | Admitting: Family Medicine

## 2024-04-12 DIAGNOSIS — K219 Gastro-esophageal reflux disease without esophagitis: Secondary | ICD-10-CM

## 2024-04-16 DIAGNOSIS — Z940 Kidney transplant status: Secondary | ICD-10-CM | POA: Diagnosis not present

## 2024-04-16 DIAGNOSIS — Z5181 Encounter for therapeutic drug level monitoring: Secondary | ICD-10-CM | POA: Diagnosis not present

## 2024-05-30 ENCOUNTER — Telehealth: Payer: Self-pay | Admitting: Family Medicine

## 2024-05-30 NOTE — Telephone Encounter (Signed)
 Copied from CRM #[id number]. Topic: Appointments - Transfer of Care >> May 30, 2024  3:23 PM Alfonso ORN wrote: Pt is requesting to transfer FROM: [facility] Pt is requesting to transfer TO: [facility]  Reason for requested transfer: due to insurance will not in network wth , [name]  It is the responsibility of the team the patient would like to transfer to (Dr. Severa, [facility]) to reach out to the patient if for any reason this transfer is not acceptable.   ----------------------------------------------------------------------- From previous Reason for Contact - Scheduling: Patient/patient representative is calling to schedule an appointment. Refer to attachments for appointment information.

## 2024-06-26 ENCOUNTER — Encounter: Payer: Self-pay | Admitting: Nurse Practitioner

## 2024-06-26 ENCOUNTER — Encounter: Admitting: Nurse Practitioner

## 2024-12-11 ENCOUNTER — Encounter: Payer: PRIVATE HEALTH INSURANCE | Admitting: Family Medicine
